# Patient Record
Sex: Female | Born: 1935
Health system: Southern US, Community
[De-identification: ages and names within clinical notes are randomized; demographics above are authoritative.]

## PROBLEM LIST (undated history)

## (undated) DIAGNOSIS — E876 Hypokalemia: Secondary | ICD-10-CM

## (undated) DIAGNOSIS — J310 Chronic rhinitis: Secondary | ICD-10-CM

## (undated) DIAGNOSIS — F329 Major depressive disorder, single episode, unspecified: Secondary | ICD-10-CM

## (undated) DIAGNOSIS — C801 Malignant (primary) neoplasm, unspecified: Secondary | ICD-10-CM

## (undated) DIAGNOSIS — I739 Peripheral vascular disease, unspecified: Secondary | ICD-10-CM

## (undated) DIAGNOSIS — E785 Hyperlipidemia, unspecified: Secondary | ICD-10-CM

## (undated) DIAGNOSIS — G4700 Insomnia, unspecified: Secondary | ICD-10-CM

## (undated) DIAGNOSIS — F419 Anxiety disorder, unspecified: Secondary | ICD-10-CM

## (undated) DIAGNOSIS — F0390 Unspecified dementia without behavioral disturbance: Secondary | ICD-10-CM

## (undated) DIAGNOSIS — K219 Gastro-esophageal reflux disease without esophagitis: Secondary | ICD-10-CM

## (undated) DIAGNOSIS — R609 Edema, unspecified: Secondary | ICD-10-CM

## (undated) DIAGNOSIS — D649 Anemia, unspecified: Secondary | ICD-10-CM

## (undated) DIAGNOSIS — I10 Essential (primary) hypertension: Secondary | ICD-10-CM

## (undated) DIAGNOSIS — G629 Polyneuropathy, unspecified: Secondary | ICD-10-CM

## (undated) DIAGNOSIS — I679 Cerebrovascular disease, unspecified: Secondary | ICD-10-CM

## (undated) DIAGNOSIS — J449 Chronic obstructive pulmonary disease, unspecified: Secondary | ICD-10-CM

## (undated) DIAGNOSIS — M545 Low back pain: Secondary | ICD-10-CM

## (undated) DIAGNOSIS — L03119 Cellulitis of unspecified part of limb: Secondary | ICD-10-CM

## (undated) DIAGNOSIS — L02419 Cutaneous abscess of limb, unspecified: Secondary | ICD-10-CM

## (undated) DIAGNOSIS — M1990 Unspecified osteoarthritis, unspecified site: Secondary | ICD-10-CM

## (undated) DIAGNOSIS — I639 Cerebral infarction, unspecified: Secondary | ICD-10-CM

## (undated) HISTORY — PX: JOINT REPLACEMENT: SHX530

## (undated) HISTORY — DX: Cutaneous abscess of limb, unspecified: L02.419

## (undated) HISTORY — DX: Major depressive disorder, single episode, unspecified: F32.9

## (undated) HISTORY — DX: Polyneuropathy, unspecified: G62.9

## (undated) HISTORY — DX: Hyperlipidemia, unspecified: E78.5

## (undated) HISTORY — DX: Chronic rhinitis: J31.0

## (undated) HISTORY — DX: Anemia, unspecified: D64.9

## (undated) HISTORY — DX: Cerebrovascular disease, unspecified: I67.9

## (undated) HISTORY — DX: Edema, unspecified: R60.9

## (undated) HISTORY — DX: Essential (primary) hypertension: I10

## (undated) HISTORY — DX: Cellulitis of unspecified part of limb: L03.119

## (undated) HISTORY — PX: BREAST SURGERY: SHX581

## (undated) HISTORY — DX: Unspecified osteoarthritis, unspecified site: M19.90

## (undated) HISTORY — DX: Peripheral vascular disease, unspecified: I73.9

## (undated) HISTORY — DX: Gastro-esophageal reflux disease without esophagitis: K21.9

## (undated) HISTORY — DX: Anxiety disorder, unspecified: F41.9

## (undated) HISTORY — DX: Hypokalemia: E87.6

## (undated) HISTORY — DX: Low back pain: M54.5

---

## 1999-12-14 DIAGNOSIS — J310 Chronic rhinitis: Secondary | ICD-10-CM

## 1999-12-14 HISTORY — DX: Chronic rhinitis: J31.0

## 2003-08-05 DIAGNOSIS — I739 Peripheral vascular disease, unspecified: Secondary | ICD-10-CM

## 2003-08-05 DIAGNOSIS — L02419 Cutaneous abscess of limb, unspecified: Secondary | ICD-10-CM

## 2003-08-05 DIAGNOSIS — L03119 Cellulitis of unspecified part of limb: Secondary | ICD-10-CM

## 2003-08-05 DIAGNOSIS — R609 Edema, unspecified: Secondary | ICD-10-CM

## 2003-08-05 DIAGNOSIS — E876 Hypokalemia: Secondary | ICD-10-CM

## 2003-08-05 DIAGNOSIS — M545 Low back pain, unspecified: Secondary | ICD-10-CM

## 2003-08-05 DIAGNOSIS — F419 Anxiety disorder, unspecified: Secondary | ICD-10-CM

## 2003-08-05 DIAGNOSIS — I679 Cerebrovascular disease, unspecified: Secondary | ICD-10-CM

## 2003-08-05 HISTORY — DX: Low back pain, unspecified: M54.50

## 2003-08-05 HISTORY — DX: Cellulitis of unspecified part of limb: L03.119

## 2003-08-05 HISTORY — DX: Anxiety disorder, unspecified: F41.9

## 2003-08-05 HISTORY — DX: Hypokalemia: E87.6

## 2003-08-05 HISTORY — DX: Peripheral vascular disease, unspecified: I73.9

## 2003-08-05 HISTORY — DX: Edema, unspecified: R60.9

## 2003-08-05 HISTORY — DX: Cutaneous abscess of limb, unspecified: L02.419

## 2003-08-05 HISTORY — DX: Cerebrovascular disease, unspecified: I67.9

## 2003-11-18 ENCOUNTER — Emergency Department (HOSPITAL_COMMUNITY): Admission: EM | Admit: 2003-11-18 | Discharge: 2003-11-18 | Payer: Self-pay | Admitting: Emergency Medicine

## 2004-01-06 ENCOUNTER — Ambulatory Visit (HOSPITAL_COMMUNITY): Admission: RE | Admit: 2004-01-06 | Discharge: 2004-01-06 | Payer: Self-pay | Admitting: Unknown Physician Specialty

## 2004-01-28 ENCOUNTER — Encounter (INDEPENDENT_AMBULATORY_CARE_PROVIDER_SITE_OTHER): Payer: Self-pay | Admitting: *Deleted

## 2004-01-28 ENCOUNTER — Ambulatory Visit (HOSPITAL_COMMUNITY): Admission: RE | Admit: 2004-01-28 | Discharge: 2004-01-28 | Payer: Self-pay | Admitting: Internal Medicine

## 2004-02-21 ENCOUNTER — Ambulatory Visit (HOSPITAL_COMMUNITY): Admission: RE | Admit: 2004-02-21 | Discharge: 2004-02-21 | Payer: Self-pay | Admitting: Gastroenterology

## 2004-03-11 ENCOUNTER — Emergency Department (HOSPITAL_COMMUNITY): Admission: EM | Admit: 2004-03-11 | Discharge: 2004-03-11 | Payer: Self-pay

## 2004-04-06 ENCOUNTER — Emergency Department (HOSPITAL_COMMUNITY): Admission: EM | Admit: 2004-04-06 | Discharge: 2004-04-06 | Payer: Self-pay | Admitting: Emergency Medicine

## 2004-04-15 ENCOUNTER — Ambulatory Visit (HOSPITAL_COMMUNITY): Admission: RE | Admit: 2004-04-15 | Discharge: 2004-04-15 | Payer: Self-pay | Admitting: Unknown Physician Specialty

## 2004-06-19 ENCOUNTER — Ambulatory Visit: Payer: Self-pay | Admitting: Internal Medicine

## 2004-08-23 ENCOUNTER — Emergency Department (HOSPITAL_COMMUNITY): Admission: EM | Admit: 2004-08-23 | Discharge: 2004-08-23 | Payer: Self-pay | Admitting: Emergency Medicine

## 2005-06-05 ENCOUNTER — Inpatient Hospital Stay (HOSPITAL_COMMUNITY): Admission: EM | Admit: 2005-06-05 | Discharge: 2005-06-10 | Payer: Self-pay | Admitting: Emergency Medicine

## 2005-12-01 ENCOUNTER — Ambulatory Visit (HOSPITAL_COMMUNITY): Admission: RE | Admit: 2005-12-01 | Discharge: 2005-12-01 | Payer: Self-pay | Admitting: *Deleted

## 2006-06-13 ENCOUNTER — Ambulatory Visit (HOSPITAL_COMMUNITY): Admission: RE | Admit: 2006-06-13 | Discharge: 2006-06-13 | Payer: Self-pay | Admitting: *Deleted

## 2006-07-20 ENCOUNTER — Ambulatory Visit (HOSPITAL_COMMUNITY): Admission: RE | Admit: 2006-07-20 | Discharge: 2006-07-20 | Payer: Self-pay | Admitting: Gastroenterology

## 2007-02-20 ENCOUNTER — Inpatient Hospital Stay (HOSPITAL_COMMUNITY): Admission: EM | Admit: 2007-02-20 | Discharge: 2007-02-24 | Payer: Self-pay | Admitting: Emergency Medicine

## 2007-07-21 ENCOUNTER — Ambulatory Visit (HOSPITAL_COMMUNITY): Admission: RE | Admit: 2007-07-21 | Discharge: 2007-07-21 | Payer: Self-pay | Admitting: *Deleted

## 2008-04-09 ENCOUNTER — Encounter: Admission: RE | Admit: 2008-04-09 | Discharge: 2008-04-09 | Payer: Self-pay | Admitting: Internal Medicine

## 2008-07-09 ENCOUNTER — Ambulatory Visit (HOSPITAL_COMMUNITY): Admission: RE | Admit: 2008-07-09 | Discharge: 2008-07-09 | Payer: Self-pay | Admitting: Orthopedic Surgery

## 2008-07-15 ENCOUNTER — Inpatient Hospital Stay (HOSPITAL_COMMUNITY): Admission: RE | Admit: 2008-07-15 | Discharge: 2008-07-18 | Payer: Self-pay | Admitting: Orthopedic Surgery

## 2008-07-18 DIAGNOSIS — G629 Polyneuropathy, unspecified: Secondary | ICD-10-CM

## 2008-07-18 DIAGNOSIS — C801 Malignant (primary) neoplasm, unspecified: Secondary | ICD-10-CM

## 2008-07-18 DIAGNOSIS — M1990 Unspecified osteoarthritis, unspecified site: Secondary | ICD-10-CM

## 2008-07-18 HISTORY — PX: EYE SURGERY: SHX253

## 2008-07-18 HISTORY — DX: Malignant (primary) neoplasm, unspecified: C80.1

## 2008-07-18 HISTORY — DX: Unspecified osteoarthritis, unspecified site: M19.90

## 2008-07-18 HISTORY — PX: FRACTURE SURGERY: SHX138

## 2008-07-18 HISTORY — DX: Polyneuropathy, unspecified: G62.9

## 2008-10-30 ENCOUNTER — Ambulatory Visit (HOSPITAL_COMMUNITY): Admission: RE | Admit: 2008-10-30 | Discharge: 2008-10-30 | Payer: Self-pay | Admitting: Internal Medicine

## 2009-03-15 HISTORY — PX: CAROTID ENDARTERECTOMY: SUR193

## 2009-04-30 ENCOUNTER — Encounter (INDEPENDENT_AMBULATORY_CARE_PROVIDER_SITE_OTHER): Payer: Self-pay | Admitting: *Deleted

## 2009-05-07 ENCOUNTER — Encounter: Admission: RE | Admit: 2009-05-07 | Discharge: 2009-05-07 | Payer: Self-pay | Admitting: Internal Medicine

## 2009-06-18 ENCOUNTER — Encounter: Admission: RE | Admit: 2009-06-18 | Discharge: 2009-06-18 | Payer: Self-pay | Admitting: Internal Medicine

## 2009-10-10 ENCOUNTER — Inpatient Hospital Stay (HOSPITAL_COMMUNITY): Admission: EM | Admit: 2009-10-10 | Discharge: 2009-10-12 | Payer: Self-pay | Admitting: Emergency Medicine

## 2009-10-11 ENCOUNTER — Encounter (INDEPENDENT_AMBULATORY_CARE_PROVIDER_SITE_OTHER): Payer: Self-pay | Admitting: Internal Medicine

## 2009-10-12 DIAGNOSIS — F32A Depression, unspecified: Secondary | ICD-10-CM

## 2009-10-12 HISTORY — DX: Depression, unspecified: F32.A

## 2009-12-02 ENCOUNTER — Inpatient Hospital Stay (HOSPITAL_COMMUNITY): Admission: EM | Admit: 2009-12-02 | Discharge: 2009-12-06 | Payer: Self-pay | Admitting: Emergency Medicine

## 2009-12-03 ENCOUNTER — Encounter (INDEPENDENT_AMBULATORY_CARE_PROVIDER_SITE_OTHER): Payer: Self-pay | Admitting: Internal Medicine

## 2009-12-04 ENCOUNTER — Ambulatory Visit: Payer: Self-pay | Admitting: Vascular Surgery

## 2009-12-04 ENCOUNTER — Encounter (INDEPENDENT_AMBULATORY_CARE_PROVIDER_SITE_OTHER): Payer: Self-pay | Admitting: Internal Medicine

## 2009-12-05 ENCOUNTER — Ambulatory Visit: Payer: Self-pay | Admitting: Internal Medicine

## 2009-12-05 ENCOUNTER — Encounter (INDEPENDENT_AMBULATORY_CARE_PROVIDER_SITE_OTHER): Payer: Self-pay | Admitting: Internal Medicine

## 2009-12-16 ENCOUNTER — Emergency Department (HOSPITAL_COMMUNITY): Admission: EM | Admit: 2009-12-16 | Discharge: 2009-12-16 | Payer: Self-pay | Admitting: Emergency Medicine

## 2009-12-24 ENCOUNTER — Ambulatory Visit: Payer: Self-pay | Admitting: Vascular Surgery

## 2010-04-05 ENCOUNTER — Encounter: Payer: Self-pay | Admitting: Internal Medicine

## 2010-04-05 ENCOUNTER — Encounter (HOSPITAL_BASED_OUTPATIENT_CLINIC_OR_DEPARTMENT_OTHER): Payer: Self-pay | Admitting: Internal Medicine

## 2010-04-14 NOTE — Letter (Signed)
Summary: New Patient letter  [facility]  [address] [place], [place] [address]   Phone: [phone]  Fax: [phone]       04/30/2009 MRN: [id number]  Dorothy Collins [address] [place], [place]  [address]  Dear Dorothy Collins,  Welcome to the Gastroenterology Division at [facility].    You are scheduled to see Dr.  Melvia Heaps on June 02, 2009 at 9:00am on the 3rd floor at [facility], [address].  We ask that you try to arrive at our office 15 minutes prior to your appointment time to allow for check-in.  We would like you to complete the enclosed self-administered evaluation form prior to your visit and bring it with you on the day of your appointment.  We will review it with you.  Also, please bring a complete list of all your medications or, if you prefer, bring the medication bottles and we will list them.  Please bring your insurance card so that we may make a copy of it.  If your insurance requires a referral to see a specialist, please bring your referral form from your primary care physician.  Co-payments are due at the time of your visit and may be paid by cash, check or credit card.     Your office visit will consist of a consult with your physician (includes a physical exam), any laboratory testing he/she may order, scheduling of any necessary diagnostic testing (e.g. x-ray, ultrasound, CT-scan), and scheduling of a procedure (e.g. Endoscopy, Colonoscopy) if required.  Please allow enough time on your schedule to allow for any/all of these possibilities.    If you cannot keep your appointment, please call [phone] to cancel or reschedule prior to your appointment date.  This allows [place] the opportunity to schedule an appointment for another patient in need of care.  If you do not cancel or reschedule by 5 p.m. the business day prior to your appointment date, you will be charged a $50.00 late cancellation/no-show fee.    Thank you for  choosing [facility] for your medical needs.  We appreciate the opportunity to care for you.  Please visit [place] at our website  to learn more about our practice.                     Sincerely,                                                             The Gastroenterology Division

## 2010-04-17 ENCOUNTER — Other Ambulatory Visit (HOSPITAL_BASED_OUTPATIENT_CLINIC_OR_DEPARTMENT_OTHER): Payer: Self-pay | Admitting: Internal Medicine

## 2010-04-17 DIAGNOSIS — Z1231 Encounter for screening mammogram for malignant neoplasm of breast: Secondary | ICD-10-CM

## 2010-05-04 ENCOUNTER — Encounter (INDEPENDENT_AMBULATORY_CARE_PROVIDER_SITE_OTHER): Payer: Medicaid Other

## 2010-05-04 ENCOUNTER — Encounter: Payer: Self-pay | Admitting: Internal Medicine

## 2010-05-04 DIAGNOSIS — R0609 Other forms of dyspnea: Secondary | ICD-10-CM

## 2010-05-04 DIAGNOSIS — R0989 Other specified symptoms and signs involving the circulatory and respiratory systems: Secondary | ICD-10-CM

## 2010-05-06 ENCOUNTER — Encounter: Payer: Self-pay | Admitting: Internal Medicine

## 2010-05-11 ENCOUNTER — Ambulatory Visit: Payer: Self-pay

## 2010-05-13 ENCOUNTER — Ambulatory Visit: Payer: Self-pay

## 2010-05-21 NOTE — Assessment & Plan Note (Signed)
Summary: rule out asthma-pft only- refer. dr Marijo Conception per traci at [facility]...    Other Orders: Carbon Monoxide diffusing w/capacity (98119) Lung Volumes/Gas dilution or washout (14782) Spirometry (Pre & Post) [phone])

## 2010-05-27 LAB — URINALYSIS, ROUTINE W REFLEX MICROSCOPIC
Bilirubin Urine: NEGATIVE
Ketones, ur: NEGATIVE mg/dL
Nitrite: POSITIVE — AB
Protein, ur: NEGATIVE mg/dL
Urobilinogen, UA: 0.2 mg/dL (ref 0.0–1.0)

## 2010-05-27 LAB — URINE CULTURE
Colony Count: >=100000
Culture  Setup Time: 201110050116

## 2010-05-27 LAB — DIFFERENTIAL
Basophils Absolute: 0 10*3/uL (ref 0.0–0.1)
Eosinophils Absolute: 0.2 10*3/uL (ref 0.0–0.7)
Eosinophils Relative: 2 % (ref 0–5)
Lymphocytes Relative: 14 % (ref 12–46)
Neutrophils Relative %: 82 % — ABNORMAL HIGH (ref 43–77)

## 2010-05-27 LAB — BASIC METABOLIC PANEL
BUN: 7 mg/dL (ref 6–23)
Calcium: 9.4 mg/dL (ref 8.4–10.5)
Chloride: 98 mEq/L (ref 96–112)
Creatinine, Ser: 0.52 mg/dL (ref 0.4–1.2)

## 2010-05-27 LAB — CBC
MCH: 30.8 pg (ref 26.0–34.0)
MCV: 89.6 fL (ref 78.0–100.0)
Platelets: 409 10*3/uL — ABNORMAL HIGH (ref 150–400)
RDW: 12.6 % (ref 11.5–15.5)
WBC: 9.9 10*3/uL (ref 4.0–10.5)

## 2010-05-27 LAB — SEDIMENTATION RATE: Sed Rate: 28 mm/hr — ABNORMAL HIGH (ref 0–22)

## 2010-05-28 LAB — URINALYSIS, ROUTINE W REFLEX MICROSCOPIC
Glucose, UA: NEGATIVE mg/dL
Hgb urine dipstick: NEGATIVE
Hgb urine dipstick: NEGATIVE
Ketones, ur: NEGATIVE mg/dL
Nitrite: NEGATIVE
Protein, ur: NEGATIVE mg/dL
Protein, ur: NEGATIVE mg/dL
Specific Gravity, Urine: 1.007 (ref 1.005–1.030)
Urobilinogen, UA: 0.2 mg/dL (ref 0.0–1.0)

## 2010-05-28 LAB — COMPREHENSIVE METABOLIC PANEL
AST: 26 U/L (ref 0–37)
Albumin: 3.6 g/dL (ref 3.5–5.2)
BUN: 10 mg/dL (ref 6–23)
CO2: 29 mEq/L (ref 19–32)
Calcium: 8.8 mg/dL (ref 8.4–10.5)
Creatinine, Ser: 0.63 mg/dL (ref 0.4–1.2)
Creatinine, Ser: 0.75 mg/dL (ref 0.4–1.2)
GFR calc Af Amer: >60 mL/min (ref >60)
GFR calc non Af Amer: >60 mL/min (ref >60)
Glucose, Bld: 90 mg/dL (ref 70–99)
Total Bilirubin: 0.2 mg/dL — ABNORMAL LOW (ref 0.3–1.2)
Total Protein: 6.2 g/dL (ref 6.0–8.3)
Total Protein: 6.5 g/dL (ref 6.0–8.3)

## 2010-05-28 LAB — CK TOTAL AND CKMB (NOT AT ARMC)
CK, MB: 1.5 ng/mL (ref 0.3–4.0)
Relative Index: INVALID (ref 0.0–2.5)
Relative Index: INVALID (ref 0.0–2.5)
Total CK: 38 U/L (ref 7–177)
Total CK: 43 U/L (ref 7–177)

## 2010-05-28 LAB — MRSA PCR SCREENING: MRSA by PCR: NEGATIVE

## 2010-05-28 LAB — BASIC METABOLIC PANEL
BUN: 3 mg/dL — ABNORMAL LOW (ref 6–23)
CO2: 30 mEq/L (ref 19–32)
CO2: 31 mEq/L (ref 19–32)
Calcium: 8.5 mg/dL (ref 8.4–10.5)
Calcium: 8.8 mg/dL (ref 8.4–10.5)
Creatinine, Ser: 0.49 mg/dL (ref 0.4–1.2)
GFR calc Af Amer: >60 mL/min (ref >60)
GFR calc Af Amer: >60 mL/min (ref >60)
GFR calc non Af Amer: >60 mL/min (ref >60)
GFR calc non Af Amer: >60 mL/min (ref >60)
GFR calc non Af Amer: >60 mL/min (ref >60)
Glucose, Bld: 106 mg/dL — ABNORMAL HIGH (ref 70–99)
Glucose, Bld: 145 mg/dL — ABNORMAL HIGH (ref 70–99)
Potassium: 4.1 mEq/L (ref 3.5–5.1)
Sodium: 136 mEq/L (ref 135–145)
Sodium: 137 mEq/L (ref 135–145)

## 2010-05-28 LAB — CBC
HCT: 37.5 % (ref 36.0–46.0)
HCT: 38.6 % (ref 36.0–46.0)
Hemoglobin: 12.3 g/dL (ref 12.0–15.0)
MCH: 30.3 pg (ref 26.0–34.0)
MCHC: 33.3 g/dL (ref 30.0–36.0)
MCHC: 33.9 g/dL (ref 30.0–36.0)
MCV: 87.9 fL (ref 78.0–100.0)
Platelets: 224 10*3/uL (ref 150–400)
Platelets: 240 10*3/uL (ref 150–400)
RBC: 4.39 MIL/uL (ref 3.87–5.11)
RDW: 12.6 % (ref 11.5–15.5)
RDW: 12.6 % (ref 11.5–15.5)
RDW: 12.6 % (ref 11.5–15.5)
WBC: 7.2 10*3/uL (ref 4.0–10.5)
WBC: 7.5 10*3/uL (ref 4.0–10.5)

## 2010-05-28 LAB — URINE CULTURE
Colony Count: NO GROWTH
Culture: NO GROWTH

## 2010-05-28 LAB — DIFFERENTIAL
Basophils Absolute: 0.1 10*3/uL (ref 0.0–0.1)
Lymphocytes Relative: 29 % (ref 12–46)
Lymphocytes Relative: 30 % (ref 12–46)
Lymphs Abs: 2.6 10*3/uL (ref 0.7–4.0)
Monocytes Absolute: 0.5 10*3/uL (ref 0.1–1.0)
Monocytes Relative: 7 % (ref 3–12)
Neutro Abs: 4.1 10*3/uL (ref 1.7–7.7)
Neutrophils Relative %: 58 % (ref 43–77)

## 2010-05-28 LAB — TROPONIN I: Troponin I: 0.02 ng/mL (ref 0.00–0.06)

## 2010-05-28 LAB — PROTIME-INR
INR: 0.92 (ref 0.00–1.49)
INR: 0.93 (ref 0.00–1.49)
Prothrombin Time: 12.6 seconds (ref 11.6–15.2)
Prothrombin Time: 12.7 seconds (ref 11.6–15.2)

## 2010-05-28 LAB — GLUCOSE, CAPILLARY
Glucose-Capillary: 105 mg/dL — ABNORMAL HIGH (ref 70–99)
Glucose-Capillary: 150 mg/dL — ABNORMAL HIGH (ref 70–99)
Glucose-Capillary: 155 mg/dL — ABNORMAL HIGH (ref 70–99)
Glucose-Capillary: 74 mg/dL (ref 70–99)
Glucose-Capillary: 97 mg/dL (ref 70–99)

## 2010-05-28 LAB — LIPID PANEL
Cholesterol: 231 mg/dL — ABNORMAL HIGH (ref 0–200)
LDL Cholesterol: 114 mg/dL — ABNORMAL HIGH (ref 0–99)
Triglycerides: 332 mg/dL — ABNORMAL HIGH (ref <150)

## 2010-05-28 LAB — TYPE AND SCREEN

## 2010-05-28 LAB — URINE MICROSCOPIC-ADD ON

## 2010-05-30 LAB — CARDIAC PANEL(CRET KIN+CKTOT+MB+TROPI)
CK, MB: 3.5 ng/mL (ref 0.3–4.0)
CK, MB: 4.4 ng/mL — ABNORMAL HIGH (ref 0.3–4.0)
Relative Index: 3.3 — ABNORMAL HIGH (ref 0.0–2.5)
Total CK: 134 U/L (ref 7–177)
Total CK: 91 U/L (ref 7–177)
Troponin I: 0.02 ng/mL (ref 0.00–0.06)

## 2010-05-30 LAB — TSH: TSH: 4.003 u[IU]/mL (ref 0.350–4.500)

## 2010-05-30 LAB — POCT CARDIAC MARKERS
CKMB, poc: <1 ng/mL — ABNORMAL LOW (ref 1.0–8.0)
Myoglobin, poc: 30.4 ng/mL (ref 12–200)
Troponin i, poc: <0.05 ng/mL (ref 0.00–0.09)

## 2010-05-30 LAB — CBC
HCT: 40.2 % (ref 36.0–46.0)
Hemoglobin: 12.7 g/dL (ref 12.0–15.0)
Hemoglobin: 13.6 g/dL (ref 12.0–15.0)
MCH: 31.9 pg (ref 26.0–34.0)
MCH: 32.1 pg (ref 26.0–34.0)
MCHC: 33.9 g/dL (ref 30.0–36.0)
MCHC: 34 g/dL (ref 30.0–36.0)
MCV: 94.3 fL (ref 78.0–100.0)
Platelets: 219 10*3/uL (ref 150–400)
Platelets: 227 10*3/uL (ref 150–400)
RBC: 3.97 MIL/uL (ref 3.87–5.11)
RBC: 4.26 MIL/uL (ref 3.87–5.11)
RDW: 13 % (ref 11.5–15.5)
WBC: 13.2 10*3/uL — ABNORMAL HIGH (ref 4.0–10.5)

## 2010-05-30 LAB — DIFFERENTIAL
Basophils Absolute: 0 10*3/uL (ref 0.0–0.1)
Basophils Relative: 0 % (ref 0–1)
Eosinophils Absolute: 0.2 10*3/uL (ref 0.0–0.7)
Eosinophils Relative: 2 % (ref 0–5)
Lymphocytes Relative: 11 % — ABNORMAL LOW (ref 12–46)
Lymphs Abs: 1.4 10*3/uL (ref 0.7–4.0)
Monocytes Absolute: 0.6 10*3/uL (ref 0.1–1.0)
Monocytes Relative: 5 % (ref 3–12)
Neutro Abs: 10.9 10*3/uL — ABNORMAL HIGH (ref 1.7–7.7)
Neutrophils Relative %: 83 % — ABNORMAL HIGH (ref 43–77)

## 2010-05-30 LAB — BASIC METABOLIC PANEL
BUN: 9 mg/dL (ref 6–23)
CO2: 30 mEq/L (ref 19–32)
CO2: 31 mEq/L (ref 19–32)
Calcium: 8.5 mg/dL (ref 8.4–10.5)
Calcium: 8.8 mg/dL (ref 8.4–10.5)
Creatinine, Ser: 0.62 mg/dL (ref 0.4–1.2)
Creatinine, Ser: 0.66 mg/dL (ref 0.4–1.2)
GFR calc Af Amer: >60 mL/min (ref >60)
GFR calc non Af Amer: >60 mL/min (ref >60)
GFR calc non Af Amer: >60 mL/min (ref >60)
Glucose, Bld: 94 mg/dL (ref 70–99)
Sodium: 136 mEq/L (ref 135–145)

## 2010-05-30 LAB — TROPONIN I: Troponin I: 0.02 ng/mL (ref 0.00–0.06)

## 2010-05-30 LAB — LIPID PANEL
LDL Cholesterol: 58 mg/dL (ref 0–99)
VLDL: 27 mg/dL (ref 0–40)

## 2010-05-30 LAB — HEMOGLOBIN A1C
Hgb A1c MFr Bld: 5.9 % — ABNORMAL HIGH (ref <5.7)
Mean Plasma Glucose: 123 mg/dL — ABNORMAL HIGH (ref <117)

## 2010-05-30 LAB — D-DIMER, QUANTITATIVE: D-Dimer, Quant: 0.27 ug/mL-FEU (ref 0.00–0.48)

## 2010-05-30 LAB — CK TOTAL AND CKMB (NOT AT ARMC)
CK, MB: 1.3 ng/mL (ref 0.3–4.0)
Relative Index: INVALID (ref 0.0–2.5)
Total CK: 37 U/L (ref 7–177)

## 2010-06-23 LAB — BASIC METABOLIC PANEL
CO2: 27 mEq/L (ref 19–32)
Calcium: 7.4 mg/dL — ABNORMAL LOW (ref 8.4–10.5)
Calcium: 7.7 mg/dL — ABNORMAL LOW (ref 8.4–10.5)
Chloride: 103 mEq/L (ref 96–112)
Creatinine, Ser: 0.59 mg/dL (ref 0.4–1.2)
GFR calc Af Amer: >60 mL/min (ref >60)
GFR calc Af Amer: >60 mL/min (ref >60)
Potassium: 3.6 mEq/L (ref 3.5–5.1)
Sodium: 136 mEq/L (ref 135–145)
Sodium: 136 mEq/L (ref 135–145)

## 2010-06-23 LAB — CBC
HCT: 30.5 % — ABNORMAL LOW (ref 36.0–46.0)
Hemoglobin: 10.3 g/dL — ABNORMAL LOW (ref 12.0–15.0)
Hemoglobin: 9.8 g/dL — ABNORMAL LOW (ref 12.0–15.0)
MCHC: 33.6 g/dL (ref 30.0–36.0)
MCV: 88.3 fL (ref 78.0–100.0)
RBC: 3.31 MIL/uL — ABNORMAL LOW (ref 3.87–5.11)
RBC: 3.46 MIL/uL — ABNORMAL LOW (ref 3.87–5.11)
WBC: 7.5 10*3/uL (ref 4.0–10.5)

## 2010-06-23 LAB — TYPE AND SCREEN: ABO/RH(D): O NEG

## 2010-06-24 LAB — URINALYSIS, ROUTINE W REFLEX MICROSCOPIC
Bilirubin Urine: NEGATIVE
Glucose, UA: NEGATIVE mg/dL
Ketones, ur: NEGATIVE mg/dL
pH: 5.5 (ref 5.0–8.0)

## 2010-06-24 LAB — BASIC METABOLIC PANEL
BUN: 7 mg/dL (ref 6–23)
CO2: 27 mEq/L (ref 19–32)
Calcium: 9.4 mg/dL (ref 8.4–10.5)
Glucose, Bld: 99 mg/dL (ref 70–99)
Sodium: 135 mEq/L (ref 135–145)

## 2010-06-24 LAB — DIFFERENTIAL
Basophils Absolute: 0 10*3/uL (ref 0.0–0.1)
Basophils Relative: 1 % (ref 0–1)
Eosinophils Relative: 2 % (ref 0–5)
Monocytes Absolute: 0.6 10*3/uL (ref 0.1–1.0)
Neutro Abs: 5 10*3/uL (ref 1.7–7.7)

## 2010-06-24 LAB — TYPE AND SCREEN
ABO/RH(D): O NEG
Antibody Screen: NEGATIVE

## 2010-06-24 LAB — APTT: aPTT: 30 seconds (ref 24–37)

## 2010-06-24 LAB — URINE MICROSCOPIC-ADD ON

## 2010-06-24 LAB — CBC
Hemoglobin: 12.8 g/dL (ref 12.0–15.0)
MCHC: 33.3 g/dL (ref 30.0–36.0)
RDW: 16.1 % — ABNORMAL HIGH (ref 11.5–15.5)

## 2010-07-01 ENCOUNTER — Other Ambulatory Visit: Payer: Self-pay

## 2010-07-01 ENCOUNTER — Ambulatory Visit: Payer: Self-pay | Admitting: Vascular Surgery

## 2010-07-28 NOTE — Op Note (Signed)
NAMEMarland Kitchen  JUDIT, AWAD NO.:  [id number]   MEDICAL RECORD NO.:  [id number]          PATIENT TYPE:  INP   LOCATION:  1616                         FACILITY:  [facility]   PHYSICIAN:  Madlyn Frankel. Charlann Boxer, M.D.  DATE OF BIRTH:  05-06-1930   DATE OF PROCEDURE:  07/15/2008  DATE OF DISCHARGE:                               OPERATIVE REPORT   PREOPERATIVE DIAGNOSIS:  Right knee osteoarthritis with a fixed flexion  valgus deformity less than 20 degrees combined deformity.   POSTOPERATIVE DIAGNOSIS:  Right knee osteoarthritis with a fixed flexion  valgus deformity less than 20 degrees combined deformity.   PROCEDURE:  Right total knee replacement.   COMPONENTS USED:  DePuy rotating platform posterior stabilized knee  system size 3 femur, 3 tibia, 10 mm and posterior stabilized insert and  35 patellar button.   SURGEON:  Dr. Charlann Boxer.   ASSISTANT:  Dwyane Luo.   ANESTHESIA:  Spinal.   TOURNIQUET TIME:  35 minutes at 250 mmHg.   COMPLICATIONS:  None.   DRAINS:  One Hemovac.   SPECIMENS:  None.   INDICATIONS OF THE PROCEDURE:  Ms. Bernardi is a 77 year old female known  to me for management of a hip tissue in the past.  She had been followed  for chronic bilateral knee arthritis and pain.  She had somewhat had  gotten deconditioned due to her knees.  After her successful recovery  from her hip surgery she wishes to proceed with knee replacement surgery  to improve her overall quality of life. She was set up with some  preoperative physical therapy.  She managed to improved some of her  strength and mobility limited only by her pain.  Risks of infection,  DVT, component failure, need for revision surgery including manipulation  were all discussed and reviewed.  Her biggest issue at this point I  think is her decondition state and the amount of recovery it is going to  take.   Following discussion of the risks and benefits consent was obtained for  benefit of pain relief.   PROCEDURE IN DETAIL:  The patient was brought to operative theater.  Once adequate anesthesia, preoperative antibiotics, Ancef administered,  the patient was positioned supine on the operative table. Thigh  tourniquet was placed.   The right lower extremity was then pre-scrubbed, prepped and draped in a  sterile fashion.  Time-out was performed identifying the patient,  planned procedure and the extremity.  The [facility] leg holder was  utilized.   The right lower extremity was then exsanguinated, tourniquet elevated to  250 mmHg.   The knee was brought to flexion and a midline incision was made followed  by dissection of the extensor mechanism.  A median arthrotomy was then  made following initial debridement. Attention was directed to the  patella. Precut measurement was 23 mm.  I resected down to 14 mm and  used a 35 patellar button which fit the cut surface best.   Retractors were now placed for the femoral preparation.  Femoral canal  was opened. The drill irrigated to prevent fat emboli.  Based on the  flexion contracture of approximately 10 degrees I resected 12 mm of bone  off the distal femur at 3 degrees of valgus due her small stature.   Following this cut I subluxated the tibia anteriorly.  Radiographically  she did not have significantly abnormal lateral proximal tibia and I  resected 10 mm of bone off the proximal lateral tibia.  Following this  resection, we found that she still a bit tight in extension but flexion  gap appeared to be balanced.   At this point I went ahead and returned to the femur.  The cut surface  of the tibia was noted be perpendicular in both planes, particularly the  mediolateral.  I sized the femur to be a size 3.  The size 3 rotation  block was then pinned into position using the C clamp based off the  proximal cut of the tibia.  I confirmed the height was appropriate with  no notching.  Anterior and posterior chamfer cuts were all then made   without notching or complication.  Final box cut was made on the lateral  aspect of the distal femur.   Attention was redirected to the tibia and tibia subluxated anteriorly.  The best fit on the cut surface was a size 3.  The tray was pinned  through the medial third of tubercle, drilled and keel punched.  Trial  reduction was now carried out with a 3 femur, 3 tibia, and 10 mm insert.  These were inserted into place. The flexion again felt good in  extension, however she lacked a few degrees due to fixed flexion valgus  deformity and the fact that I had already removed 12 mm of bone off the  distal femur I was accepting of this.   At this point the trial components removed. The knee was injected in the  synovial capsule layer with 60 mL of 0.25% Marcaine with epinephrine and  1 mL of Toradol.  The knee was irrigated with normal saline pulse  lavage.  Final components were opened and cement mixed.  Final  components were cemented into position and the knee was brought to  extension with a 10 mm trial insert. Excessive cement was removed.  Once  cement had cured excessive cement was removed throughout the knee. Once  I was satisfied I was unable to visualize any remaining cement. The  final 10-mm insert matched and the 3 femur was inserted. The knee was re-  irrigated and a medium Hemovac drain was placed deep.  The tourniquet  had been let down at 35 minutes of 250 mmHg. Extensor mechanism was then  reapproximated in flexion using #1 Vicryl.  The remaining wound was  closed in layers with 2-0 Vicryl and running 4-0 Monocryl.  The knee was  then cleaned, dried and dressed sterilely with Steri-Strips and a bulky  sterile wrap.  She was brought to recovery room in stable condition  tolerating the procedure well.      Madlyn Frankel Charlann Boxer, M.D.  Electronically Signed     MDO/MEDQ  D:  07/15/2008  T:  07/15/2008  Job:  [id number]

## 2010-07-28 NOTE — Discharge Summary (Signed)
NAME:  Dorothy Collins, Dorothy Collins NO.:  [id number]   MEDICAL RECORD NO.:  [id number]          PATIENT TYPE:  INP   LOCATION:  1619                         FACILITY:  [facility]   PHYSICIAN:  Hillery Aldo, M.D.   DATE OF BIRTH:  Sep 21, 1930   DATE OF ADMISSION:  02/20/2007  DATE OF DISCHARGE:  02/24/2007                               DISCHARGE SUMMARY   PRIMARY CARE PHYSICIAN:  Encarnacion Slates. Hyacinth Meeker, MD with [facility].   ORTHOPEDIC SURGEON:  Madlyn Frankel. Charlann Boxer, M.D.   DISCHARGE DIAGNOSES:  1. Right femoral neck fracture status post hemiarthroplasty.  2. Peripheral vascular disease.  3. Chronic obstructive pulmonary disease.  4. Cerebrovascular disease.  5. Dementia.  6. A 9 mm right upper lobe lung nodule, outpatient CT recommended for      further evaluation.  7. Hypokalemia.  8. Acute blood loss anemia.  9. History of depression.  10.History of gastroesophageal reflux disease   DISCHARGE MEDICATIONS:  1. Pletal 50 mg b.i.d.  2. Advair 250/50 1 inhalation b.i.d.  3. Os-Cal 500 mg b.i.d.  4. Librium 20 mg q.a.m. and bedtime.  5. Librium 10 mg at 2:00 p.m.  6. Namenda 10 mg b.i.d.  7. Albuterol 2.5 mg by handheld nebulizer q.i.d.  8. Zyprexa 7.5 mg nightly.  9. Patanol eye drops 1 drop in each eye b.i.d. p.r.n.  10.Lidoderm patches to the back at 8 p.m.  11.Ultram 50 mg p.o. q.i.d. p.r.n.  12.Celebrex 50 mg p.o. b.i.d.  13.Fioricet 325 mg p.o. 8 a.m., 2 p.m., and 6 p.m.  14.Nexium 40 mg daily.  15.Spiriva 1 inhalation daily.  16.Miacalcin nasal 1 spray in alternating nostrils daily.  17.Lasix 80 mg daily.  18.Potassium chloride 40 mEq daily.  19.Cymbalta 90 mg daily.  20.Lovenox 30 mg subcutaneously daily x11 more days; then enteric-      coated aspirin 325 mg daily, thereafter x4 weeks.  21.Robaxin 500 mg q.6 h. p.r.n. muscle spasm.  22.Colace 100 mg p.o. b.i.d. p.r.n. constipation.  23.MiraLax 17 grams p.o. daily p.r.n. constipation.  24.Vicodin 5/325  1-2 tablets q.4 h. p.r.n. pain.  25.Iron 325 mg t.i.d. x2 weeks.   BRIEF ADMISSION HPI:  The patient is a 77 year old female residing at  [facility] who experienced a fall approximately 1 week ago.  She  was in a considerable amount of pain after the fall, and subsequently  came to the emergency department for evaluation of persistent pain on  February 20, 2007.  Upon evaluation, she was found to have a right  femoral neck fracture.  She was, therefore, admitted for surgical  consultation and repair of her hip fracture.  For the full details,  please see the dictated report done by Dr. Angelena Sole.   CONSULTATIONS:  Dr. Charlann Boxer of orthopedic surgery.   PROCEDURES AND DIAGNOSTIC STUDIES:  1. Chest x-ray on February 20, 2007 showed a possible 9 mm right upper      lobe nodule.  CT scan is recommended for further evaluation.  This      should be done as an outpatient.  2. Right  hip films on February 20, 2007 showed a right femoral neck      fracture.  3. Pelvic films on February 21, 2007 status post hip hemiarthroplasty      showed the right hip hemiarthroplasty without complicating      features.   DISCHARGE LABORATORY VALUES:  Sodium was 133, potassium 3.6, chloride  103, bicarb 26, BUN 5, creatinine 0.50, glucose 126.  White blood cell  count was 6.0, hemoglobin 9.1, hematocrit 26.7, platelets 246.   HOSPITAL COURSE BY PROBLEM:  Problem #1:  RIGHT FEMORAL NECK FRACTURE.  The patient was admitted and put on pain control therapies.  She was  seen in consultation with the orthopedic surgeons, specifically Dr.  Rennis Chris and Dr. Charlann Boxer.  She was cleared medically for surgical  intervention, and underwent a right hip hemiarthroplasty on February 21, 2007 by Dr. Cornelius Moras.  Her postoperative course has been uncomplicated  medically although she is having a significant amount of pain and muscle  spasm in the groin area.  She will need ongoing physical therapy in the  outpatient environment to restore  her mobility.   Problem #2:  HYPOKALEMIA.  The patient's potassium was appropriately  repleted prior to discharge.   Problem #3:  ACUTE BLOOD LOSS ANEMIA.  The patient did have some  postoperative anemia.  Her hemoglobin has remained stable,. and she has  been put on iron supplement therapy for 2 weeks' time.   Problem #4:  Right Upper Lobe Lung Nodule.  Incidentally found 9 mm lung  nodule noted on chest radiography.  A CT scan is recommended for further  evaluation of this nodule to be done as an outpatient.   Problem #5:  DEMENTIA.  The patient has remained stable.   Problem #6:  CEREBROVASCULAR DISEASE.  The patient has remained stable.   Problem #7:  CHRONIC OBSTRUCTIVE PULMONARY DISEASE.  The patient's  respiratory status has remained stable.  She continues on low-flow  oxygen, nebulized bronchodilator therapy, and incentive spirometry every  hour while awake.   DISPOSITION:  The patient is medically stable for discharge back to  [facility].  She should continue to receive physical therapy in  her facility.  She should have an outpatient CT scan of her lungs to  followup the lung nodule.  She should follow up with Dr. Charlann Boxer in 2 weeks  for a wound check.  An appointment can be scheduled by calling [phone].  She should follow up with her primary care physician early next week.      Hillery Aldo, M.D.  Electronically Signed     CR/MEDQ  D:  02/24/2007  T:  02/24/2007  Job:  [id number]   cc:   Frederica Kuster, MD  Fax: [phone]   Madlyn Frankel. Charlann Boxer, M.D.  Fax: [phone]

## 2010-07-28 NOTE — Op Note (Signed)
NAMEMarland Kitchen  NAIMAH, YINGST NO.:  [id number]   MEDICAL RECORD NO.:  [id number]          PATIENT TYPE:  INP   LOCATION:  1619                         FACILITY:  [facility]   PHYSICIAN:  Madlyn Frankel. Charlann Boxer, M.D.  DATE OF BIRTH:  April 13, 1930   DATE OF PROCEDURE:  02/21/2007  DATE OF DISCHARGE:                               OPERATIVE REPORT   PREOPERATIVE DIAGNOSIS:  Displaced right femoral neck fracture.   POSTOPERATIVE DIAGNOSIS:  Displaced right femoral neck fracture.   PROCEDURE:  Right hip hemiarthroplasty utilizing a TriLock size 4  standard stem with a 46 ball minus 3 adapter   SURGEON:  Madlyn Frankel. Charlann Boxer, M.D.   ASSISTANT:  Yetta Glassman. Mann, P.A.-C.   ANESTHESIA:  General.   BLOOD LOSS:  200 mL.   DRAINS:  None.   COMPLICATIONS:  None.   INDICATIONS FOR THE PROCEDURE:  This is an elderly 77 year old 2-liter  oxygen-dependent female who fell at a nursing home.  She has fairly bad  knees per her report limiting her functional mobility.  She lost her  balance and fell.  This occurred on Saturday.  It was not until Monday  when she was brought to emergency room and evaluated and noted to have a  displaced femoral neck fracture.  Apparently, she had had some  radiographs that indicated no fracture, and it may very well have  displaced her completed.  Nonetheless, she was seen and admitted to the  medical service due to medical conditions.  Orthopedics was consulted.  She was initially seen and evaluated by Dr. Francena Hanly who asked for  my assistance for definitive management.  I reviewed the patient's chart  and discussed with the patient and her daughter her fracture pattern and  the planned procedure.  Plan was for hemiarthroplasty due to her limited  mobility as opposed to performing a total hip replacement.  Consent was  obtained.   PROCEDURE IN DETAIL:  The patient was brought to the operative theater.  Once adequate anesthesia and perioperative antibiotics  were administered  including Rocephin, the patient was positioned in the left lateral  decubitus position with the right side up.  Right lower extremity was  then prepped and draped in a sterile fashion.  Lateral-based incision  was made for posterior approach to the hip.  The iliotibial band and  gluteus fascia were incised posteriorly.  Short external rotators were  taken down separate from the posterior capsule.  An L capsulotomy was  made preserving the posterior capsule for protection against the sciatic  nerve but also for later repair at the end of the case.  Hip fracture  was identified.  Femur was rotated around to allow for removal of the  femoral head using a corkscrew.  This was a subcapital fracture.  Retractor was placed and a neck osteotomy was made into the trochanteric  fossa.  At this point, we began with femoral preparation with a starting  drill, hand reaming and broaching.  I broached up to a size 4 broach  with an excellent purchase.  A trial reduction  was carried using the 46-  mm head as this was the size of the head that was removed.  I used a  minus 3 adaptor.  The hip length appeared to be stable compared to her  down leg.  Hip range of motion was excellent without evidence of  impingement.  At this point, the hip was dislocated.  Trial components  were removed.  The final size 4 TriLock stem was then impacted.  The  standard stem was impacted at the level of the neck cut.  The minus 3  was fit into the 46 ball, and it was impacted onto a dried trunnion.  Hip was irrigated throughout the case and again at this point.  I  reapproximated the posterior capsule with superior leaflet using #1  Ethibond.  The iliotibial band and gluteus fascia was closed using #1  Vicryl.  The remaining limb was closed in layers with 2-0 Vicryl and  running 4-0 Monocryl.  The patient's hip was cleaned, dried and dressed  sterilely with Steri-Strips and a Mepilex dressing.  She was  brought to  the recovery room, extubated, and tolerated the procedure without  complication.      Madlyn Frankel Charlann Boxer, M.D.  Electronically Signed     MDO/MEDQ  D:  02/21/2007  T:  02/22/2007  Job:  [id number]

## 2010-07-28 NOTE — Consult Note (Signed)
NAME:  Dorothy Collins, Dorothy Collins NO.:  [id number]   MEDICAL RECORD NO.:  [id number]          PATIENT TYPE:  EMS   LOCATION:  ED                           FACILITY:  [facility]   PHYSICIAN:  Hettie Holstein, D.O.    DATE OF BIRTH:  04-15-1930   DATE OF CONSULTATION:  02/20/2007  DATE OF DISCHARGE:                                 CONSULTATION   PRIMARY CARE PHYSICIAN:  Jacalyn Lefevre, M.D., [facility].   SKILLED NURSING FACILITY:  [facility].   REQUESTING PHYSICIAN:  Vania Rea. Supple, M.D. of orthopedic surgery.   CHIEF COMPLAINT:  Hip pain.   REASON FOR CONSULTATION:  Medical evaluation for management and fever.   HISTORY OF PRESENT ILLNESS:  Dorothy Collins is a 77 year old resident of  [facility] who is typically wheelchair ambulatory.  She had been  able to ambulate by holding onto the back of a wheelchair up until about  3 weeks ago.  She states that Saturday she attempted to make it to the  bathroom and simply fell on her butt.  She had a considerable amount of  pain.  She was not clear whether or not she had sustained a fracture.  In any event, she presented today from [facility] and was  discovered radiographically to have a right femoral neck fracture.  Dr.  Rennis Chris and Dr. Rubin Payor had conversed and requested our assistance in  medical clearance and evaluation.   In the emergency department, she is comfortable when remaining still;  however, with any movement, she has considerable amounts of pain in her  right hip and groin.  Even with moving her left lower extremity, she  definitely has some reflexive pain in her right hip with this subtle  movement.  We are waiting on Dr. Dub Mikes evaluation as to the extent of  injury and timing of surgery.   PAST MEDICAL HISTORY:  1. Pneumonia and in March of 2007.  2. Dementia.  3. Anemia of chronic disease.  4. COPD/O2 dependent.  5. Gastroesophageal reflux disease.  6. History of CVA.  7. Peripheral  vascular disease.  8. Depression.   She does carry and maintaining a no code blue status.   MEDICATIONS:  As provided by a list sent from [facility], she  takes:  1. Pletal 50 mg p.o. b.i.d.  2. Advair 250/50 b.i.d.  3. Os-Cal 500 b.i.d.  4. Librium 20 mg q.a.m. and q.h.s.  5. Librium 10 mg at 2:00 p.m.  6. Namenda 10 mg b.i.d.  7. Albuterol nebulized q.i.d.  8. Zyprexa 7.5 mg q.h.s.  9. Patanol eye drops twice a day as needed.  10.Lidoderm patches applied to the back q. 8 p.m.  11.Ultram 50 mg p.o. q.i.d. p.r.n.  12.Celebrex 50 mg p.o. b.i.d.  13.Fioricet 325 p.o. q 8, 2 and 6.  14.Nexium 40 mg p.o. daily.  15.Spiriva inhaled daily.  16.Miacalcin NAS daily.  17.Lasix 80 mg q.a.m.  18.Potassium 40 mEq daily.  19.Cymbalta 90 mg daily.   ALLERGIES:  1. PENICILLIN.  2. SULFA.   SOCIAL HISTORY:  She  is a former smoker who quit several years ago.  She  denies alcohol.  She is a current resident of [facility].  She  comes with an out-of-facility no code blue signed.  Primary contact is  Dorothy Collins, [phone].   REVIEW OF SYSTEMS:  Dorothy Collins reports that she has had some pain in her  heels, though this is most recently due to supine positioning and  restricted movement related to her recently fractured hip.  She denies  any diarrhea, chest pain or shortness of breath.  No nausea, vomiting or  diarrhea.  No cough.  She has had a recent urinary infection with a  resistant strain of E-coli.  I could not see where this was treated.  I  do see the culture results.  She does have progressive weakness as  mentioned above in the HPI.  She has had declining functional status  with mobility, decreasing recently.   PHYSICAL EXAMINATION:  VITAL SIGNS:  Her T-max was 100.3, blood pressure  was 114/68, heart rate 110, O2 saturation 91%.  HEENT:  Head is normocephalic, atraumatic.  Extraocular muscle intact.  NECK:  Supple, nontender.  No palpable thyromegaly or mass.   CARDIOVASCULAR:  Tachycardia, normal S1-S2.  LUNGS:  Clear bilaterally.  ABDOMEN:  Soft, nontender, no rebound or guarding.  LOWER EXTREMITIES:  She does have a right externally rotated lower  extremity and shortened in comparison to the left.  There is mild edema  bilaterally and some heel tenderness due to pressure.  She has weakly  palpable peripheral pulses.  She has exquisite pain with passive  movement of both lower extremities.   LABORATORY DATA:  Sodium 134, potassium 3.6, BUN 5, creatinine 0.45,  glucose 117.  WBC of 8.6, hemoglobin 11.1, platelet count 204.  Chest x-  ray revealed a right upper lobe 9.1 mm questionable nodule with  recommendations for CT per Dr. Bonnielee Haff, and x-ray revealed right femoral  neck fracture.  EKG revealed sinus tachycardia and low voltage QRS.   ASSESSMENT:  1. Right femoral neck fracture.  Generally wheelchair bound, but had      recently been able to ambulate.  2. Peripheral vascular disease.  3. Fever and recent resistant Escherichia coli urinary tract      infection.  4. O2 dependent chronic obstructive pulmonary disease.  5. History of cerebrovascular accident in the past.  6. Gastroesophageal reflux disease.  7. Dementia.  8. Subcentimeter lung finding.  Will need CT follow-up.  9. Do not resuscitate.   PLAN AT THIS TIME:  Would await timing and extent of surgical procedure  to be determined by Dr. Rennis Chris.  At this juncture, we do not anticipate  any further risk stratifying studies with reference to cardiovascular.  She does not have significant EKG findings with regard to ischemia.  We  will implement a very low dose beta-blocker if she tolerates and  recommend DVT prophylactic measures.  At this time, she is currently on  Pletal for peripheral vascular disease, which I would recommend holding  at this time until a surgical plan and time can be determined.  We will  continue her home medications and pain control and order physical   therapy and occupational therapy at an appropriate time.      Hettie Holstein, D.O.  Electronically Signed     ESS/MEDQ  D:  02/20/2007  T:  02/21/2007  Job:  [id number]   cc:   Maxwell Caul, M.D.

## 2010-07-28 NOTE — Discharge Summary (Signed)
NAMEMarland Kitchen  Dorothy Collins, Dorothy Collins NO.:  [id number]   MEDICAL RECORD NO.:  [id number]          PATIENT TYPE:  INP   LOCATION:  1616                         [facility]ACILITY:  [facility]   PHYSICIAN:  Madlyn Frankel. Charlann Boxer, M.D.  DATE OF BIRTH:  23-Mar-1930   DATE OF ADMISSION:  07/15/2008  DATE OF DISCHARGE:  07/18/2008                               DISCHARGE SUMMARY   ADMITTING DIAGNOSES:  1. Osteoarthritis.  2. Chronic obstructive pulmonary disease.  3. Emphysema.  4. Gastroesophageal reflux disease.  5. Cerebrovascular accident.  6. Depression.  7. Dementia.  8. History of breast cancer.  9. Chronic lower back pain.  10.Peripheral vascular disease.  11.Anxiety.  12.Hyperlipidemia.   DISCHARGE DIAGNOSES:  1. Osteoarthritis.  2. Chronic obstructive pulmonary disease.  3. Emphysema.  4. Gastroesophageal reflux disease.  5. Cerebrovascular accident.  6. Depression.  7. Dementia.  8. History of breast cancer.  9. Chronic lower back pain.  10.Peripheral vascular disease.  11.Anxiety.  12.Hyperlipidemia.   HISTORY OF PRESENT ILLNESS:  A 77 year old female with history of right  knee pain secondary to osteoarthritis refractory to all conservative  treatment.   CONSULTATIONS:  None.   LABORATORY DATA:  CBC final reading - white blood cells 7.9, hemoglobin  10.3, hematocrit 30.5, platelets 170.  Metabolic panel - sodium 136,  potassium 3.6, BUN 4, creatinine 0.5, glucose 118.  Her UA showed many  bacteria prior to surgery and was treated preoperatively.   REASON FOR ADMISSION:  No chest x-ray found in chart.   CARDIOLOGY:  EKG showed a normal sinus rhythm.   HOSPITAL COURSE:  The patient was admitted to the hospital and underwent  a right total knee replacement.  Admitted to the orthopedic floor.  Her  stay was unremarkable.  She remained hemodynamically orthopedically  stable.  She made minimum progress with physical therapy.  Dressing was  changed.  No significant drainage  from the wound.  She remained  neurovascularly in the right lower extremity throughout her course of  stay.  She had improving quad function with positive straight leg raise.  She was weightbearing as tolerated.  When seen on day, she was stable  and ready for discharge.   DISCHARGE DISPOSITION:  Discharge to skilled nursing facility rehab in  stable and improved condition.   DISCHARGE DIET:  Heart-healthy.   DISCHARGE WOUND CARE:  Keep dry.   DISCHARGE MEDICATIONS:  1. Lovenox 40 mg subcutaneous q.24h. x10 days, then restart her home      medication.  2. Pletal 50 mg one p.o. b.i.d. after Lovenox is complete.  3. Robaxin 500 mg one p.o. q.6h. x2 weeks and then p.r.n.  4. Iron 325 mg one p.o. t.i.d. x1 week.  5. Keflex 500 mg one p.o. four times daily x7 days.  6. Norco 7.5/325 one to two p.o. q.4-6h. p.r.n. pain.  7. Spiriva 18 mcg inhaler one q.a.m.  8. Nexium 40 mg one q.a.m.  9. Lidoderm 5% patch, on 12/off 12 hours lower back.  10.Cymbalta 90 mg p.o. daily.  11.Vitamin B6 at 200 mg daily.  12.Flonase nasal  spray two sprays in nose daily.  13.Lasix 20 mg one p.o. q.a.m.  14.K-Dur 20 mEq p.o. q.a.m.  15.Fosamax, hold.  16.Altace 5 mg p.o. q.a.m.  17.Omeprazole 20 mg p.o. q.a.m.  18.Calcium 500 mg p.o. q.a.m., q.p.m.  19.Namenda 10 mg p.o. b.i.d.  20.Restasis 0.5% ophthalmic drops each eye q.a.m. and q.p.m.  21.Patanol ophthalmic 0.1% one drop each eye q.a.m. and q.p.m.  22.Ocuflox 0.3% one drop left eye every morning and evening.  23.Magic mouthwash b.i.d. p.r.n.  24.Chlordiazepoxide 10 mg 2 capsules three times daily.  25.Zyprexa 7.5 mg p.o. daily.  26.Travatan 0.004% eye drop each eye q.p.m.  27.Colace 100 mg p.o. b.i.d.  28.MiraLax 17 grams p.o. daily.  29.Benadryl 25 mg one p.o. q.6h. p.r.n.  30.Tylenol 325 mg two p.o. p.r.n.  31.Claritin 10 mg p.o. p.r.n.  32.Nystatin p.r.n.  33.Ambien 10 mg p.o. q.h.s. p.r.n.  34.Combivent inhaler p.r.n.  35.Albuterol  nebulizers p.r.n.   DISCHARGE FOLLOWUP:  Follow up with Dr. Charlann Boxer at phone number [phone] in  2 weeks for a wound check.   The primary care physician is Dr. Marijo Conception.  Direct questions to Dr.  Marijo Conception.     ______________________________  Dorothy Collins Dorothy Collins, [place]      Madlyn Frankel. Charlann Boxer, M.D.  Electronically Signed    BLM/MEDQ  D:  07/18/2008  T:  07/18/2008  Job:  [id number]   cc:   Bennett Scrape, MD  Fax: [phone]

## 2010-07-28 NOTE — Assessment & Plan Note (Signed)
OFFICE VISIT   SANTASIA, REW  DOB:  21-Sep-1930                                       12/24/2009  ZOXWR#:[id number]   I saw patient in the office today for follow-up after her recent left  carotid endarterectomy.  This is s 79-year woman who had been admitted  after an episode of expressive aphasia and right-sided weakness.  She  was found to have a left middle cerebral artery infarct.  She had a very  critical stenosis with minimal flow beyond the stenosis in her left  carotid artery.  CT angiogram confirmed patency of the artery above the  plaque.  She had repeated a episode of expressive aphasia with right-  sided weakness prior to her surgery and was taken to the operating room  on 12/05/2009 for left carotid endarterectomy.  She underwent an  uneventful left carotid endarterectomy with Dacron patch angioplasties  and did well postoperatively.  She was discharged on 12/06/2009.   Since she was discharged, she has been doing well and has no specific  complaints.  She has had no focal weakness or paresthesias.   On examination, this is a pleasant 77 year old woman who appears her  stated age.  Blood pressure is 88/59 in the right arm.  She declined  pressure on the left arm because of her mastectomy.  Heart rate is 85.  Respiratory rate is 20.  Lungs are clear bilaterally to auscultation.  Cardiac:  She has a regular rate and rhythm.  Neurologic:  She has good  strength in her upper extremities and lower extremities bilaterally.   She has known 60% to 79% right carotid stenosis, and I have recommend a  follow-up duplex scan in 6 months.  I will see her back at that time.  She knows to call sooner if she has problems.  She remains on Aggrenox.     Di Kindle. Edilia Bo, M.D.  Electronically Signed   CSD/MEDQ  D:  12/24/2009  T:  12/25/2009  Job:  [id number]

## 2010-07-31 NOTE — Op Note (Signed)
NAME:  Dorothy Collins, Dorothy Collins NO.:  [id number]   MEDICAL RECORD NO.:  [id number]          PATIENT TYPE:  AMB   LOCATION:  ENDO                         FACILITY:  [facility]   PHYSICIAN:  Petra Kuba, M.D.    DATE OF BIRTH:  April 27, 1930   DATE OF PROCEDURE:  DATE OF DISCHARGE:                                 OPERATIVE REPORT   PROCEDURE:  Colonoscopy.   ENDOSCOPIST:  Petra Kuba, M.D.   INDICATIONS FOR PROCEDURE:  Diarrhea, incontinence, history of polyps,  family history of colon cancer.   CONSENT:  Consent was signed after risks, benefits, methods, and options  were thoroughly discussed with both Ms. Taliercio and the daughter in the  office.   MEDICATIONS USED:  Demerol 100 mg, Versed 10 mg, Phenergan 12.5 mg.   DESCRIPTION OF PROCEDURE:  Rectal inspection was pertinent for external  hemorrhoids.  Digital exam was negative.  The pediatric video adjustable  colonoscope was inserted with moderate difficulty due to a long, looping,  tortuous colon requiring rolling her on her back and on her right side, and  then back to her left side.  With various abdominal pressures, we were able  to advance to the approximate level of the hepatic flexure.  Unfortunately,  the air and water became clogged and we were nonfunctional at that juncture.  The prep was poor.  A lot of washing and suction was done to get to this  junction and we needed to move her in some of these positions just to try to  open up the lumens which were compressed but on advancement, no  abnormalities were seen.  Withdrawal was difficult since we had no air to  inflate the colon.  We did try inflating it with the syringe.  We elected to  stop the procedure since there was too much stool to wash and suction at  this area.  Plus with her on her back, the entire scope was in her rectum,  although we were able to withdrawal some of the loop with rolling her on her  left side.  We were not able to clear enough stool  to be able to advance, so  we elected to withdrawal.  On withdrawal, visualization was poor due to the  lack of air but no other abnormalities were seen.  Also due to the poor  prep, lesions could have been missed but no significant ones were seen.  We  elected not to retroflex in the rectum since we could not inflate it.  The  scope was removed and the patient tolerated the procedure adequately.  There  was no obvious immediate complication.   ENDOSCOPIC DIAGNOSES:  1.  Internal and external hemorrhoids.  2.  No abnormality on insertion or on quick withdrawal due to inability to      insufflate.  3.  Probable exam to the hepatic flexure stopped secondary to the scope      being clogged, formed stool not allowing visualization.   PLAN:  Consider retrying and reprepping with no signs of bleeding.  Okay to  hold off for now.  I would be happy to see back if symptoms continue.  Possibly her diarrhea was just overflow incontinence and possibly things  will work better with the semi-adequate prep and we will go ahead and  recheck labs today since she seems to be pale.  Possibly she will need a  transfusion and further workup and plans for that.       MEM/MEDQ  D:  02/21/2004  T:  02/22/2004  Job:  [id number]   cc:   Maxwell Caul, M.D.  [address]  [place]  [place] [address]  Fax: [phone]

## 2010-07-31 NOTE — H&P (Signed)
NAME:  ODETH, BRY NO.:  [id number]   MEDICAL RECORD NO.:  [id number]          PATIENT TYPE:  INP   LOCATION:  NA                           FACILITY:  [facility]   PHYSICIAN:  Madlyn Frankel. Charlann Boxer, M.D.  DATE OF BIRTH:  Juliannah 12, 1932   DATE OF ADMISSION:  07/09/2008  DATE OF DISCHARGE:                              HISTORY & PHYSICAL   PROCEDURE:  Right total knee replacement.   CHIEF COMPLAINTS:  Right knee pain.   HISTORY OF PRESENT ILLNESS:  A 77 year old female with a history of  right knee pain secondary to osteoarthritis.  It has been refractory all  conservative treatment.   PRIMARY CARE PHYSICIAN:  1. Marijo Conception, M.D.   OTHER PHYSICIANS:  1. Dr. Lucretia Roers, optometrist.  2. Jetty Duhamel, M.D., pulmonologist.  3. Nanetta Batty, M.D., cardiovascular.   PAST MEDICAL HISTORY:  1. Osteoarthritis.  2. COPD.  3. Emphysema.  4. GERD.  5. CVA.  6. Depression.  7. Dementia.  8. History of breast cancer.  9. Chronic lower back pain.  10.Peripheral vascular disease.  11.Anxiety.  12.Hyperlipidemia.   PAST SURGICAL HISTORY:  1. Right hemiarthroplasty in 2008.  2. Tonsillectomy.  3. Adenoidectomy.  4. Bladder tack.  5. Tubal ligation.  6. Cholecystectomy.  7. Appendectomy.  8. Mastectomy, left side.   FAMILY HISTORY:  1. Coronary artery disease.  2. Colon cancer next.   SOCIAL HISTORY:  Widowed.  Resides in a nursing facility.  Her daughter,  Larita Fife, is her healthcare power of attorney.   DRUG ALLERGIES:  1. PENICILLIN.  2. SULFA DRUGS.   MEDICATIONS:  1. Spiriva inhaler one capsule daily.  2. Nexium 40 mg one p.o. daily.  3. Lidoderm 5% patches to the back; on 12 hours, off 12 hours.  4. Cymbalta 30 mg total, of 90 mg per day.  5. Vitamin B6 two tablets daily.  6. Fluticasone nasal spray two sprays each nostril daily.  7. Lasix 20 mg one p.o. q.a.m.  8. K-Dur 20 mEq p.o. daily.  9. Baby aspirin 81 mg daily.  10.Fosamax q. weekly.  11.Ramipril 5 mg  p.o. daily.  12.Prilosec p.r.n.  13.Cilostazol 50 mg one p.o. b.i.d.  14.Os-Cal 500 p.o. b.i.d.  15.Namenda 10 mg one p.o. b.i.d.  16.Robaxin 500 mg one p.o. q.a.m. and q.p.m.  17.Restasis eye drops, 0.05% solution, one drop in each eye twice      daily.  18.Patanol solution one drop each eye twice daily, wait 3-5 minutes      between two eye medications.  19.Ofloxacin insert one drop in left eye twice daily; wait 3-5 minutes      between two eye medications.  20.Darvocet-N 100 one p.o. p.r.n.  21.__________diazepam 10 mg two capsules three times a day.  22.Ultracet p.r.n.  23.Zyprexa 7.5 mg p.o. q.h.s.  24.Travatan Z 0.004% solution administered one drop in each eye every      night at bedtime.  25.Colace 100 mg p.o. b.i.d.  26.MiraLax 17 grams p.o. daily  27.Biofreeze p.r.n.  28.Benadryl p.r.n.  29.Tylenol p.r.n.  30.Claritin one p.o. daily.  31.Mycostatin  swish and swallow suspension as needed in mouth for      irritation.  32.Ambien 10 mg one p.o. q.h.s.  33.Mobic 7.5 mg p.o. daily.  34.Lotrisone applied at groin area b.i.d.  35.Combivent two puffs daily p.r.n. shortness of breath, wheezing.  36.Albuterol p.r.n.Marland Kitchen   REVIEW OF SYSTEMS:  GENERAL:  Loss of memory.  HEENT/NEURO:  Headaches,  insomnia, balance problems, dentures, hearing loss.  RESPIRATORY:  Shortness of breath on exertion, cough.   ALLERGIES:  COPD, chronic changes.  Last chest x-ray was May 2009.  CARDIOVASCULAR:  She has swelling.  Last EKG was in November of 2009.  GI:  She has IBS with diarrhea, constipation and heartburn.  GENITOURINARY:  She has incontinence.  MUSCULOSKELETAL:  Joint pain,  muscle pain, back pain, morning stiffness, muscular weakness.  Otherwise  see HPI.   PHYSICAL EXAMINATION:  VITAL SIGNS:  Pulse 84, respirations 16, blood  pressure 120/78.  GENERAL:  Awake, alert and oriented, well-developed, well-nourished.  HEENT:  Normocephalic.  NECK:  Supple.  No carotid bruits.  CHEST:   Lung sounds diminished but clear to auscultation bilaterally.  BREASTS:  Deferred.  HEART:  S1-S2 distinct.  ABDOMEN:  Soft, bowel sounds present.  PELVIS:  Stable.  GENITOURINARY:  Deferred.  EXTREMITIES:  Right knee with diffuse global tenderness.  A little bit  of flexion contracture.  SKIN:  No cellulitis.  NEUROLOGIC:  Intact distal sensibilities.   LABORATORY DATA:  Labs, EKG, chest x-ray all pending presurgical  testing.   IMPRESSION:  Right knee osteoarthritis.   PLAN OF ACTION:  Right total knee replacement by Dr. Charlann Boxer at [facility]  on July 09, 2008.  Risks and complications were discussed with the  patient and daughter, Larita Fife.   The patient is planning to return to skilled nursing facility rehab  postoperatively.     ______________________________  Yetta Glassman [address], [place]      Madlyn Frankel. Charlann Boxer, M.D.  Electronically Signed    BLM/MEDQ  D:  06/24/2008  T:  06/24/2008  Job:  [id number]

## 2010-07-31 NOTE — Op Note (Signed)
NAME:  Dorothy Collins, Dorothy Collins                  ACCOUNT NO.:  [id number]   MEDICAL RECORD NO.:  [id number]          PATIENT TYPE:  AMB   LOCATION:  ENDO                         FACILITY:  [facility]   PHYSICIAN:  Clinton D. Maple Hudson, M.D. DATE OF BIRTH:  Jan 24, 1931   DATE OF PROCEDURE:  01/28/2004  DATE OF DISCHARGE:                                 OPERATIVE REPORT   PROCEDURE PERFORMED:  Bronchoscopy.   INDICATIONS FOR PROCEDURE:  The patient is a 77 year old woman brought for  bronchoscopy today because she was reporting intermittent red hemoptysis  over the past week.  Chest CT done October 24 was negative.  She had fallen  recently and bruised her face.   PAST MEDICAL HISTORY:  Significant for chronic obstructive pulmonary  disease.  Left radical mastectomy without radiation or chemotherapy.  Gastroesophageal reflux disease.  Cerebrovascular accident with mild  dementia.  Peripheral vascular disease.  There has been no TB exposure.   REVIEW OF SYSTEMS:  Negative for chest pain, fever or other bleeding but it  wasn't clear from her history whether blood was coming from nasopharynx or  the chest.   PREOPERATIVE EVALUATION:  Blood pressure 113/72, regular pulse at 70, clear  chest, normal heart sounds and streak of red blood in her posterior pharynx.   ALLERGIES:  No medication allergy.   MEDICATIONS:  Continuous oxygen at 2 L.  Aspirin 81 mg.  Nexium, MiraLax,  Nasonex nasal spray, Advir 250/50 1 puff twice daily, Pletal and Plavix  which has been held the last three or four days, Librium taken three times  daily and a Combivent inhaler used p.r.n.   DESCRIPTION OF PROCEDURE:  After fully informed consent, bronchoscopy was  performed on an outpatient basis in the endoscopy suite.  Premedication was  not given. The upper airway was anesthetized topically with 1% Xylocaine and  Cetacaine spray.  A cumulative dose of 6 mg of intravenous Versed was  required for very mild sedation and she was  immediately conversant on ending  the procedure.  Oxygen was given at 8 L per minute holding saturations over  90%.  Cardiac monitor showed sinus rhythm.  A video endoscope was introduced  via the right nostril and advanced to the level of the vocal cords without  difficulty.  The cords moved normally.  No bleeding was seen either on  introduction of the bronchoscope or withdrawal, the level of the  nasopharynx.  The trachea and main carina were normal.  The bronchial tree  was evaluated bilaterally to at least the fourth division level  with no  evidence of bleeding seen at all.  The anatomy was normal, mucosa normal.  There were copious clear mucoid secretions.  The inferior lingula and  superior segment of the right lower lobe were proximally brushed for  cytology with minor superficial mucosal friability and trace blood  introduced at that point but no free bleeding.  Secretions were submitted  for cytology evaluation and routine cultures.  The patient tolerated the  procedure well.  A post bronchoscopy sputum is being collected for  additional cytology  and screening.   FINAL IMPRESSION:  Hemorrhagic bronchitis.  Her recent anticoagulation  therapy likely contributed.  Her secretions were so copious as to raise  possibility of mild increased pulmonary vascular pressures, possibly  subclinical heart failure which was otherwise not evident.  She will be held  until stable and then released back to her nursing home with family.      Clin   CDY/MEDQ  D:  01/28/2004  T:  01/28/2004  Job:  [id number]   cc:   Maxwell Caul, M.D.  [address]  [place]  [place] [address]  Fax: [phone]

## 2010-07-31 NOTE — H&P (Signed)
NAME:  Dorothy Collins NO.:  [id number]   MEDICAL RECORD NO.:  [id number]          PATIENT TYPE:  EMS   LOCATION:  MAJO                         FACILITY:  [facility]   PHYSICIAN:  Lonia Blood, M.D.DATE OF BIRTH:  [birth year]   DATE OF ADMISSION:  06/04/2005  DATE OF DISCHARGE:                                HISTORY & PHYSICAL   PRIMARY CARE PHYSICIAN:  [facility], Maxwell Caul, M.D.   CHIEF COMPLAINT:  Shortness of breath.   HISTORY OF PRESENT ILLNESS:  Ms. Dorothy Collins is a 77 year old resident at  [facility].  She was sent to the emergency room tonight, apparently  because of shortness of breath.  The patient herself is lethargic and  altered in her mental status; therefore, unable to provide any history.  A  gentleman who identifies himself as her brother-in-law is here to visit her  but does not know anything of the events surrounding her transfer to the  emergency room.  The emergency room staff reports that the patient was  transferred because of shortness of breath and fever.  In the emergency  room, a temperature of 104.1 was registered at the time of her presentation.  The patient was in significant respiratory distress upon her arrival, and O2  sats were in the high 80s and low 90s, even with 2 liters per minute nasal  cannula.  The patient was placed on a nonrebreather face mask and has  received antibiotics and nebulizer treatments.   REVIEW OF SYSTEMS:  Comprehensive review of systems is not able to be  accomplished, as the patient is altered in her mental status.   PAST MEDICAL HISTORY:  Per old records (No records available from nursing  home).  1.  COPD.  2.  Status post left radical mastectomy without XRT or chemotherapy.  3.  Gastroesophageal reflux disease.  4.  History of CVA.  5.  Dementia.  6.  Peripheral vascular disease.  7.  Internal and external hemorrhoids via colonoscopy in December, 2005.  8.  No code  blue/DNR.  9.  Depression.   OUTPATIENT MEDICATIONS:  1.  Zyrtec 10 mg p.o. q.h.s.  2.  Mucinex 600 mg daily.  3.  Cymbalta 30 mg q.h.s.  4.  Pletal 50 mg b.i.d.  5.  Zyprexa 7.5 mg q.h.s.  6.  Tylenol Arthritis 2 tablets b.i.d.  7.  Oxygen at 2 liters per minute nasal cannula.  8.  Advair 250/50 b.i.d.  9.  Albuterol neb q.i.d.  10. MS Contin 15 mg b.i.d.  11. Rocephin 1 gm IM daily, started June 04, 2005 in the nursing home.  12. Trazodone 50 mg q.h.s.  13. Nexium 40 mg daily.  14. Spiriva 18 mcg daily.  15. K-Dur 20 mg daily.  16. Lipitor 10 mg daily.  17. Miacalcin 1 spray alternating nostrils daily.  18. Maxzide 37.5/25 q.o.d.  19. Namenda 10 mg b.i.d.  20. Librium 20 mg b.i.d. and 10 mg at 2 p.m.   ALLERGIES:  Old records indicate allergies to PENICILLIN and SULFA.   FAMILY  HISTORY:  Noncontributory secondary to age.   DIET:  Listed as regular with ground meats, per nursing home records.   SOCIAL HISTORY:  The patient lives at [facility] but other social  history is not available on her at present.   DATA REVIEW:  White count is normal at 7.7.  Platelets are normal at 254.  Hemoglobin is low at 10.9 with an MCV of 87.  Sodium is low at 131.  Potassium is 3.7.  Chloride and bicarb are normal.  BUN is 12.  Creatinine  is 0.8.  Serum glucose is elevated at 128.  The pH is 7.53, pCO2 27, pO2 is  unavailable on a venous gas.   Chest x-ray reveals patchy bibasilar infiltrates but chest x-ray on June 01, 2005 at the nursing home revealed a clear right lower lobe infiltrate.   Urinalysis reveals 15 ketones, 100 protein, trace leukocyte esterase, and 3-  6 white blood cells.   PHYSICAL EXAMINATION:  VITAL SIGNS:  Temperature 104.1, blood pressure  147/59, heart rate 129, respiratory rate 20, O2 sat is 91% on 4 liters per  minute nasal cannula.  GENERAL:  Frail-appearing, lethargic female using accessory muscles and  tachypneic.  HEENT:  Pupils are equal and  round, although minimally reactive to light and  accommodation.  Extraocular muscles are intact.  Oral cavity is significant  for dry mucous membranes.  NECK:  No JVD.  No lymphadenopathy.  LUNGS:  Very poor air movement throughout all fields.  No appreciable  wheeze.  Bibasilar crackles are appreciable.  CARDIOVASCULAR:  Distant.  Tachycardic at approximately 105 beats per minute  without gallop or rub.  ABDOMEN:  Mildly protuberant.  Soft.  Bowel sounds are present and are high-  pitched.  There is no rebound.  There is no ascites.  There is no  appreciable mass.  EXTREMITIES:  Trace bilateral lower extremity edema to the knees without  clubbing or cyanosis.  NEUROLOGIC:  The patient will respond to some simple commands.  At times  during my interview, she seems to ignore my questions, and at other times,  she answers them appropriately.  She will follow some simple commands, but  her mental status is certainly fluctuating.  She does not move her lower  extremities on command.  A family member reports that she does have use of  her legs, but it is very minimal due to peripheral vascular disease and  severe osteoarthritis of the knees.  It is reported that the patient does  not ambulate.  She will squeeze my fingers with both of her hands, and the  family reports that she normally has retained strength in her arms and  hands.  The family reports that at her baseline, she is able to carry a full  conversation and in fact, independently places phone calls on a regular  basis to family members for simple conversations.   IMPRESSION/PLAN:  1.  Bibasilar pneumonia:  The patient appears to have failed therapy in the      nursing home setting for pneumonia.  At the initial portion of my exam,      she was in significant respiratory distress.  By the end of my exam, she     seems to have calmed down.  She does have with her a no code blue/do not      resuscitate form.  We will honor this wish  of hers and not proceed with      intubation or  mechanical ventilation, should she decay further.  We will      dose the patient with empiric antibiotics using Rocephin and      azithromycin to cover atypicals.  Will withhold steroids at the present      time, as the patient does not appear to be wheezing significantly.  It      is quite possible, however, that a bronchospastic component will      develop, and we will need to watch this closely.  If this does occur, we      will add steroids to her regimen.  We will continue albuterol and      Atrovent on a frequent scheduling routine.  2.  Pyelonephritis:  Patient's altered mental status is likely a combination      of baseline dementia plus acute illness. to include bilateral pneumonia      as well as a pyelonephritis.  Rocephin should provide adequate empiric      coverage for the usual pathogens with pyelonephritis.  Urine will be      sent for culture to assure that we are treating her appropriately and      that she has not developed a multidrug-resistant infection.  3.  Altered mental status:  As discussed above, I feel that the patient's      altered mental status is likely multifactorial.  With the baseline      dementia, it would be easy to experience acute decline due to      pyelonephritis as well as a pneumonia.  We will continue the patient's      Namenda, and we will treat each individual infection and follow the      patient closely.  4.  Normocytic anemia:  I will not pursue aggressive workup at this time.      We will simply follow the patient's hemoglobin trend.  If she drops      below 10, we will consider further evaluation.  5.  Hyponatremia:  Patient is exhibiting a mild hyponatremia.  She is on      Maxzide in the outpatient setting, which is likely contributing to this      in the face of a mild dehydration due to her acute illness.  She will be      rehydrated carefully with D5 normal saline at 100 cc/hr.  We will  hold      her diuretic, and we will follow up a BMET in the morning.  6.  Dementia:  Per the patient's family, the patient is highly interactive      at her baseline.  She clearly is altered at the present time.  As      discussed above, we will continue her Namenda and treat her acute      illnesses in hopes that she will return to her baseline during this      hospital stay.  7.  No code blue/do not resuscitate:  The patient presents to the emergency      room with a no code blue form from the nursing home setting.  We will      honor these wishes during her hospital stay and observe no code blue      status.      Lonia Blood, M.D.  Electronically Signed     JTM/MEDQ  D:  06/04/2005  T:  06/04/2005  Job:  [id number]   cc:   Maxwell Caul, M.D.  Fax: [phone]

## 2010-07-31 NOTE — Discharge Summary (Signed)
NAME:  Dorothy Collins, Dorothy Collins NO.:  [id number]   MEDICAL RECORD NO.:  [id number]          PATIENT TYPE:  INP   LOCATION:  5736                         FACILITY:  [facility]   PHYSICIAN:  Hillery Aldo, M.D.   DATE OF BIRTH:  06/17/1930   DATE OF ADMISSION:  06/04/2005  DATE OF DISCHARGE:  06/09/2005                                 DISCHARGE SUMMARY   PRIMARY CARE PHYSICIAN:  Dr. Baltazar Najjar, [facility]   DISCHARGE DIAGNOSES:  1.  Bilateral pneumonia.  2.  Pyuria with bacteruria.  Cultures revealed polymicrobial growth.  3.  Hyponatremia.  4.  Hypokalemia.  5.  Dementia with mental status changes.  6.  Anemia of chronic disease.  7.  Thrombocytosis.  8.  Chronic obstructive pulmonary disease.  9.  Gastroesophageal reflux disease.  10. History of cerebrovascular accident.  11. Peripheral vascular disease.  12. Depression.   DISCHARGE MEDICATIONS:  1.  Zyrtec 10 mg p.o. q.h.s.  2.  Mucinex 600 mg daily.  3.  Cymbalta 30 mg q.h.s.  4.  Pletal 50 mg b.i.d.  5.  Zyprexa 7.5 mg q.h.s.  6.  Tylenol Arthritis two tablets b.i.d.  7.  Oxygen at 2 L/minute nasal cannula.  8.  Advair 250/50 one inhalation b.i.d.  9.  Albuterol nebulizer treatments q.i.d.  10. MS Contin 50 mg b.i.d.  11. Trazodone 50 mg q.h.s.  12. Nexium 40 mg daily.  13. Spiriva 18 mcg one inhalation daily.  14. Potassium chloride 20 mEq daily.  15. Lipitor 10 mg daily.  16. Miacalcin one spray alternating nostrils daily.  17. Maxzide 37.5/25 every other day.  18. Namenda 10 mg b.i.d.  19. Avelox 400 mg through June 15, 2005.   CONSULTS:  None.   PROCEDURES AND DIAGNOSTIC STUDIES:  1.  Chest x-ray on June 04, 2005 showed patchy bilateral infiltrates.  2.  Chest x-ray on June 06, 2005 showed some radiographic improvement.   DISCHARGE LABORATORIES:  C. difficile toxin studies were negative.  Fecal  occult blood testing was negative.  Ferritin was 199.  CBC showed a white  blood cell  count of 7.8, hemoglobin 9.2, hematocrit 27.4, and platelets of  415.  Sodium was 139, potassium 4, chloride 108, bicarbonate 24, BUN 3,  creatinine 0.5, glucose 136.  Vitamin B12 was 443.  Folate was 10.6.  Iron  was 25, total iron binding capacity 273, __________% saturation.  Urine  cultures revealed 50,000 colonies of multiple bacteria morphea-type with  none predominant.   BRIEF ADMISSION HISTORY OF PRESENT ILLNESS:  Patient is a 77 year old female  who was sent to the emergency department from her skilled nursing facility  on the date of admission secondary to dyspnea.  She was also lethargic and  had altered mental status.  A work-up in the emergency department did show  bilateral infiltrates consistent with pneumonia and the patient did have a  temperature of 104.1 so she was admitted for treatment of her pneumonia.   HOSPITAL COURSE:  #1 - BILATERAL PNEUMONIA:  Patient was admitted and  empirically put on antibiotic  therapy which included Rocephin and  azithromycin.  She defervesced on this regimen and her mental status began  to improve.  She was also continued on supplemental oxygen and nebulizer  therapy as needed.  No further fevers were apparent.  Her antibiotics were  changed to p.o. Avelox prior to discharge which she tolerated well.  She  should complete a full 10-day course of therapy.   #2 - PYURIA/BACTERURIA:  The patient's cultures did not reveal any prominent  bacteria morphea-type.  Nonetheless, the antibiotics that were given for her  pneumonia should have covered her if she did have a urinary tract infection.   #3 - HYPONATREMIA:  Patient's hyponatremia resolved with fluid rehydration.   #4 - HYPOKALEMIA:  The patient's hypokalemia was repleted and stable prior  to discharge.   #5 - DEMENTIA WITH MENTAL STATUS CHANGES:  The patient's mental status  changes were thought to be secondary to underlying infection in the setting  of chronic dementia.  Her mental  status has returned to baseline and she is  stable for discharge back to her skilled nursing facility.   #6 - ANEMIA:  Patient had extensive iron studies done.  She was not found to  be deficient in iron, folate, or B12.  Her fecal occult blood testing was  negative.  Her anemia was thought to be secondary to chronic disease.   #7 - THROMBOCYTOSIS:  Patient did have a reactive thrombocytosis.  No  intervention was needed and this will likely resolve as her pneumonia  clears.   DISPOSITION:  The patient will be discharged back to her skilled nursing  facility.  She should follow up with her primary care physician late this  week or early next week.   CONDITION ON DISCHARGE:  Improved.           ______________________________  Hillery Aldo, M.D.     CR/MEDQ  D:  06/09/2005  T:  06/09/2005  Job:  [id number]   cc:   Maxwell Caul, M.D.  Fax: [phone]

## 2010-08-05 ENCOUNTER — Emergency Department (HOSPITAL_COMMUNITY)
Admission: EM | Admit: 2010-08-05 | Discharge: 2010-08-05 | Disposition: A | Discharge status: Home / Self Care | Payer: Medicare Other | Attending: Emergency Medicine | Admitting: Emergency Medicine

## 2010-08-05 DIAGNOSIS — G8929 Other chronic pain: Secondary | ICD-10-CM | POA: Insufficient documentation

## 2010-08-05 DIAGNOSIS — J438 Other emphysema: Secondary | ICD-10-CM | POA: Insufficient documentation

## 2010-08-05 DIAGNOSIS — I739 Peripheral vascular disease, unspecified: Secondary | ICD-10-CM | POA: Insufficient documentation

## 2010-08-05 DIAGNOSIS — E785 Hyperlipidemia, unspecified: Secondary | ICD-10-CM | POA: Insufficient documentation

## 2010-08-05 DIAGNOSIS — R209 Unspecified disturbances of skin sensation: Secondary | ICD-10-CM | POA: Insufficient documentation

## 2010-08-05 DIAGNOSIS — M1990 Unspecified osteoarthritis, unspecified site: Secondary | ICD-10-CM | POA: Insufficient documentation

## 2010-08-05 DIAGNOSIS — Z8673 Personal history of transient ischemic attack (TIA), and cerebral infarction without residual deficits: Secondary | ICD-10-CM | POA: Insufficient documentation

## 2010-08-05 DIAGNOSIS — F068 Other specified mental disorders due to known physiological condition: Secondary | ICD-10-CM | POA: Insufficient documentation

## 2010-08-05 DIAGNOSIS — F341 Dysthymic disorder: Secondary | ICD-10-CM | POA: Insufficient documentation

## 2010-08-05 DIAGNOSIS — M549 Dorsalgia, unspecified: Secondary | ICD-10-CM | POA: Insufficient documentation

## 2010-08-05 DIAGNOSIS — Z79899 Other long term (current) drug therapy: Secondary | ICD-10-CM | POA: Insufficient documentation

## 2010-08-05 DIAGNOSIS — E119 Type 2 diabetes mellitus without complications: Secondary | ICD-10-CM | POA: Insufficient documentation

## 2010-08-05 DIAGNOSIS — Z794 Long term (current) use of insulin: Secondary | ICD-10-CM | POA: Insufficient documentation

## 2010-08-05 DIAGNOSIS — K219 Gastro-esophageal reflux disease without esophagitis: Secondary | ICD-10-CM | POA: Insufficient documentation

## 2010-08-05 DIAGNOSIS — R51 Headache: Secondary | ICD-10-CM | POA: Insufficient documentation

## 2010-08-05 DIAGNOSIS — Z853 Personal history of malignant neoplasm of breast: Secondary | ICD-10-CM | POA: Insufficient documentation

## 2010-08-06 ENCOUNTER — Other Ambulatory Visit: Payer: Self-pay | Admitting: Internal Medicine

## 2010-08-06 DIAGNOSIS — I639 Cerebral infarction, unspecified: Secondary | ICD-10-CM

## 2010-08-07 ENCOUNTER — Encounter (HOSPITAL_COMMUNITY): Payer: Self-pay

## 2010-08-07 ENCOUNTER — Ambulatory Visit (HOSPITAL_COMMUNITY)
Admission: RE | Admit: 2010-08-07 | Discharge: 2010-08-07 | Disposition: A | Discharge status: Home / Self Care | Payer: Medicare Other | Source: Ambulatory Visit | Attending: Internal Medicine | Admitting: Internal Medicine

## 2010-08-07 ENCOUNTER — Other Ambulatory Visit: Payer: Self-pay | Admitting: Internal Medicine

## 2010-08-07 DIAGNOSIS — I10 Essential (primary) hypertension: Secondary | ICD-10-CM | POA: Insufficient documentation

## 2010-08-07 DIAGNOSIS — R42 Dizziness and giddiness: Secondary | ICD-10-CM | POA: Insufficient documentation

## 2010-08-07 DIAGNOSIS — I639 Cerebral infarction, unspecified: Secondary | ICD-10-CM

## 2010-08-07 DIAGNOSIS — G319 Degenerative disease of nervous system, unspecified: Secondary | ICD-10-CM | POA: Insufficient documentation

## 2010-08-07 DIAGNOSIS — I679 Cerebrovascular disease, unspecified: Secondary | ICD-10-CM | POA: Insufficient documentation

## 2010-08-07 HISTORY — DX: Unspecified dementia, unspecified severity, without behavioral disturbance, psychotic disturbance, mood disturbance, and anxiety: F03.90

## 2010-08-07 HISTORY — DX: Cerebral infarction, unspecified: I63.9

## 2010-08-07 HISTORY — DX: Malignant (primary) neoplasm, unspecified: C80.1

## 2010-08-07 HISTORY — DX: Chronic obstructive pulmonary disease, unspecified: J44.9

## 2010-09-02 ENCOUNTER — Ambulatory Visit (INDEPENDENT_AMBULATORY_CARE_PROVIDER_SITE_OTHER): Payer: Medicare Other | Admitting: Vascular Surgery

## 2010-09-02 ENCOUNTER — Other Ambulatory Visit (INDEPENDENT_AMBULATORY_CARE_PROVIDER_SITE_OTHER): Payer: Medicare Other

## 2010-09-02 DIAGNOSIS — I6529 Occlusion and stenosis of unspecified carotid artery: Secondary | ICD-10-CM

## 2010-09-02 NOTE — Assessment & Plan Note (Signed)
OFFICE VISIT  Dorothy Collins, Dorothy Collins DOB:  31-Jan-1931                                       09/02/2010 [id number]#:[id number]  I saw patient in the office today for follow-up of her carotid disease. This is a pleasant 77 year old woman who has had previous left carotid endarterectomy in September 2011.  We have been following her with mild right carotid disease.  Since I saw her last in October 2011, she has had no history of stroke, TIAs, expressive or receptive aphasia, or amaurosis fugax.  SOCIAL HISTORY:  She is not a smoker.  REVIEW OF SYSTEMS:  CARDIOVASCULAR:  She had no chest pain, chest pressure, palpitations or arrhythmias. PULMONARY:  She has had no productive cough, bronchitis, asthma, or wheezing.  PHYSICAL EXAMINATION:  This is a pleasant 77 year old woman who appears her stated age.  Blood pressure is 79/44, heart rate is 89, respiratory rate is 12.  Lungs are clear bilaterally to auscultation without rales, rhonchi, or wheezing.  Cardiovascular:  I do not detect any carotid bruits.  She has a regular rate and rhythm.  She has palpable femoral pulses and warm, well-perfused feet.  Abdomen is soft and nontender with normal-pitched bowel sounds.  Neurologic:  She has no focal weakness or paresthesias.  I have independently interpret her carotid duplex scan, which shows a 20% to 39% right carotid stenosis with no evidence of recurrent stenosis on the left.  I have recommended a follow-up carotid duplex scan in 6 months, and I will see her back at that time.  She knows to call sooner if she has problems.  In meantime, she knows to continue taking her aspirin.    Di Kindle. Edilia Bo, M.D. Electronically Signed  CSD/MEDQ  D:  09/02/2010  T:  09/02/2010  Job:  [id number]

## 2010-09-17 NOTE — Procedures (Unsigned)
CAROTID DUPLEX EXAM  INDICATION:  Left carotid endarterectomy.  HISTORY: Diabetes:  No. Cardiac:  No. Hypertension:  No. Smoking:  Previous. Previous Surgery:  Left carotid endarterectomy on 12/05/2009. CV History:  Complaint of dizziness after getting off the exam table. Amaurosis Fugax No, Paresthesias No, Hemiparesis No.                                      RIGHT             LEFT Brachial systolic pressure:         68                Mastectomy Brachial Doppler waveforms:         Normal Vertebral direction of flow:        Antegrade         Antegrade DUPLEX VELOCITIES (cm/sec) CCA peak systolic                   M = 60/D = 170    109 ECA peak systolic                   267               158 ICA peak systolic                   149               52 ICA end diastolic                   24                11 PLAQUE MORPHOLOGY:                  Mixed             Heterogenous PLAQUE AMOUNT:                      Moderate          Mild PLAQUE LOCATION:                    ECA/ICA/CCA       CCA  IMPRESSION: 1. Doppler velocities suggest a high-end 20% to 39% stenosis of the     right proximal internal carotid artery. 2. Patent left carotid endarterectomy site with no left internal     carotid artery stenosis. 3. Right external carotid artery and distal common carotid artery     stenoses noted.  ___________________________________________ Di Kindle. Edilia Bo, M.D.  CH/MEDQ  D:  09/03/2010  T:  09/03/2010  Job:  [id number]

## 2010-12-21 LAB — CBC
HCT: 26.7 — ABNORMAL LOW
HCT: 29.8 — ABNORMAL LOW
Hemoglobin: 10.4 — ABNORMAL LOW
Hemoglobin: 8.7 — ABNORMAL LOW
MCHC: 34.1
MCHC: 34.3
MCHC: 34.8
MCV: 84.5
MCV: 84.6
MCV: 85.6
Platelets: 218
Platelets: 234
Platelets: 277
RBC: 2.96 — ABNORMAL LOW
RBC: 3.16 — ABNORMAL LOW
RBC: 3.32 — ABNORMAL LOW
RBC: 3.52 — ABNORMAL LOW
RDW: 15.6 — ABNORMAL HIGH
WBC: 5.7
WBC: 6
WBC: 6.9
WBC: 8.6

## 2010-12-21 LAB — DIFFERENTIAL
Basophils Absolute: 0
Basophils Relative: 0
Lymphocytes Relative: 15
Monocytes Absolute: 0.4
Neutro Abs: 6.6
Neutrophils Relative %: 77

## 2010-12-21 LAB — BASIC METABOLIC PANEL
BUN: 5 — ABNORMAL LOW
BUN: 5 — ABNORMAL LOW
CO2: 26
Calcium: 7.8 — ABNORMAL LOW
Calcium: 8.2 — ABNORMAL LOW
Calcium: 8.4
Chloride: 103
Creatinine, Ser: 0.45
Creatinine, Ser: 0.6
GFR calc Af Amer: >60
GFR calc Af Amer: >60
GFR calc Af Amer: >60
GFR calc non Af Amer: >60
GFR calc non Af Amer: >60
Potassium: 3.4 — ABNORMAL LOW
Sodium: 133 — ABNORMAL LOW
Sodium: 135

## 2010-12-21 LAB — CROSSMATCH
ABO/RH(D): O NEG
Antibody Screen: NEGATIVE

## 2010-12-21 LAB — URINALYSIS, ROUTINE W REFLEX MICROSCOPIC
Bilirubin Urine: NEGATIVE
Nitrite: NEGATIVE
Specific Gravity, Urine: 1.015
Urobilinogen, UA: 0.2
pH: 7

## 2010-12-21 LAB — URINE CULTURE: Colony Count: NO GROWTH

## 2011-08-19 ENCOUNTER — Encounter: Payer: Self-pay | Admitting: Vascular Surgery

## 2011-08-30 ENCOUNTER — Emergency Department (HOSPITAL_COMMUNITY): Payer: Medicare Other

## 2011-08-30 ENCOUNTER — Emergency Department (HOSPITAL_COMMUNITY)
Admission: EM | Admit: 2011-08-30 | Discharge: 2011-08-31 | Disposition: A | Discharge status: Home / Self Care | Payer: Medicare Other | Attending: Emergency Medicine | Admitting: Emergency Medicine

## 2011-08-30 ENCOUNTER — Encounter (HOSPITAL_COMMUNITY): Payer: Self-pay

## 2011-08-30 DIAGNOSIS — E871 Hypo-osmolality and hyponatremia: Secondary | ICD-10-CM | POA: Insufficient documentation

## 2011-08-30 DIAGNOSIS — R51 Headache: Secondary | ICD-10-CM | POA: Insufficient documentation

## 2011-08-30 DIAGNOSIS — F0390 Unspecified dementia without behavioral disturbance: Secondary | ICD-10-CM | POA: Insufficient documentation

## 2011-08-30 DIAGNOSIS — Z8673 Personal history of transient ischemic attack (TIA), and cerebral infarction without residual deficits: Secondary | ICD-10-CM | POA: Insufficient documentation

## 2011-08-30 DIAGNOSIS — I6789 Other cerebrovascular disease: Secondary | ICD-10-CM | POA: Insufficient documentation

## 2011-08-30 HISTORY — DX: Insomnia, unspecified: G47.00

## 2011-08-30 LAB — URINALYSIS, ROUTINE W REFLEX MICROSCOPIC
Bilirubin Urine: NEGATIVE
Glucose, UA: NEGATIVE mg/dL
Hgb urine dipstick: NEGATIVE
Ketones, ur: NEGATIVE mg/dL
Leukocytes, UA: NEGATIVE
Nitrite: NEGATIVE
Protein, ur: NEGATIVE mg/dL
Specific Gravity, Urine: 1.008 (ref 1.005–1.030)
Urobilinogen, UA: 0.2 mg/dL (ref 0.0–1.0)
pH: 6 (ref 5.0–8.0)

## 2011-08-30 LAB — BASIC METABOLIC PANEL WITH GFR
BUN: 12 mg/dL (ref 6–23)
CO2: 27 meq/L (ref 19–32)
Calcium: 8.6 mg/dL (ref 8.4–10.5)
Chloride: 92 meq/L — ABNORMAL LOW (ref 96–112)
Creatinine, Ser: 0.92 mg/dL (ref 0.50–1.10)
GFR calc Af Amer: 67 mL/min — ABNORMAL LOW
GFR calc non Af Amer: 58 mL/min — ABNORMAL LOW
Glucose, Bld: 145 mg/dL — ABNORMAL HIGH (ref 70–99)
Potassium: 3.9 meq/L (ref 3.5–5.1)
Sodium: 130 meq/L — ABNORMAL LOW (ref 135–145)

## 2011-08-30 LAB — POCT I-STAT TROPONIN I: Troponin i, poc: 0 ng/mL (ref 0.00–0.08)

## 2011-08-30 LAB — DIFFERENTIAL
Basophils Absolute: 0 10*3/uL (ref 0.0–0.1)
Basophils Relative: 1 % (ref 0–1)
Lymphocytes Relative: 34 % (ref 12–46)
Neutro Abs: 3 10*3/uL (ref 1.7–7.7)
Neutrophils Relative %: 53 % (ref 43–77)

## 2011-08-30 LAB — CBC
HCT: 31.9 % — ABNORMAL LOW (ref 36.0–46.0)
Hemoglobin: 11 g/dL — ABNORMAL LOW (ref 12.0–15.0)
MCH: 31.1 pg (ref 26.0–34.0)
MCHC: 34.5 g/dL (ref 30.0–36.0)
MCV: 90.1 fL (ref 78.0–100.0)
Platelets: 190 K/uL (ref 150–400)
RBC: 3.54 MIL/uL — ABNORMAL LOW (ref 3.87–5.11)
RDW: 12.7 % (ref 11.5–15.5)
WBC: 5.7 K/uL (ref 4.0–10.5)

## 2011-08-30 MED ORDER — SODIUM CHLORIDE 0.9 % IV BOLUS (SEPSIS)
500.0000 mL | Freq: Once | INTRAVENOUS | Status: AC
  Administered 2011-08-30: 500 mL via INTRAVENOUS

## 2011-08-30 NOTE — ED Notes (Signed)
Assisted pt onto bedpan and helped pt. dress.

## 2011-08-30 NOTE — ED Provider Notes (Signed)
I saw and evaluated the patient, reviewed the resident's note and I agree with the findings and plan.  EKG reviewed and agree  Pt had headache earlier today at [facility]. Given percocet, symptom free now.   Keylani Perlstein B. Bernette Mayers, MD 08/30/11 [id number]

## 2011-08-30 NOTE — ED Notes (Signed)
Report given to [facility], Cassandra RN in charge

## 2011-08-30 NOTE — ED Provider Notes (Signed)
History     CSN: [id number]  Arrival date & time 08/30/11  1958   First MD Initiated Contact with Patient 08/30/11 2011      Chief Complaint  Patient presents with  . Headache     Patient is a 78 y.o. female presenting with headaches. The history is provided by the patient.  Headache  This is a new problem. The current episode started 12 to 24 hours ago. The problem occurs constantly. The problem has been resolved. The headache is associated with bright light. The pain is located in the frontal region. The quality of the pain is described as dull. The pain is mild. The pain does not radiate. Pertinent negatives include no fever, no palpitations, no shortness of breath and no vomiting. She has tried oral narcotic analgesics for the symptoms. The treatment provided significant relief.    Past Medical History  Diagnosis Date  . Dementia   . CVA (cerebral vascular accident)   . Arthritis   . COPD (chronic obstructive pulmonary disease)     Emphysema  . GERD (gastroesophageal reflux disease)   . Cancer     Breast cancer  . Peripheral vascular disease   . Glaucoma   . Hyperlipidemia   . Insomnia     Past Surgical History  Procedure Date  . Carotid endarterectomy 2011    Left CEA  . Breast surgery     No family history on file.  History  Substance Use Topics  . Smoking status: Not on file  . Smokeless tobacco: Not on file  . Alcohol Use:     OB History    Grav Para Term Preterm Abortions TAB SAB Ect Mult Living                  Review of Systems  Constitutional: Negative for fever, chills, activity change and appetite change.  HENT: Negative for neck pain and neck stiffness.   Respiratory: Negative for cough, chest tightness, shortness of breath and wheezing.   Cardiovascular: Negative for chest pain and palpitations.  Gastrointestinal: Negative for vomiting, abdominal pain, diarrhea and constipation.  Genitourinary: Negative for dysuria, decreased urine volume  and difficulty urinating.  Skin: Negative for rash and wound.  Neurological: Positive for headaches. Negative for dizziness, tremors, seizures, syncope, facial asymmetry, speech difficulty, weakness, light-headedness and numbness.  Psychiatric/Behavioral: Negative for confusion and agitation.  All other systems reviewed and are negative.    Allergies  Lyrica; Morphine and related; Penicillins; and Sulfa antibiotics  Home Medications   Current Outpatient Rx  Name Route Sig Dispense Refill  . AZELASTINE HCL 137 MCG/SPRAY NA SOLN Nasal Place 1 spray into the nose 2 (two) times daily. Use in each nostril as directed    . BRIMONIDINE TARTRATE 0.1 % OP SOLN  1 drop.    Marland Kitchen CALCIUM CARBONATE-VITAMIN D 500-200 MG-UNIT PO TABS Oral Take 1 tablet by mouth daily.    . ASPIRIN-DIPYRIDAMOLE ER 25-200 MG PO CP12 Oral Take 1 capsule by mouth 2 (two) times daily.    . DULOXETINE HCL 60 MG PO CPEP Oral Take 60 mg by mouth daily.    Marland Kitchen LOSARTAN POTASSIUM 100 MG PO TABS Oral Take 100 mg by mouth daily.    Marland Kitchen MEMANTINE HCL 10 MG PO TABS Oral Take 10 mg by mouth 2 (two) times daily.    Marland Kitchen METOCLOPRAMIDE HCL 5 MG PO TABS Oral Take 5 mg by mouth 4 (four) times daily.    . OLOPATADINE HCL 0.1 %  OP SOLN  1 drop 2 (two) times daily.    . OMEGA-3-ACID ETHYL ESTERS 1 G PO CAPS Oral Take 2 g by mouth 2 (two) times daily.    . QUETIAPINE FUMARATE 50 MG PO TABS Oral Take 50 mg by mouth at bedtime.    . SENNA-DOCUSATE SODIUM 8.6-50 MG PO TABS Oral Take 1 tablet by mouth daily.    Marland Kitchen SIMVASTATIN 20 MG PO TABS Oral Take 20 mg by mouth every evening.    . TRAVOPROST (BAK FREE) 0.004 % OP SOLN  1 drop at bedtime.      BP 125/64  Pulse 71  Temp 97.8 F (36.6 C) (Oral)  Resp 16  SpO2 94%  Physical Exam  Nursing note and vitals reviewed. Constitutional: She appears well-developed and well-nourished.  HENT:  Head: Normocephalic and atraumatic.  Right Ear: External ear normal.  Left Ear: External ear normal.  Nose:  Nose normal.  Mouth/Throat: Oropharynx is clear and moist. No oropharyngeal exudate.  Eyes: Conjunctivae are normal. Pupils are equal, round, and reactive to light.  Neck: Normal range of motion. Neck supple.  Cardiovascular: Normal rate, regular rhythm, normal heart sounds and intact distal pulses.  Exam reveals no gallop and no friction rub.   No murmur heard. Pulmonary/Chest: Effort normal and breath sounds normal. No respiratory distress. She has no wheezes. She has no rales. She exhibits no tenderness.  Abdominal: Soft. Bowel sounds are normal. She exhibits no distension and no mass. There is no tenderness. There is no rebound and no guarding.  Musculoskeletal: Normal range of motion. She exhibits no edema and no tenderness.  Neurological: She is alert.  Skin: Skin is warm and dry.  Psychiatric: She has a normal mood and affect. Her behavior is normal. Judgment and thought content normal.    ED Course  Procedures (including critical care time)  Labs Reviewed  CBC - Abnormal; Notable for the following:    RBC 3.54 (*)     Hemoglobin 11.0 (*)     HCT 31.9 (*)     All other components within normal limits  DIFFERENTIAL - Abnormal; Notable for the following:    Eosinophils Relative 6 (*)     All other components within normal limits  BASIC METABOLIC PANEL - Abnormal; Notable for the following:    Sodium 130 (*)     Chloride 92 (*)     Glucose, Bld 145 (*)     GFR calc non Af Amer 58 (*)     GFR calc Af Amer 67 (*)     All other components within normal limits  URINALYSIS, ROUTINE W REFLEX MICROSCOPIC  POCT I-STAT TROPONIN I   Ct Head Wo Contrast  08/30/2011  *RADIOLOGY REPORT*  Clinical Data: Headache.  CT HEAD WITHOUT CONTRAST  Technique:  Contiguous axial images were obtained from the base of the skull through the vertex without contrast.  Comparison: 08/07/2010  Findings: There is atrophy and chronic small vessel disease changes. No acute intracranial abnormality.   Specifically, no hemorrhage, hydrocephalus, mass lesion, acute infarction, or significant intracranial injury.  No acute calvarial abnormality. Visualized paranasal sinuses and mastoids clear.  Orbital soft tissues unremarkable.  IMPRESSION: No acute intracranial abnormality.  Atrophy, chronic microvascular disease.  Original Report Authenticated By: Cyndie Chime, M.D.   Dg Chest Portable 1 View  08/30/2011  *RADIOLOGY REPORT*  Clinical Data: Headache, shortness of breath, chest pain.  PORTABLE CHEST - 1 VIEW  Comparison: 12/05/2009  Findings: Heart and mediastinal  contours are within normal limits. No focal opacities or effusions.  No acute bony abnormality.  IMPRESSION: No active cardiopulmonary disease.  Original Report Authenticated By: Cyndie Chime, M.D.     1. Headache   2. Hyponatremia      Date: 08/30/2011  Rate: 66 bpm  Rhythm: normal sinus rhythm  QRS Axis: normal  Intervals: Prolonged PR interval  ST/T Wave abnormalities: nonspecific ST changes  Conduction Disutrbances: normal  Narrative Interpretation: No evidence of acute ischemia  Old EKG Reviewed: unchanged (12/05/09)    MDM  78 yo F presents for headache after recorded BP 78/58 at nursing home. Per nursing records, pt's one recorded hypotensive BP was within 2 hrs of pt receiving two tablets of Percocet for her headache. Pt's mental status at baseline. Clinical picture not concerning for infection. Head CT not concerning for intracranial bleed. Labs negative except for hyponatremia; normal saline bolused. Headache resolved prior to arrival here. Patient never hypotensive and asymptomatic during her 3 hr ED stay. Patient given return precautions, including worsening of signs or symptoms. Patient instructed to follow-up with primary care physician.          Clemetine Marker, MD 08/31/11 [phone]

## 2011-08-30 NOTE — ED Notes (Signed)
Pt from [facility] for headache, blurred vision and dizziness x 3 days. Pt has nausea, denies any vomiting, diarrhea, weakness or neuro deficits noted. Pt MAE x 4, uses walker for ambulating. Pt given 2 Percocet at 1730 by facility.

## 2011-08-30 NOTE — ED Notes (Signed)
Patient transported to CT 

## 2011-08-30 NOTE — Discharge Instructions (Signed)
Headaches, Frequently Asked Questions MIGRAINE HEADACHES Q: What is migraine? What causes it? How can I treat it? A: Generally, migraine headaches begin as a dull ache. Then they develop into a constant, throbbing, and pulsating pain. You may experience pain at the temples. You may experience pain at the front or back of one or both sides of the head. The pain is usually accompanied by a combination of:  Nausea.   Vomiting.   Sensitivity to light and noise.  Some people (about 15%) experience an aura (see below) before an attack. The cause of migraine is believed to be chemical reactions in the brain. Treatment for migraine may include over-the-counter or prescription medications. It may also include self-help techniques. These include relaxation training and biofeedback.  Q: What is an aura? A: About 15% of people with migraine get an "aura". This is a sign of neurological symptoms that occur before a migraine headache. You may see wavy or jagged lines, dots, or flashing lights. You might experience tunnel vision or blind spots in one or both eyes. The aura can include visual or auditory hallucinations (something imagined). It may include disruptions in smell (such as strange odors), taste or touch. Other symptoms include:  Numbness.   A "pins and needles" sensation.   Difficulty in recalling or speaking the correct word.  These neurological events may last as long as 60 minutes. These symptoms will fade as the headache begins. Q: What is a trigger? A: Certain physical or environmental factors can lead to or "trigger" a migraine. These include:  Foods.   Hormonal changes.   Weather.   Stress.  It is important to remember that triggers are different for everyone. To help prevent migraine attacks, you need to figure out which triggers affect you. Keep a headache diary. This is a good way to track triggers. The diary will help you talk to your healthcare professional about your  condition. Q: Does weather affect migraines? A: Bright sunshine, hot, humid conditions, and drastic changes in barometric pressure may lead to, or "trigger," a migraine attack in some people. But studies have shown that weather does not act as a trigger for everyone with migraines. Q: What is the link between migraine and hormones? A: Hormones start and regulate many of your body's functions. Hormones keep your body in balance within a constantly changing environment. The levels of hormones in your body are unbalanced at times. Examples are during menstruation, pregnancy, or menopause. That can lead to a migraine attack. In fact, about three quarters of all women with migraine report that their attacks are related to the menstrual cycle.  Q: Is there an increased risk of stroke for migraine sufferers? A: The likelihood of a migraine attack causing a stroke is very remote. That is not to say that migraine sufferers cannot have a stroke associated with their migraines. In persons under age 40, the most common associated factor for stroke is migraine headache. But over the course of a person's normal life span, the occurrence of migraine headache may actually be associated with a reduced risk of dying from cerebrovascular disease due to stroke.  Q: What are acute medications for migraine? A: Acute medications are used to treat the pain of the headache after it has started. Examples over-the-counter medications, NSAIDs, ergots, and triptans.  Q: What are the triptans? A: Triptans are the newest class of abortive medications. They are specifically targeted to treat migraine. Triptans are vasoconstrictors. They moderate some chemical reactions in the brain.   The triptans work on receptors in your brain. Triptans help to restore the balance of a neurotransmitter called serotonin. Fluctuations in levels of serotonin are thought to be a main cause of migraine.  Q: Are over-the-counter medications for migraine  effective? A: Over-the-counter, or "OTC," medications may be effective in relieving mild to moderate pain and associated symptoms of migraine. But you should see your caregiver before beginning any treatment regimen for migraine.  Q: What are preventive medications for migraine? A: Preventive medications for migraine are sometimes referred to as "prophylactic" treatments. They are used to reduce the frequency, severity, and length of migraine attacks. Examples of preventive medications include antiepileptic medications, antidepressants, beta-blockers, calcium channel blockers, and NSAIDs (nonsteroidal anti-inflammatory drugs). Q: Why are anticonvulsants used to treat migraine? A: During the past few years, there has been an increased interest in antiepileptic drugs for the prevention of migraine. They are sometimes referred to as "anticonvulsants". Both epilepsy and migraine may be caused by similar reactions in the brain.  Q: Why are antidepressants used to treat migraine? A: Antidepressants are typically used to treat people with depression. They may reduce migraine frequency by regulating chemical levels, such as serotonin, in the brain.  Q: What alternative therapies are used to treat migraine? A: The term "alternative therapies" is often used to describe treatments considered outside the scope of conventional Western medicine. Examples of alternative therapy include acupuncture, acupressure, and yoga. Another common alternative treatment is herbal therapy. Some herbs are believed to relieve headache pain. Always discuss alternative therapies with your caregiver before proceeding. Some herbal products contain arsenic and other toxins. TENSION HEADACHES Q: What is a tension-type headache? What causes it? How can I treat it? A: Tension-type headaches occur randomly. They are often the result of temporary stress, anxiety, fatigue, or anger. Symptoms include soreness in your temples, a tightening  band-like sensation around your head (a "vice-like" ache). Symptoms can also include a pulling feeling, pressure sensations, and contracting head and neck muscles. The headache begins in your forehead, temples, or the back of your head and neck. Treatment for tension-type headache may include over-the-counter or prescription medications. Treatment may also include self-help techniques such as relaxation training and biofeedback. CLUSTER HEADACHES Q: What is a cluster headache? What causes it? How can I treat it? A: Cluster headache gets its name because the attacks come in groups. The pain arrives with little, if any, warning. It is usually on one side of the head. A tearing or bloodshot eye and a runny nose on the same side of the headache may also accompany the pain. Cluster headaches are believed to be caused by chemical reactions in the brain. They have been described as the most severe and intense of any headache type. Treatment for cluster headache includes prescription medication and oxygen. SINUS HEADACHES Q: What is a sinus headache? What causes it? How can I treat it? A: When a cavity in the bones of the face and skull (a sinus) becomes inflamed, the inflammation will cause localized pain. This condition is usually the result of an allergic reaction, a tumor, or an infection. If your headache is caused by a sinus blockage, such as an infection, you will probably have a fever. An x-ray will confirm a sinus blockage. Your caregiver's treatment might include antibiotics for the infection, as well as antihistamines or decongestants.  REBOUND HEADACHES Q: What is a rebound headache? What causes it? How can I treat it? A: A pattern of taking acute headache medications too   often can lead to a condition known as "rebound headache." A pattern of taking too much headache medication includes taking it more than 2 days per week or in excessive amounts. That means more than the label or a caregiver advises.  With rebound headaches, your medications not only stop relieving pain, they actually begin to cause headaches. Doctors treat rebound headache by tapering the medication that is being overused. Sometimes your caregiver will gradually substitute a different type of treatment or medication. Stopping may be a challenge. Regularly overusing a medication increases the potential for serious side effects. Consult a caregiver if you regularly use headache medications more than 2 days per week or more than the label advises. ADDITIONAL QUESTIONS AND ANSWERS Q: What is biofeedback? A: Biofeedback is a self-help treatment. Biofeedback uses special equipment to monitor your body's involuntary physical responses. Biofeedback monitors:  Breathing.   Pulse.   Heart rate.   Temperature.   Muscle tension.   Brain activity.  Biofeedback helps you refine and perfect your relaxation exercises. You learn to control the physical responses that are related to stress. Once the technique has been mastered, you do not need the equipment any more. Q: Are headaches hereditary? A: Four out of five (80%) of people that suffer report a family history of migraine. Scientists are not sure if this is genetic or a family predisposition. Despite the uncertainty, a child has a 50% chance of having migraine if one parent suffers. The child has a 75% chance if both parents suffer.  Q: Can children get headaches? A: By the time they reach high school, most young people have experienced some type of headache. Many safe and effective approaches or medications can prevent a headache from occurring or stop it after it has begun.  Q: What type of doctor should I see to diagnose and treat my headache? A: Start with your primary caregiver. Discuss his or her experience and approach to headaches. Discuss methods of classification, diagnosis, and treatment. Your caregiver may decide to recommend you to a headache specialist, depending upon  your symptoms or other physical conditions. Having diabetes, allergies, etc., may require a more comprehensive and inclusive approach to your headache. The National Head[employer] Foundation will provide, upon request, a list of [facility] physician members in your state. Document Released: 05/22/2003 Document Revised: 02/18/2011 Document Reviewed: 10/30/2007 [facility] Patient Information 2012 [place], [place].Hyponatremia  Hyponatremia is when the amount of salt (sodium) in your blood is too low. When sodium levels are low, your cells will absorb extra water and swell. The swelling happens throughout the body, but it mostly affects the brain. Severe brain swelling (cerebral edema), seizures, or coma can happen.  CAUSES   Heart, kidney, or liver problems.   Thyroid problems.   Adrenal gland problems.   Severe vomiting and diarrhea.   Certain medicines or illegal drugs.   Dehydration.   Drinking too much water.   Low-sodium diet.  SYMPTOMS   Nausea and vomiting.   Confusion.   Lethargy.   Agitation.   Headache.   Twitching or shaking (seizures).   Unconsciousness.   Appetite loss.   Muscle weakness and cramping.  DIAGNOSIS  Hyponatremia is identified by a simple blood test. Your caregiver will perform a history and physical exam to try to find the cause and type of hyponatremia. Other tests may be needed to measure the amount of sodium in your blood and urine. TREATMENT  Treatment will depend on the cause.   Fluids may be given  through the vein (IV).   Medicines may be used to correct the sodium imbalance. If medicines are causing the problem, they will need to be adjusted.   Water or fluid intake may be restricted to restore proper balance.  The speed of correcting the sodium problem is very important. If the problem is corrected too fast, nerve damage (sometimes unchangeable) can happen. HOME CARE INSTRUCTIONS   Only take medicines as directed by your caregiver. Many medicines  can make hyponatremia worse. Discuss all your medicines with your caregiver.   Carefully follow any recommended diet, including any fluid restrictions.   You may be asked to repeat lab tests. Follow these directions.   Avoid alcohol and recreational drugs.  SEEK MEDICAL CARE IF:   You develop worsening nausea, fatigue, headache, confusion, or weakness.   Your original hyponatremia symptoms return.   You have problems following the recommended diet.  SEEK IMMEDIATE MEDICAL CARE IF:   You have a seizure.   You faint.   You have ongoing diarrhea or vomiting.  MAKE SURE YOU:   Understand these instructions.   Will watch your condition.   Will get help right away if you are not doing well or get worse.  Document Released: 02/19/2002 Document Revised: 02/18/2011 Document Reviewed: 08/16/2010 [facility] Patient Information 2012 [place], [place].

## 2011-08-31 ENCOUNTER — Encounter: Payer: Self-pay | Admitting: Neurosurgery

## 2011-09-01 ENCOUNTER — Ambulatory Visit (INDEPENDENT_AMBULATORY_CARE_PROVIDER_SITE_OTHER): Payer: Medicare Other | Admitting: Neurosurgery

## 2011-09-01 ENCOUNTER — Encounter: Payer: Self-pay | Admitting: Neurosurgery

## 2011-09-01 ENCOUNTER — Ambulatory Visit (INDEPENDENT_AMBULATORY_CARE_PROVIDER_SITE_OTHER): Payer: Medicare Other | Admitting: *Deleted

## 2011-09-01 VITALS — BP 108/50 | HR 56 | Resp 16 | Ht 62.0 in | Wt 155.0 lb

## 2011-09-01 DIAGNOSIS — Z48812 Encounter for surgical aftercare following surgery on the circulatory system: Secondary | ICD-10-CM

## 2011-09-01 DIAGNOSIS — I6529 Occlusion and stenosis of unspecified carotid artery: Secondary | ICD-10-CM

## 2011-09-01 NOTE — Progress Notes (Signed)
[facility] Carotid Office Note  CC: Six-month carotid Doppler for surveillance of known stenosis Referring Physician: Edilia Bo  History of Present Illness: 78 year old female patient of Dr. Adele Dan with a history of a left CEA in September 2011. The patient denies any signs or symptoms of CVA, TIA, amaurosis fugax or any neural deficit. The patient denies any new medical diagnoses or recent surgeries.  Past Medical History  Diagnosis Date  . Dementia   . CVA (cerebral vascular accident)   . COPD (chronic obstructive pulmonary disease)     Emphysema  . GERD (gastroesophageal reflux disease)   . Glaucoma   . Hyperlipidemia   . Insomnia   . Rhinitis 12/14/1999  . Depression 10/12/2009  . Cancer 07/18/2008    Breast cancer-Left  . Hypertension   . Arthritis 07/18/2008    osteoporosis  . Peripheral neuropathy 07/18/2008  . Cerebrovascular disease 08/05/2003    acute  . Lumbago 08/05/2003  . Edema 08/05/2003  . Cellulitis and abscess of leg 08/05/2003  . Peripheral vascular disease 08/05/2003  . Anxiety 08/05/2003  . Hypopotassemia 08/05/2003  . Anemia     ROS: [x]  Positive   [ ]  Denies    General: [ ]  Weight loss, [ ]  Fever, [ ]  chills Neurologic: [ ]  Dizziness, [ ]  Blackouts, [ ]  Seizure [ ]  Stroke, [ ]  "Mini stroke", [ ]  Slurred speech, [ ]  Temporary blindness; [ ]  weakness in arms or legs, [ ]  Hoarseness Cardiac: [ ]  Chest pain/pressure, [ ]  Shortness of breath at rest [ ]  Shortness of breath with exertion, [ ]  Atrial fibrillation or irregular heartbeat Vascular: [ ]  Pain in legs with walking, [ ]  Pain in legs at rest, [ ]  Pain in legs at night,  [ ]  Non-healing ulcer, [ ]  Blood clot in vein/DVT,   Pulmonary: [ ]  Home oxygen, [ ]  Productive cough, [ ]  Coughing up blood, [ ]  Asthma,  [ ]  Wheezing Musculoskeletal:  [ ]  Arthritis, [ ]  Low back pain, [ ]  Joint pain Hematologic: [ ]  Easy Bruising, [ ]  Anemia; [ ]  Hepatitis Gastrointestinal: [ ]  Blood in  stool, [ ]  Gastroesophageal Reflux/heartburn, [ ]  Trouble swallowing Urinary: [ ]  chronic Kidney disease, [ ]  on HD - [ ]  MWF or [ ]  TTHS, [ ]  Burning with urination, [ ]  Difficulty urinating Skin: [ ]  Rashes, [ ]  Wounds Psychological: [ ]  Anxiety, [ ]  Depression   Social History History  Substance Use Topics  . Smoking status: Former Smoker    Types: Cigarettes    Quit date: 03/16/1999  . Smokeless tobacco: Not on file  . Alcohol Use: No    Family History No family history on file.  Allergies  Allergen Reactions  . Lyrica (Pregabalin)   . Morphine And Related   . Penicillins   . Sulfa Antibiotics     Current Outpatient Prescriptions  Medication Sig Dispense Refill  . amLODipine (NORVASC) 10 MG tablet Take 10 mg by mouth daily.      Marland Kitchen azelastine (ASTELIN) 137 MCG/SPRAY nasal spray Place 1 spray into both nostrils 2 (two) times daily. Use in each nostril as directed      . brimonidine (ALPHAGAN P) 0.1 % SOLN Place 1 drop into both eyes 2 (two) times daily.      . calcium-vitamin D (OSCAL WITH D) 500-200 MG-UNIT per tablet Take 1 tablet by mouth 2 (two) times daily.       . chlordiazePOXIDE (LIBRIUM) 5 MG  capsule Take 5 mg by mouth 3 (three) times daily as needed. Dc when current supply exhusted      . cholecalciferol (VITAMIN D) 400 UNITS TABS Take 400 Units by mouth 2 (two) times daily.      . clonazePAM (KLONOPIN) 0.5 MG tablet Take 0.5 mg by mouth 3 (three) times daily as needed. For anxiety      . clonazePAM (KLONOPIN) 1 MG tablet Take 1 mg by mouth 3 (three) times daily as needed. For anxiety      . cloNIDine (CATAPRES) 0.1 MG tablet Take 0.1 mg by mouth 2 (two) times daily as needed. For SBP> 180 & DBP > 95      . cycloSPORINE (RESTASIS) 0.05 % ophthalmic emulsion Place 1 drop into both eyes 2 (two) times daily.      Marland Kitchen dipyridamole-aspirin (AGGRENOX) 25-200 MG per 12 hr capsule Take 1 capsule by mouth 2 (two) times daily.      . DULoxetine (CYMBALTA) 30 MG capsule Take 30  mg by mouth daily. Takes with 60 mg for total of 90mg       . DULoxetine (CYMBALTA) 60 MG capsule Take 60 mg by mouth daily. Takes with 30mg  for total of 90mg       . fexofenadine (ALLEGRA) 180 MG tablet Take 180 mg by mouth daily.      Marland Kitchen gabapentin (NEURONTIN) 400 MG capsule Take 400 mg by mouth 3 (three) times daily.      Marland Kitchen lidocaine (LIDODERM) 5 % Place 1 patch onto the skin daily. Apply to back  -  Remove & Discard patch within 12 hours or as directed by MD      . losartan (COZAAR) 100 MG tablet Take 100 mg by mouth daily.      . meclizine (ANTIVERT) 25 MG tablet Take 25 mg by mouth every 6 (six) hours as needed.      . memantine (NAMENDA) 10 MG tablet Take 10 mg by mouth 2 (two) times daily.      . metaxalone (SKELAXIN) 800 MG tablet Take 400 mg by mouth every 6 (six) hours as needed. For muscle spasms      . Multiple Vitamin (MULTIVITAMIN WITH MINERALS) TABS Take 1 tablet by mouth daily.      Marland Kitchen olopatadine (PATANOL) 0.1 % ophthalmic solution Place 1 drop into both eyes 2 (two) times daily.       Marland Kitchen omega-3 acid ethyl esters (LOVAZA) 1 G capsule Take 2 g by mouth 2 (two) times daily.      Marland Kitchen omeprazole (PRILOSEC) 20 MG capsule Take 20 mg by mouth daily.      Marland Kitchen oxyCODONE (OXYCONTIN) 10 MG 12 hr tablet Take 10 mg by mouth 3 (three) times daily.      Marland Kitchen oxyCODONE-acetaminophen (PERCOCET) 5-325 MG per tablet Take 2 tablets by mouth every 6 (six) hours as needed. For pain      . promethazine (PHENERGAN) 25 MG tablet Take 25 mg by mouth every 6 (six) hours as needed. For nausea      . QUEtiapine (SEROQUEL) 50 MG tablet Take 50 mg by mouth 2 (two) times daily.       . QUEtiapine (SEROQUEL) 50 MG tablet Take 50 mg by mouth daily as needed. For delusions/ agitation      . Tafluprost (ZIOPTAN) 0.0015 % SOLN Place 1 drop into both eyes at bedtime.      Marland Kitchen ZOLMitriptan (ZOMIG) 2.5 MG tablet Take 2.5 mg by mouth as needed. For migraine      .  zolpidem (AMBIEN) 5 MG tablet Take 5 mg by mouth at bedtime as  needed. For sleep        Physical Examination  Filed Vitals:   09/01/11 1155  BP: 108/50  Pulse: 56  Resp: 16    Body mass index is 28.35 kg/(m^2).  General:  WDWN in NAD Gait: Normal HEENT: WNL Eyes: Pupils equal Pulmonary: normal non-labored breathing , without Rales, rhonchi,  wheezing Cardiac: RRR, without  Murmurs, rubs or gallops; Abdomen: soft, NT, no masses Skin: no rashes, ulcers noted  Vascular Exam Pulses: 2+ radial pulses bilaterally Carotid bruits: Carotid pulse to auscultation on the left, right carotid bruit is heard Extremities without ischemic changes, no Gangrene , no cellulitis; no open wounds;  Musculoskeletal: no muscle wasting or atrophy   Neurologic: A&O X 3; Appropriate Affect ; SENSATION: normal; MOTOR FUNCTION:  moving all extremities equally. Speech is fluent/normal  Non-Invasive Vascular Imaging CAROTID D60 - 79 %asm carotid art exam %:30973} stenosis Left ICA 0 - 19% stenosis   ASSESSMENT/PLAN: This patient has increased plaque percentage on the right at 60-79%, therefore we will follow her with surveillance every 6 months. Her questions were encouraged and answered she is in agreement with this plan.  Lauree Chandler ANP   Clinic MD: Edilia Bo

## 2011-09-08 NOTE — Procedures (Unsigned)
CAROTID DUPLEX EXAM  INDICATION:  Carotid endarterectomy.  HISTORY: Diabetes:  No Cardiac:  No Hypertension:  No Smoking:  Previous Previous Surgery:  Left carotid endarterectomy on 12/05/2009, left mastectomy. CV History:  Currently asymptomatic. Amaurosis Fugax No, Paresthesias No, Hemiparesis No                                      RIGHT             LEFT Brachial systolic pressure: Brachial Doppler waveforms: Vertebral direction of flow:        Antegrade         Antegrade DUPLEX VELOCITIES (cm/sec) CCA peak systolic                   52                113 ECA peak systolic                   291               137 ICA peak systolic                   337               43 ICA end diastolic                   86                9 PLAQUE MORPHOLOGY:                  Calcific          Heterogeneous PLAQUE AMOUNT:                      Moderate/severe.  Mild. PLAQUE LOCATION:                    ICA/ECA/CCA       CCA  IMPRESSION:  Doppler velocities suggest a 60% to 79% stenosis of the right proximal internal carotid artery, patent left carotid endarterectomy site with no left ICA stenosis. Right external carotid artery stenosis noted. Velocities of the right internal carotid artery appear increased when compared to the previous exam on 09/03/2010, with no significant change in the left internal carotid artery.  ___________________________________________ Di Kindle. Edilia Bo, M.D.  CH/MEDQ  D:  09/03/2011  T:  09/03/2011  Job:  [id number]

## 2011-09-23 IMAGING — CT CT HEAD W/O CM
1 series · 16 of 30 positions shown, 20 images · non-contrast
Comparison: 12/04/2009

CLINICAL DATA: Headache, bilateral upper extremity numbness,
weakness and blurred vision.

CT HEAD WITHOUT CONTRAST
TECHNIQUE: Contiguous axial images were obtained from the base of
the skull through the vertex without contrast.

[Series 2: head_seq 4.5 h37s st · axial · 0.43mm/px · z∈[-162,-36]mm · 16 of 32 slices shown, 20 images]
[im 2/32  brain]
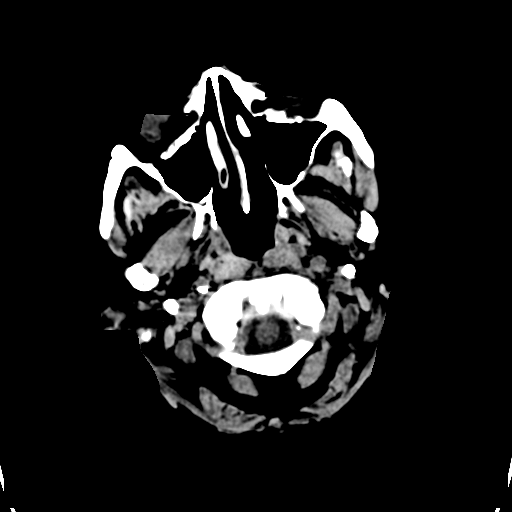
[im 2/32  bone]
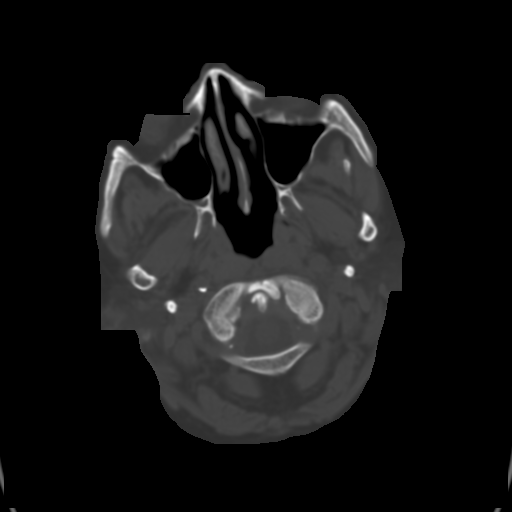
[im 4/32  brain]
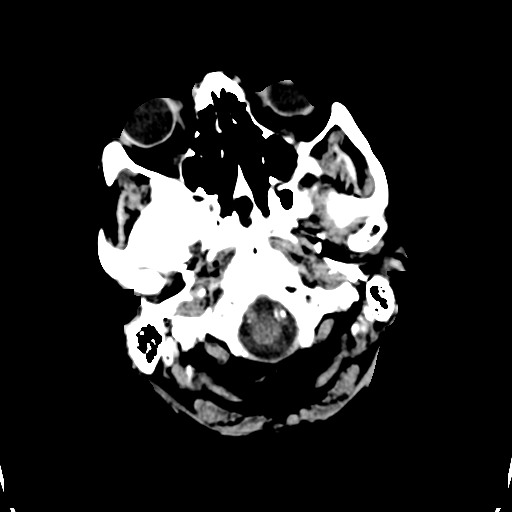
[im 6/32  brain]
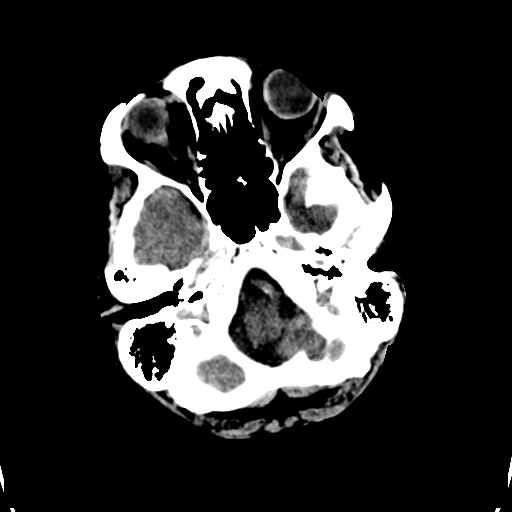
[im 8/32  brain]
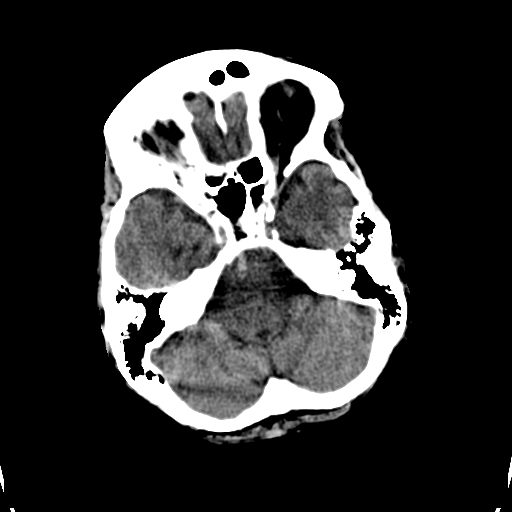
[im 9/32  brain]
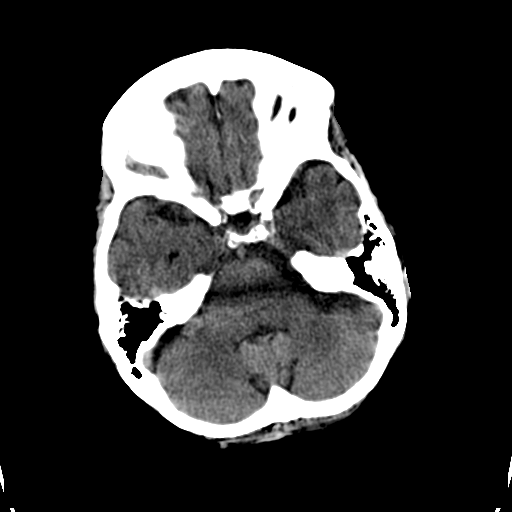
[im 9/32  bone]
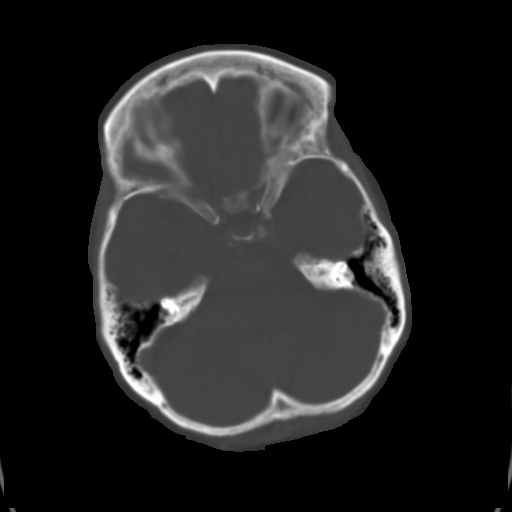
[im 11/32  brain]
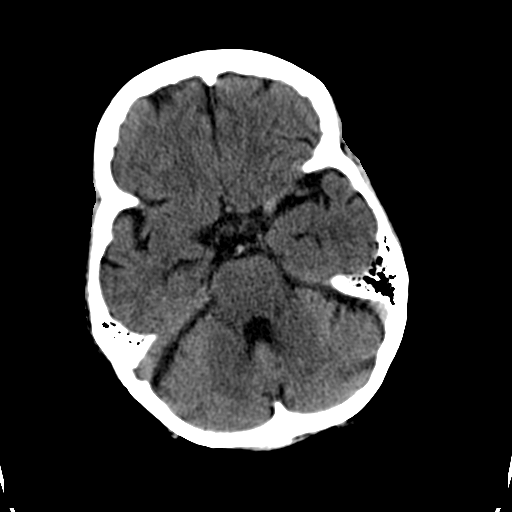
[im 13/32  brain]
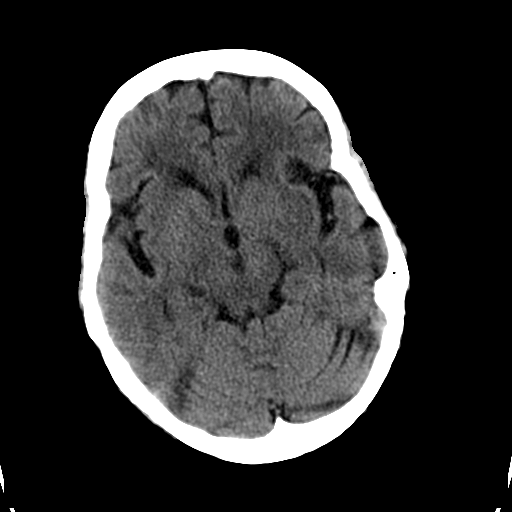
[im 15/32  brain]
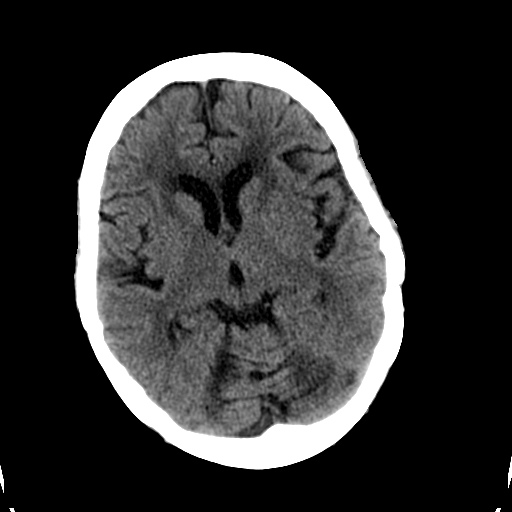
[im 17/32  brain]
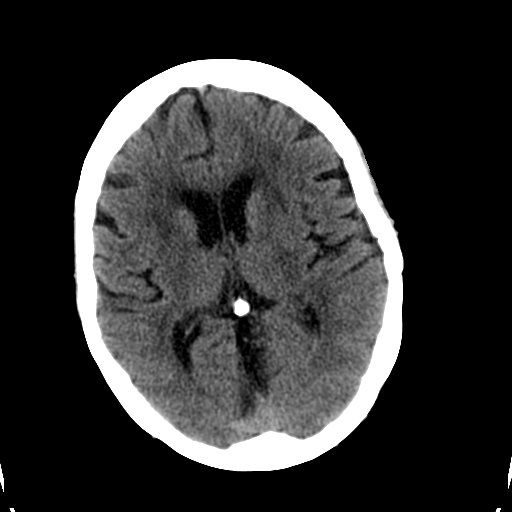
[im 17/32  bone]
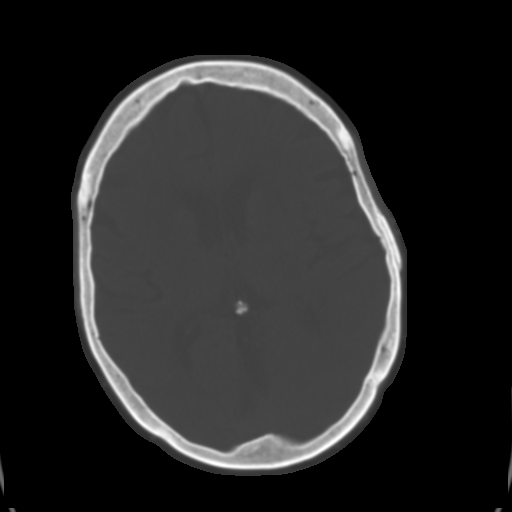
[im 19/32  brain]
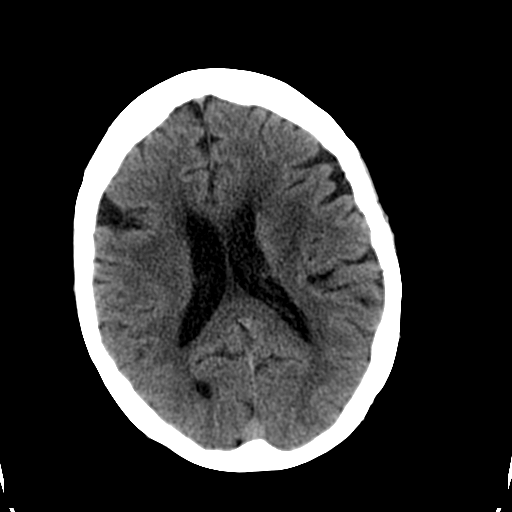
[im 21/32  brain]
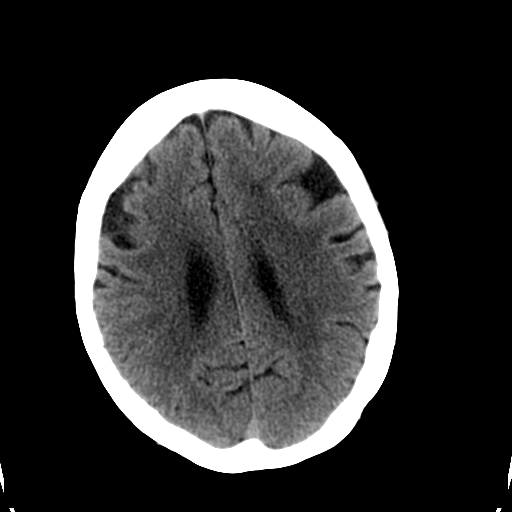
[im 23/32  brain]
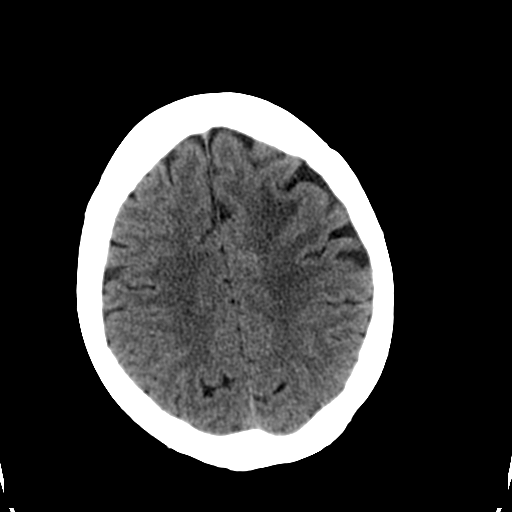
[im 24/32  brain]
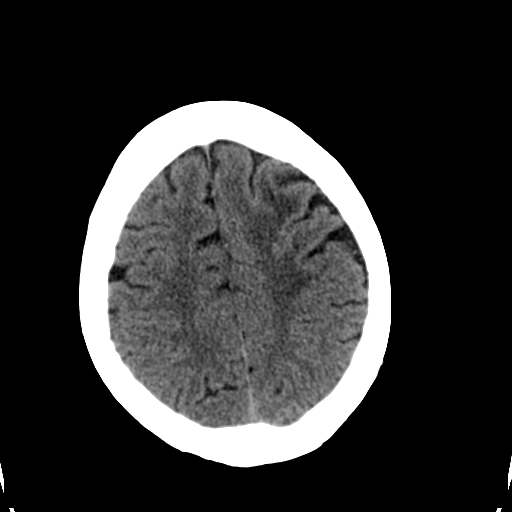
[im 24/32  bone]
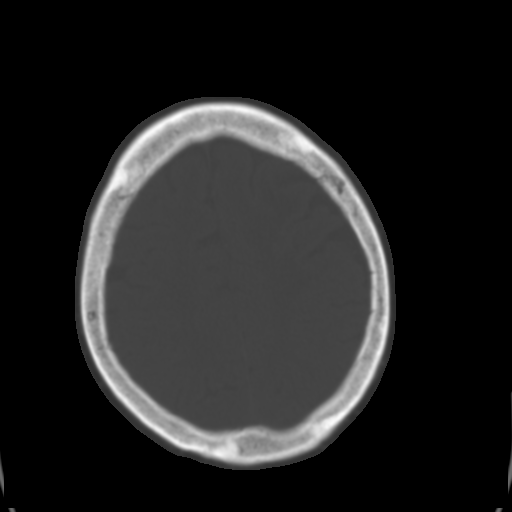
[im 26/32  brain]
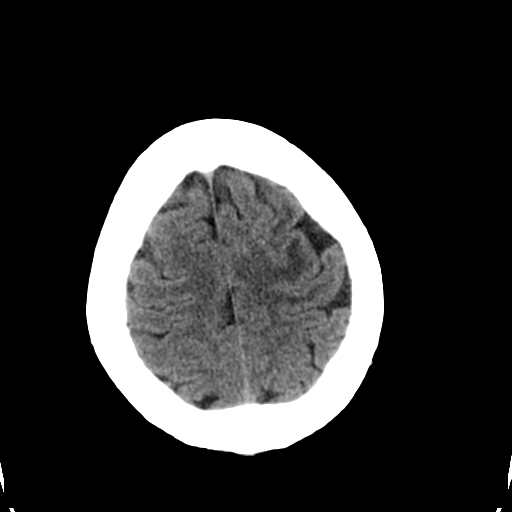
[im 28/32  brain]
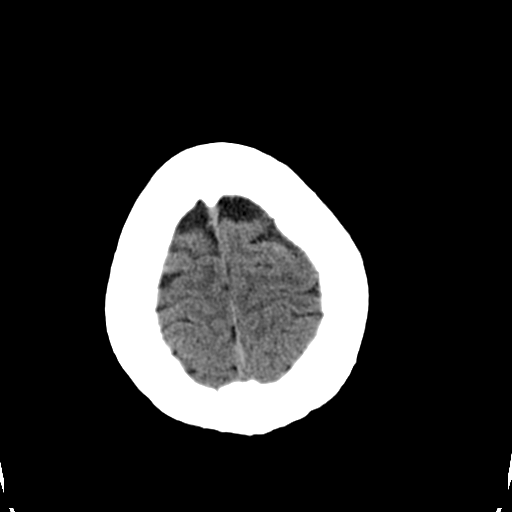
[im 30/32  brain]
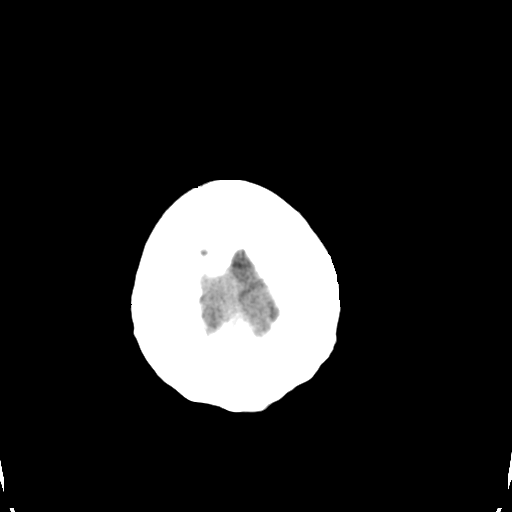

[16 of 30 positions shown; findings below may reference images not displayed]

FINDINGS: Stable advanced small vessel ischemic changes in the
periventricular white matter bilaterally.  No visible acute or
subacute infarction.  No hemorrhage, mass effect, abnormal edema,
hydrocephalus or extra-axial fluid collections.  No evidence of
mass lesion.  The skull is unremarkable.
IMPRESSION: Stable small vessel ischemic disease.  No acute infarct identified.

## 2011-10-24 ENCOUNTER — Encounter (HOSPITAL_COMMUNITY): Payer: Self-pay | Admitting: Emergency Medicine

## 2011-10-24 ENCOUNTER — Emergency Department (HOSPITAL_COMMUNITY)
Admission: EM | Admit: 2011-10-24 | Discharge: 2011-10-25 | Disposition: A | Discharge status: Home / Self Care | Payer: Medicare Other | Attending: Emergency Medicine | Admitting: Emergency Medicine

## 2011-10-24 ENCOUNTER — Emergency Department (HOSPITAL_COMMUNITY): Payer: Medicare Other

## 2011-10-24 DIAGNOSIS — Z7982 Long term (current) use of aspirin: Secondary | ICD-10-CM | POA: Insufficient documentation

## 2011-10-24 DIAGNOSIS — J449 Chronic obstructive pulmonary disease, unspecified: Secondary | ICD-10-CM | POA: Insufficient documentation

## 2011-10-24 DIAGNOSIS — F3289 Other specified depressive episodes: Secondary | ICD-10-CM | POA: Insufficient documentation

## 2011-10-24 DIAGNOSIS — Z79899 Other long term (current) drug therapy: Secondary | ICD-10-CM | POA: Insufficient documentation

## 2011-10-24 DIAGNOSIS — K219 Gastro-esophageal reflux disease without esophagitis: Secondary | ICD-10-CM | POA: Insufficient documentation

## 2011-10-24 DIAGNOSIS — I10 Essential (primary) hypertension: Secondary | ICD-10-CM | POA: Insufficient documentation

## 2011-10-24 DIAGNOSIS — J4489 Other specified chronic obstructive pulmonary disease: Secondary | ICD-10-CM | POA: Insufficient documentation

## 2011-10-24 DIAGNOSIS — Z853 Personal history of malignant neoplasm of breast: Secondary | ICD-10-CM | POA: Insufficient documentation

## 2011-10-24 DIAGNOSIS — R42 Dizziness and giddiness: Secondary | ICD-10-CM | POA: Insufficient documentation

## 2011-10-24 DIAGNOSIS — F329 Major depressive disorder, single episode, unspecified: Secondary | ICD-10-CM | POA: Insufficient documentation

## 2011-10-24 DIAGNOSIS — E785 Hyperlipidemia, unspecified: Secondary | ICD-10-CM | POA: Insufficient documentation

## 2011-10-24 DIAGNOSIS — Z8673 Personal history of transient ischemic attack (TIA), and cerebral infarction without residual deficits: Secondary | ICD-10-CM | POA: Insufficient documentation

## 2011-10-24 DIAGNOSIS — E871 Hypo-osmolality and hyponatremia: Secondary | ICD-10-CM | POA: Insufficient documentation

## 2011-10-24 DIAGNOSIS — F0390 Unspecified dementia without behavioral disturbance: Secondary | ICD-10-CM | POA: Insufficient documentation

## 2011-10-24 LAB — CBC WITH DIFFERENTIAL/PLATELET
Basophils Absolute: 0 10*3/uL (ref 0.0–0.1)
Basophils Relative: 0 % (ref 0–1)
Eosinophils Absolute: 0.2 10*3/uL (ref 0.0–0.7)
Eosinophils Relative: 2 % (ref 0–5)
MCH: 31.3 pg (ref 26.0–34.0)
MCV: 91 fL (ref 78.0–100.0)
Platelets: 282 10*3/uL (ref 150–400)
RDW: 13 % (ref 11.5–15.5)

## 2011-10-24 LAB — URINALYSIS, ROUTINE W REFLEX MICROSCOPIC
Bilirubin Urine: NEGATIVE
Hgb urine dipstick: NEGATIVE
Protein, ur: NEGATIVE mg/dL
Urobilinogen, UA: 0.2 mg/dL (ref 0.0–1.0)

## 2011-10-24 LAB — COMPREHENSIVE METABOLIC PANEL
ALT: 11 U/L (ref 0–35)
AST: 20 U/L (ref 0–37)
Calcium: 9.4 mg/dL (ref 8.4–10.5)
GFR calc Af Amer: 65 mL/min — ABNORMAL LOW (ref >90)
Sodium: 131 mEq/L — ABNORMAL LOW (ref 135–145)
Total Protein: 7.1 g/dL (ref 6.0–8.3)

## 2011-10-24 LAB — URINE MICROSCOPIC-ADD ON

## 2011-10-24 MED ORDER — MECLIZINE HCL 25 MG PO TABS
25.0000 mg | ORAL_TABLET | Freq: Once | ORAL | Status: AC
  Administered 2011-10-24: 25 mg via ORAL
  Filled 2011-10-24: qty 1

## 2011-10-24 NOTE — ED Notes (Signed)
Bed:WHALB<BR> Expected date:<BR> Expected time:<BR> Means of arrival:<BR> Comments:<BR> CLOSED

## 2011-10-24 NOTE — ED Provider Notes (Signed)
History     CSN: [id number]  Arrival date & time 10/24/11  2040   First MD Initiated Contact with Patient 10/24/11 2118      Chief Complaint  Patient presents with  . Dizziness   Level V caveat for dementia  (Consider location/radiation/quality/duration/timing/severity/associated sxs/prior treatment) HPI Patient presents via the emergency department by EMS. She relates this afternoon or evening she had acute onset of diffuse sweating. She states she's unaware right now if she had pain or not. She does state she has some pain in her left posterior chest. She states she feels dizzy and has some spinning and she has some mild frontal headache. She is otherwise and able to give any details.  PCP Dr Marijo Conception  Past Medical History  Diagnosis Date  . Dementia   . CVA (cerebral vascular accident)   . COPD (chronic obstructive pulmonary disease)     Emphysema  . GERD (gastroesophageal reflux disease)   . Glaucoma   . Hyperlipidemia   . Insomnia   . Rhinitis 12/14/1999  . Depression 10/12/2009  . Cancer 07/18/2008    Breast cancer-Left  . Hypertension   . Arthritis 07/18/2008    osteoporosis  . Peripheral neuropathy 07/18/2008  . Cerebrovascular disease 08/05/2003    acute  . Lumbago 08/05/2003  . Edema 08/05/2003  . Cellulitis and abscess of leg 08/05/2003  . Peripheral vascular disease 08/05/2003  . Anxiety 08/05/2003  . Hypopotassemia 08/05/2003  . Anemia     Past Surgical History  Procedure Date  . Carotid endarterectomy 2011    Left CEA  . Breast surgery   . Eye surgery 07/18/2008    cataract  . Fracture surgery 07/18/2008    closed  part neck femur    History reviewed. No pertinent family history.  History  Substance Use Topics  . Smoking status: Former Smoker    Types: Cigarettes    Quit date: 03/16/1999  . Smokeless tobacco: Not on file  . Alcohol Use: No   lives in nursing home  OB History    Grav Para Term Preterm Abortions TAB SAB Ect Mult Living                  Review of Systems  Unable to perform ROS: Dementia    Allergies  Lyrica; Morphine and related; Penicillins; and Sulfa antibiotics  Home Medications   Current Outpatient Rx  Name Route Sig Dispense Refill  . AMLODIPINE BESYLATE 10 MG PO TABS Oral Take 10 mg by mouth daily.    . AZELASTINE HCL 137 MCG/SPRAY NA SOLN Each Nare Place 1 spray into both nostrils 2 (two) times daily. Use in each nostril as directed    . BRIMONIDINE TARTRATE 0.1 % OP SOLN Both Eyes Place 1 drop into both eyes 2 (two) times daily.    Marland Kitchen CALCIUM CARBONATE-VITAMIN D 500-200 MG-UNIT PO TABS Oral Take 1 tablet by mouth 2 (two) times daily.     . CHOLECALCIFEROL 400 UNITS PO TABS Oral Take 400 Units by mouth 2 (two) times daily.    Marland Kitchen CLONAZEPAM 0.5 MG PO TABS Oral Take 0.5 mg by mouth 3 (three) times daily as needed. For anxiety    . CLONIDINE HCL 0.1 MG PO TABS Oral Take 0.1 mg by mouth 2 (two) times daily as needed. For SBP> 180 & DBP > 95    . CYCLOSPORINE 0.05 % OP EMUL Both Eyes Place 1 drop into both eyes 2 (two) times daily.    Marland Kitchen  DIPHENHYDRAMINE HCL 25 MG PO TABS Oral Take 25 mg by mouth every 6 (six) hours as needed. allergies    . ASPIRIN-DIPYRIDAMOLE ER 25-200 MG PO CP12 Oral Take 1 capsule by mouth 2 (two) times daily.    . DULOXETINE HCL 30 MG PO CPEP Oral Take 30 mg by mouth daily. Takes with 60 mg for total of 90mg     . DULOXETINE HCL 60 MG PO CPEP Oral Take 90 mg by mouth daily. Takes with 30mg  for total of 90mg     . FEXOFENADINE HCL 180 MG PO TABS Oral Take 180 mg by mouth daily.    Marland Kitchen GABAPENTIN 300 MG PO CAPS Oral Take 600 mg by mouth 3 (three) times daily.    Marland Kitchen LIDOCAINE 5 % EX PTCH Transdermal Place 1 patch onto the skin daily. Apply to back  -  Remove & Discard patch within 12 hours or as directed by MD    . Claris Gladden POTASSIUM 100 MG PO TABS Oral Take 100 mg by mouth daily.    . LUBIPROSTONE 8 MCG PO CAPS Oral Take 8 mcg by mouth 2 (two) times daily with a meal. Fors constipation    . MECLIZINE  HCL 25 MG PO TABS Oral Take 25 mg by mouth every 6 (six) hours as needed.    Marland Kitchen MEMANTINE HCL 10 MG PO TABS Oral Take 10 mg by mouth 2 (two) times daily.    Marland Kitchen METAXALONE 800 MG PO TABS Oral Take 400 mg by mouth every 6 (six) hours as needed. For muscle spasms    . ADULT MULTIVITAMIN W/MINERALS CH Oral Take 1 tablet by mouth daily.    . OLOPATADINE HCL 0.1 % OP SOLN Both Eyes Place 1 drop into both eyes 2 (two) times daily.     . OMEGA-3-ACID ETHYL ESTERS 1 G PO CAPS Oral Take 2 g by mouth 2 (two) times daily.    Marland Kitchen OMEPRAZOLE 20 MG PO CPDR Oral Take 20 mg by mouth daily.    . OXYCODONE HCL ER 10 MG PO TB12 Oral Take 10 mg by mouth 3 (three) times daily.    . OXYCODONE-ACETAMINOPHEN 5-325 MG PO TABS Oral Take 2 tablets by mouth every 6 (six) hours as needed. For pain    . PROMETHAZINE HCL 25 MG PO TABS Oral Take 25 mg by mouth every 6 (six) hours as needed. For nausea    . QUETIAPINE FUMARATE 50 MG PO TABS Oral Take 50 mg by mouth 2 (two) times daily.     . QUETIAPINE FUMARATE 50 MG PO TABS Oral Take 50 mg by mouth daily as needed. For delusions/ agitation    . SIMVASTATIN 20 MG PO TABS Oral Take 20 mg by mouth at bedtime.    . TAFLUPROST 0.0015 % OP SOLN Both Eyes Place 1 drop into both eyes at bedtime.    Marland Kitchen ZOLMITRIPTAN 2.5 MG PO TABS Oral Take 2.5 mg by mouth as needed. For migraine    . ZOLPIDEM TARTRATE 5 MG PO TABS Oral Take 5 mg by mouth at bedtime as needed. For sleep      BP 100/52  Pulse 69  Temp 98.5 F (36.9 C) (Oral)  Resp 15  Ht 5\' 2"  (1.575 m)  Wt 144 lb (65.318 kg)  BMI 26.34 kg/m2  SpO2 95%  Vital signs normal    Physical Exam  Nursing note and vitals reviewed. Constitutional: She is oriented to person, place, and time. She appears well-developed and well-nourished.  Non-toxic  appearance. She does not appear ill. No distress.  HENT:  Head: Normocephalic and atraumatic.  Right Ear: External ear normal.  Left Ear: External ear normal.  Nose: Nose normal. No mucosal  edema or rhinorrhea.  Mouth/Throat: Oropharynx is clear and moist and mucous membranes are normal. No dental abscesses or uvula swelling.  Eyes: Conjunctivae and EOM are normal. Pupils are equal, round, and reactive to light.  Neck: Normal range of motion and full passive range of motion without pain. Neck supple.  Cardiovascular: Normal rate, regular rhythm and normal heart sounds.  Exam reveals no gallop and no friction rub.   No murmur heard. Pulmonary/Chest: Effort normal and breath sounds normal. No respiratory distress. She has no wheezes. She has no rhonchi. She has no rales. She exhibits no tenderness and no crepitus.  Abdominal: Soft. Normal appearance and bowel sounds are normal. She exhibits no distension. There is no tenderness. There is no rebound and no guarding.  Musculoskeletal: Normal range of motion. She exhibits no edema and no tenderness.       Moves all extremities well.   Neurological: She is alert and oriented to person, place, and time. She has normal strength. No cranial nerve deficit.       No focal deficit  Skin: Skin is warm, dry and intact. No rash noted. No erythema. No pallor.  Psychiatric: She has a normal mood and affect. Her speech is normal and behavior is normal. Her mood appears not anxious.    ED Course  Procedures (including critical care time)   Medications  meclizine (ANTIVERT) tablet 25 mg (25 mg Oral Given 10/24/11 2219)   Recheck at discharge states she feels fine, feels ready to go home.    Results for orders placed during the hospital encounter of 10/24/11  URINALYSIS, ROUTINE W REFLEX MICROSCOPIC      Component Value Range   Color, Urine YELLOW  YELLOW   APPearance CLOUDY (*) CLEAR   Specific Gravity, Urine 1.014  1.005 - 1.030   pH 6.5  5.0 - 8.0   Glucose, UA NEGATIVE  NEGATIVE mg/dL   Hgb urine dipstick NEGATIVE  NEGATIVE   Bilirubin Urine NEGATIVE  NEGATIVE   Ketones, ur NEGATIVE  NEGATIVE mg/dL   Protein, ur NEGATIVE  NEGATIVE  mg/dL   Urobilinogen, UA 0.2  0.0 - 1.0 mg/dL   Nitrite NEGATIVE  NEGATIVE   Leukocytes, UA SMALL (*) NEGATIVE  CBC WITH DIFFERENTIAL      Component Value Range   WBC 9.0  4.0 - 10.5 K/uL   RBC 4.12  3.87 - 5.11 MIL/uL   Hemoglobin 12.9  12.0 - 15.0 g/dL   HCT 54.0  98.1 - 19.1 %   MCV 91.0  78.0 - 100.0 fL   MCH 31.3  26.0 - 34.0 pg   MCHC 34.4  30.0 - 36.0 g/dL   RDW 47.8  29.5 - 62.1 %   Platelets 282  150 - 400 K/uL   Neutrophils Relative 73  43 - 77 %   Neutro Abs 6.6  1.7 - 7.7 K/uL   Lymphocytes Relative 18  12 - 46 %   Lymphs Abs 1.6  0.7 - 4.0 K/uL   Monocytes Relative 7  3 - 12 %   Monocytes Absolute 0.6  0.1 - 1.0 K/uL   Eosinophils Relative 2  0 - 5 %   Eosinophils Absolute 0.2  0.0 - 0.7 K/uL   Basophils Relative 0  0 - 1 %  Basophils Absolute 0.0  0.0 - 0.1 K/uL  COMPREHENSIVE METABOLIC PANEL      Component Value Range   Sodium 131 (*) 135 - 145 mEq/L   Potassium 3.6  3.5 - 5.1 mEq/L   Chloride 92 (*) 96 - 112 mEq/L   CO2 29  19 - 32 mEq/L   Glucose, Bld 109 (*) 70 - 99 mg/dL   BUN 12  6 - 23 mg/dL   Creatinine, Ser 1.61  0.50 - 1.10 mg/dL   Calcium 9.4  8.4 - 09.6 mg/dL   Total Protein 7.1  6.0 - 8.3 g/dL   Albumin 3.7  3.5 - 5.2 g/dL   AST 20  0 - 37 U/L   ALT 11  0 - 35 U/L   Alkaline Phosphatase 74  39 - 117 U/L   Total Bilirubin 0.3  0.3 - 1.2 mg/dL   GFR calc non Af Amer 56 (*) >90 mL/min   GFR calc Af Amer 65 (*) >90 mL/min  TROPONIN I      Component Value Range   Troponin I <0.30  <0.30 ng/mL  D-DIMER, QUANTITATIVE      Component Value Range   D-Dimer, Quant <0.22  0.00 - 0.48 ug/mL-FEU  URINE MICROSCOPIC-ADD ON      Component Value Range   Squamous Epithelial / LPF RARE  RARE   WBC, UA 11-20  <3 WBC/hpf   RBC / HPF 0-2  <3 RBC/hpf   Bacteria, UA MANY (*) RARE   Laboratory interpretation all normal except hyponatremia similar to [place], possible UTI   Dg Chest Portable 1 View  10/24/2011  *RADIOLOGY REPORT*  Clinical Data: Dizzy, chest  pain.  PORTABLE CHEST - 1 VIEW  Comparison: 08/30/2011  Findings: Atheromatous aorta.  Coarse bibasilar interstitial opacities as before.  No confluent airspace infiltrate or overt edema.  No effusion.  Heart size normal.  Regional bones unremarkable.  IMPRESSION:  1.  Chronic bibasilar interstitial changes.  No acute disease.  Original Report Authenticated By: Osa Craver, M.D.     Date: 10/24/2011  Rate: 66  Rhythm: normal sinus rhythm  QRS Axis: normal  Intervals: normal  ST/T Wave abnormalities: normal  Conduction Disutrbances:none  Narrative Interpretation: low voltage, Q waves anteriorly  Old EKG Reviewed: none available    1. Dizziness   2. Hyponatremia   3. Poss UTI urine culture pending  New Prescriptions   MECLIZINE (ANTIVERT) 25 MG TABLET    Take 1 tablet (25 mg total) by mouth 3 (three) times daily as needed for dizziness.     Plan discharge   Devoria Albe, MD, FACEP   MDM          Ward Givens, MD 10/25/11 [phone]

## 2011-10-24 NOTE — ED Notes (Signed)
Per EMS, pt was at [facility] when she became diaphoretic about 2h ago. Pt was laying on her bed and c/o dizziness when EMS arrived. They checked BP upon sitting with no orthostatic changes. Per EMS, staff stated that the pt takes Oxycodone q4h and is often asking for additional doses. Pt is A&Ox4, in NAD.

## 2011-10-25 MED ORDER — MECLIZINE HCL 25 MG PO TABS: 25.0000 mg | ORAL_TABLET | Freq: Three times a day (TID) | ORAL | Status: AC | PRN

## 2011-10-25 NOTE — ED Notes (Signed)
Report called Obas, RN to [facility]. [facility] called for transport.

## 2011-10-27 LAB — URINE CULTURE: Colony Count: >=100000

## 2011-10-28 NOTE — ED Notes (Signed)
+   urine  Patient treated appropriately -sensitive to same-chart appended per protocol MD.  

## 2011-10-30 NOTE — ED Notes (Signed)
Chart returned from EDP office . Rx for Cipro 250 mg.1 tab po BID #6 x 3 days written by Grant Fontana PAC.

## 2011-11-01 NOTE — ED Notes (Signed)
I have been unable to reach this patient by phone.

## 2011-11-07 NOTE — ED Notes (Signed)
Attempted to contact patient. Phone number has been disconnected. Sent letter.

## 2011-11-11 NOTE — ED Notes (Signed)
Copy of labs faxed to [facility].

## 2012-06-14 ENCOUNTER — Other Ambulatory Visit: Payer: Self-pay | Admitting: *Deleted

## 2012-06-14 MED ORDER — OXYCODONE HCL 10 MG PO TB12: ORAL_TABLET | ORAL | Status: DC

## 2012-07-17 ENCOUNTER — Other Ambulatory Visit: Payer: Self-pay | Admitting: Geriatric Medicine

## 2012-07-17 MED ORDER — OXYCODONE HCL 10 MG PO TB12: 10.0000 mg | ORAL_TABLET | Freq: Three times a day (TID) | ORAL | Status: DC

## 2012-08-13 DEATH — deceased

## 2012-08-16 ENCOUNTER — Other Ambulatory Visit: Payer: Self-pay | Admitting: *Deleted

## 2012-08-16 MED ORDER — OXYCODONE HCL 10 MG PO TB12: ORAL_TABLET | ORAL | Status: DC

## 2012-09-07 ENCOUNTER — Other Ambulatory Visit: Payer: Self-pay | Admitting: *Deleted

## 2012-09-07 MED ORDER — CLONAZEPAM 0.5 MG PO TABS: ORAL_TABLET | ORAL | Status: DC

## 2012-09-14 ENCOUNTER — Other Ambulatory Visit: Payer: Self-pay | Admitting: Geriatric Medicine

## 2012-09-14 MED ORDER — OXYCODONE HCL 10 MG PO TB12: ORAL_TABLET | ORAL | Status: DC

## 2012-10-02 ENCOUNTER — Non-Acute Institutional Stay (SKILLED_NURSING_FACILITY): Payer: Medicare Other | Admitting: Adult Health

## 2012-10-02 DIAGNOSIS — F32A Depression, unspecified: Secondary | ICD-10-CM | POA: Insufficient documentation

## 2012-10-02 DIAGNOSIS — G629 Polyneuropathy, unspecified: Secondary | ICD-10-CM | POA: Insufficient documentation

## 2012-10-02 DIAGNOSIS — K219 Gastro-esophageal reflux disease without esophagitis: Secondary | ICD-10-CM

## 2012-10-02 DIAGNOSIS — J309 Allergic rhinitis, unspecified: Secondary | ICD-10-CM

## 2012-10-02 DIAGNOSIS — E785 Hyperlipidemia, unspecified: Secondary | ICD-10-CM

## 2012-10-02 DIAGNOSIS — G8929 Other chronic pain: Secondary | ICD-10-CM

## 2012-10-02 DIAGNOSIS — G4700 Insomnia, unspecified: Secondary | ICD-10-CM

## 2012-10-02 DIAGNOSIS — K5900 Constipation, unspecified: Secondary | ICD-10-CM | POA: Insufficient documentation

## 2012-10-02 DIAGNOSIS — I150 Renovascular hypertension: Secondary | ICD-10-CM | POA: Insufficient documentation

## 2012-10-02 DIAGNOSIS — F329 Major depressive disorder, single episode, unspecified: Secondary | ICD-10-CM

## 2012-10-02 DIAGNOSIS — F0150 Vascular dementia without behavioral disturbance: Secondary | ICD-10-CM | POA: Insufficient documentation

## 2012-10-02 DIAGNOSIS — M549 Dorsalgia, unspecified: Secondary | ICD-10-CM

## 2012-10-02 DIAGNOSIS — H409 Unspecified glaucoma: Secondary | ICD-10-CM

## 2012-10-02 DIAGNOSIS — I6990 Unspecified sequelae of unspecified cerebrovascular disease: Secondary | ICD-10-CM

## 2012-10-02 DIAGNOSIS — G609 Hereditary and idiopathic neuropathy, unspecified: Secondary | ICD-10-CM

## 2012-10-03 ENCOUNTER — Other Ambulatory Visit: Payer: Self-pay | Admitting: Geriatric Medicine

## 2012-10-03 MED ORDER — OXYCODONE HCL 5 MG PO TABA: 5.0000 mg | ORAL_TABLET | ORAL | Status: DC

## 2012-10-04 ENCOUNTER — Other Ambulatory Visit: Payer: Self-pay | Admitting: Geriatric Medicine

## 2012-10-04 MED ORDER — ZOLPIDEM TARTRATE 5 MG PO TABS: 5.0000 mg | ORAL_TABLET | Freq: Every evening | ORAL | Status: DC | PRN

## 2012-10-13 ENCOUNTER — Other Ambulatory Visit: Payer: Self-pay | Admitting: Geriatric Medicine

## 2012-10-13 MED ORDER — OXYCODONE HCL 10 MG PO TB12: ORAL_TABLET | ORAL | Status: DC

## 2012-11-14 ENCOUNTER — Other Ambulatory Visit: Payer: Self-pay | Admitting: *Deleted

## 2012-11-14 MED ORDER — OXYCODONE HCL ER 10 MG PO T12A: EXTENDED_RELEASE_TABLET | ORAL | Status: DC

## 2012-12-12 ENCOUNTER — Encounter: Payer: Self-pay | Admitting: Family

## 2012-12-12 ENCOUNTER — Non-Acute Institutional Stay (SKILLED_NURSING_FACILITY): Payer: Medicare Other | Admitting: Family

## 2012-12-12 DIAGNOSIS — F329 Major depressive disorder, single episode, unspecified: Secondary | ICD-10-CM

## 2012-12-12 DIAGNOSIS — I10 Essential (primary) hypertension: Secondary | ICD-10-CM

## 2012-12-12 DIAGNOSIS — K219 Gastro-esophageal reflux disease without esophagitis: Secondary | ICD-10-CM

## 2012-12-12 DIAGNOSIS — M549 Dorsalgia, unspecified: Secondary | ICD-10-CM

## 2012-12-12 DIAGNOSIS — F411 Generalized anxiety disorder: Secondary | ICD-10-CM

## 2012-12-12 DIAGNOSIS — H409 Unspecified glaucoma: Secondary | ICD-10-CM

## 2012-12-12 NOTE — Progress Notes (Signed)
Patient ID: Dorothy Collins, female   DOB: 18-May-1930, 79 y.o.   MRN: [id number]   Date: 12/12/12  Facility: [facility]  Code Status:  DNR  Chief Complaint  Patient presents with  . Acute Visit    s/p fall     HPI: Pt is being followed for the medical management of chronic illnesses. Nursing staff alerted NP, pt had a fall two days ago and the pt is complaining of AMS.  Nursing staff supports pt has been alert and oriented to person, place, time and situation with appropriate cognition manifested by good judgement.  Pt presents with c/o back pain and denies AMS. Pt reports acute on chronic back pain that is exacerbated with movement.  Pt denies trauma to head when fall was sustained. Pt reports falling on her buttocks. Pt denies N/V/D/fever/chills/malaise/HA/confusion/blurred vision/abraision, contusions or protrusions.  Pt reports having intermittent dizziness with rapid position change. Pt reports falling infrequently and reports 1 fall in the past 3 months; pt currently refuses radiographic exam .  Pt reports lower back pain is mitigated with Lidoderm patch and prn pain medication.  Pt endorses adhering to PT/OT treatment plan occasionally.     Medication List       This list is accurate as of: 12/12/12  4:18 PM.  Always use your most recent med list.               amLODipine 10 MG tablet  Commonly known as:  NORVASC  Take 10 mg by mouth daily.     azelastine 137 MCG/SPRAY nasal spray  Commonly known as:  ASTELIN  Place 1 spray into both nostrils 2 (two) times daily. Use in each nostril as directed     brimonidine 0.1 % Soln  Commonly known as:  ALPHAGAN P  Place 1 drop into both eyes 2 (two) times daily.     calcium-vitamin D 500-200 MG-UNIT per tablet  Commonly known as:  OSCAL WITH D  Take 1 tablet by mouth 2 (two) times daily.     cholecalciferol 400 UNITS Tabs tablet  Commonly known as:  VITAMIN D  Take 400 Units by mouth 2 (two) times daily.     clonazePAM 0.5 MG  tablet  Commonly known as:  KLONOPIN  Take one tablet by mouth three times daily for anxiety     cloNIDine 0.1 MG tablet  Commonly known as:  CATAPRES  Take 0.1 mg by mouth 2 (two) times daily as needed. For SBP> 180 & DBP > 95     cycloSPORINE 0.05 % ophthalmic emulsion  Commonly known as:  RESTASIS  Place 1 drop into both eyes 2 (two) times daily.     diphenhydrAMINE 25 MG tablet  Commonly known as:  BENADRYL  Take 25 mg by mouth every 6 (six) hours as needed. allergies     dipyridamole-aspirin 200-25 MG per 12 hr capsule  Commonly known as:  AGGRENOX  Take 1 capsule by mouth 2 (two) times daily.     DULoxetine 60 MG capsule  Commonly known as:  CYMBALTA  Take 90 mg by mouth daily. Takes with 30mg  for total of 90mg      DULoxetine 30 MG capsule  Commonly known as:  CYMBALTA  Take 30 mg by mouth daily. Takes with 60 mg for total of 90mg      fexofenadine 180 MG tablet  Commonly known as:  ALLEGRA  Take 180 mg by mouth daily.     gabapentin 300 MG capsule  Commonly  known as:  NEURONTIN  Take 600 mg by mouth 3 (three) times daily.     lidocaine 5 %  Commonly known as:  LIDODERM  Place 1 patch onto the skin daily. Apply to back  -  Remove & Discard patch within 12 hours or as directed by MD     losartan 100 MG tablet  Commonly known as:  COZAAR  Take 100 mg by mouth daily.     lubiprostone 8 MCG capsule  Commonly known as:  AMITIZA  Take 8 mcg by mouth 2 (two) times daily with a meal. Fors constipation     meclizine 25 MG tablet  Commonly known as:  ANTIVERT  Take 25 mg by mouth every 6 (six) hours as needed.     memantine 10 MG tablet  Commonly known as:  NAMENDA  Take 10 mg by mouth 2 (two) times daily.     metaxalone 800 MG tablet  Commonly known as:  SKELAXIN  Take 400 mg by mouth every 6 (six) hours as needed. For muscle spasms     multivitamin with minerals Tabs tablet  Take 1 tablet by mouth daily.     olopatadine 0.1 % ophthalmic solution  Commonly  known as:  PATANOL  Place 1 drop into both eyes 2 (two) times daily.     omega-3 acid ethyl esters 1 G capsule  Commonly known as:  LOVAZA  Take 2 g by mouth 2 (two) times daily.     omeprazole 20 MG capsule  Commonly known as:  PRILOSEC  Take 20 mg by mouth daily.     OxyCODONE HCl (Abuse Deter) 5 MG Taba  Take 5 mg by mouth every 4 (four) hours.     oxyCODONE 10 MG 12 hr tablet  Commonly known as:  OXYCONTIN  Take one tablet by mouth every eight hours for pain. Do not crush.     OxyCODONE 10 mg T12a 12 hr tablet  Commonly known as:  OXYCONTIN  Take one tablet by mouth every 8 hours for pain. Do not crush     oxyCODONE-acetaminophen 5-325 MG per tablet  Commonly known as:  PERCOCET/ROXICET  Take 2 tablets by mouth every 6 (six) hours as needed. For pain     promethazine 25 MG tablet  Commonly known as:  PHENERGAN  Take 25 mg by mouth every 6 (six) hours as needed. For nausea     QUEtiapine 50 MG tablet  Commonly known as:  SEROQUEL  Take 100 mg by mouth 2 (two) times daily.     QUEtiapine 50 MG tablet  Commonly known as:  SEROQUEL  Take 50 mg by mouth daily as needed. For delusions/ agitation     simvastatin 20 MG tablet  Commonly known as:  ZOCOR  Take 20 mg by mouth at bedtime.     ZIOPTAN 0.0015 % Soln  Generic drug:  Tafluprost  Place 1 drop into both eyes at bedtime.     ZOLMitriptan 2.5 MG tablet  Commonly known as:  ZOMIG  Take 2.5 mg by mouth as needed. For migraine     zolpidem 5 MG tablet  Commonly known as:  AMBIEN  Take 1 tablet (5 mg total) by mouth at bedtime as needed. For sleep            Allergies  Allergen Reactions  . Lyrica [Pregabalin]   . Morphine And Related   . Penicillins   . Sulfa Antibiotics     DATA REVIEWED  Laboratory Studies: WBC 4.4, RBC 4.4, Hemoglobin 11.8, Hematocrit 35.2, BUN 7, Creatinine 0.59, Albumin 3.8  Past Medical History  Diagnosis Date  . Dementia   . CVA (cerebral vascular accident)   .  COPD (chronic obstructive pulmonary disease)     Emphysema  . GERD (gastroesophageal reflux disease)   . Glaucoma   . Hyperlipidemia   . Insomnia   . Rhinitis 12/14/1999  . Depression 10/12/2009  . Cancer 07/18/2008    Breast cancer-Left  . Hypertension   . Arthritis 07/18/2008    osteoporosis  . Peripheral neuropathy 07/18/2008  . Cerebrovascular disease 08/05/2003    acute  . Lumbago 08/05/2003  . Edema 08/05/2003  . Cellulitis and abscess of leg 08/05/2003  . Peripheral vascular disease 08/05/2003  . Anxiety 08/05/2003  . Hypopotassemia 08/05/2003  . Anemia         Past Surgical History  Procedure Laterality Date  . Carotid endarterectomy  2011    Left CEA  . Breast surgery    . Eye surgery  07/18/2008    cataract  . Fracture surgery  07/18/2008    closed  part neck femur     History   Social History  . Marital Status: Widowed    Spouse Name: N/A    Number of Children: N/A  . Years of Education: N/A   Occupational History  . Not on file.   Social History Main Topics  . Smoking status: Former Smoker    Types: Cigarettes    Quit date: 03/16/1999  . Smokeless tobacco: Not on file  . Alcohol Use: No  . Drug Use: No  . Sexual Activity: Not on file   Other Topics Concern  . Not on file   Social History Narrative  . No narrative on file   Review of Systems  Constitutional: Negative.   HENT: Negative.   Eyes: Negative.   Respiratory: Negative.   Cardiovascular: Negative.   Gastrointestinal: Negative.   Genitourinary: Negative.   Musculoskeletal: Positive for back pain.       History of Chronic Back Pain  Skin: Negative.   Neurological: Positive for dizziness.  Endo/Heme/Allergies: Negative.   Psychiatric/Behavioral: Negative.      Physical Exam Filed Vitals:   12/12/12 1601  BP: 142/64  Pulse: 72  Temp: 97.3 F (36.3 C)  Resp: 20   There is no weight on file to calculate BMI.  Physical Exam  Constitutional: She is oriented to person, place, and  time. She appears well-developed and well-nourished.  HENT:  Head: Normocephalic.  Mouth/Throat: Oropharynx is clear and moist.  Neck: Normal range of motion. Neck supple.  Cardiovascular: Normal rate, regular rhythm and normal heart sounds.   Pulmonary/Chest: Effort normal and breath sounds normal.  Abdominal: Soft. Bowel sounds are normal.  Musculoskeletal:       Right hip: She exhibits decreased range of motion and decreased strength.       Left hip: She exhibits decreased range of motion and decreased strength.       Lumbar back: She exhibits tenderness and pain. She exhibits no bony tenderness, no swelling and no edema.  Lymphadenopathy:    She has no cervical adenopathy.  Neurological: She is alert and oriented to person, place, and time. GCS eye subscore is 4. GCS verbal subscore is 5. GCS motor subscore is 6.  Skin: Skin is warm and dry.    ASSESSMENT/PLAN #1.Marland KitchenAcute on Chronic Back Pain-Continue Lidoderm patch and prn po pain medication #2. Intermittent Confusion-  Obtain UA/C&S to r/o UTI, continue to monitor and ensure safety precautions #3. Depression-Continue current treatment plan in conjunction with Psych NP #4.Anxiety-Continue current treatment plan in conjunction with Psych NP-pt is stable #5. Hypertension- Pt currently stable on treatment plan will continue with current medications #6 GERD-Pt is stable on current treatment plan will continue with current treatment #7 Glaucoma-Pt is stable on current treatment plan will continue with current treatment

## 2012-12-14 ENCOUNTER — Other Ambulatory Visit: Payer: Self-pay | Admitting: *Deleted

## 2012-12-14 MED ORDER — OXYCODONE HCL ER 10 MG PO T12A: EXTENDED_RELEASE_TABLET | ORAL | Status: DC

## 2013-01-15 ENCOUNTER — Other Ambulatory Visit: Payer: Self-pay | Admitting: *Deleted

## 2013-01-15 MED ORDER — OXYCODONE HCL ER 10 MG PO T12A: EXTENDED_RELEASE_TABLET | ORAL | Status: DC

## 2013-02-15 ENCOUNTER — Other Ambulatory Visit: Payer: Self-pay | Admitting: *Deleted

## 2013-02-15 MED ORDER — OXYCODONE HCL 5 MG PO TABA: ORAL_TABLET | ORAL | Status: DC

## 2013-02-19 ENCOUNTER — Other Ambulatory Visit: Payer: Self-pay | Admitting: *Deleted

## 2013-02-19 MED ORDER — OXYCODONE HCL ER 10 MG PO T12A: EXTENDED_RELEASE_TABLET | ORAL | Status: DC

## 2013-02-21 NOTE — Progress Notes (Signed)
Patient ID: Dorothy Collins, female   DOB: 1930/10/31, 79 y.o.   MRN: [id number]     [facility]   Allergies  Allergen Reactions  . Lyrica [Pregabalin]   . Morphine And Related   . Penicillins   . Sulfa Antibiotics     Chief Complaint  Patient presents with  . Medical Managment of Chronic Issues    HPI She is being seen for the management of her chronic illnesses. There are no concerns being voiced by the nursing staff at this time. She is not voicing any concerns except an  Occasional cough. There has been no recent change in her overall status   Past Medical History  Diagnosis Date  . Dementia   . CVA (cerebral vascular accident)   . COPD (chronic obstructive pulmonary disease)     Emphysema  . GERD (gastroesophageal reflux disease)   . Glaucoma   . Hyperlipidemia   . Insomnia   . Rhinitis 12/14/1999  . Depression 10/12/2009  . Cancer 07/18/2008    Breast cancer-Left  . Hypertension   . Arthritis 07/18/2008    osteoporosis  . Peripheral neuropathy 07/18/2008  . Cerebrovascular disease 08/05/2003    acute  . Lumbago 08/05/2003  . Edema 08/05/2003  . Cellulitis and abscess of leg 08/05/2003  . Peripheral vascular disease 08/05/2003  . Anxiety 08/05/2003  . Hypopotassemia 08/05/2003  . Anemia     Past Surgical History  Procedure Laterality Date  . Carotid endarterectomy  2011    Left CEA  . Breast surgery    . Eye surgery  07/18/2008    cataract  . Fracture surgery  07/18/2008    closed  part neck femur    Filed Vitals:   10/02/12 1058  BP: 132/70  Pulse: 74  Height: 5\' 2"  (1.575 m)  Weight: 149 lb (67.586 kg)    MEDICATIONS Cozaar 100 mg daily cymbalta 90 mg daily lidoderm patch to lower back daily norvasc 10 mg daily  astelastine spray daily Senna s daily lumigan both eyes daily namenda xr 14 mg daily aggrenox twice daily oscal twice daily Vit d 400 units twice daily lovaza 2 gm twice daily simbrinza both eyes twice daily zaditor both eyes twice  daily pirlosec 40 mg twice daily klonopine 0.5 mg three times daily nerontin 800 mg tid Oxycontin 10 mg every 8 hours zanaflex 2 mg every 6 hours zocor 10 mg daily ambien 5 mg nightly    LABS REVIEWED: NONE RECENT   Review of Systems  Constitutional: Positive for malaise/fatigue.  Eyes: Negative for blurred vision.  Respiratory: Positive for cough. Negative for sputum production, shortness of breath and wheezing.   Cardiovascular: Negative for chest pain and palpitations.  Gastrointestinal: Negative for heartburn, abdominal pain and constipation.  Musculoskeletal: Positive for back pain and myalgias.       Is managed   Skin: Negative.   Neurological: Negative for dizziness and headaches.  Psychiatric/Behavioral: Negative for depression. The patient is not nervous/anxious and does not have insomnia.     Physical Exam  Constitutional: She is oriented to person, place, and time. She appears well-developed and well-nourished. No distress.  Eyes: Pupils are equal, round, and reactive to light.  Neck: Neck supple. No JVD present. No thyromegaly present.  Cardiovascular: Normal rate, regular rhythm and intact distal pulses.   Respiratory: Effort normal and breath sounds normal. No respiratory distress. She has no wheezes.  GI: Soft. Bowel sounds are normal. She exhibits no distension. There is no  tenderness.  Musculoskeletal: Normal range of motion. She exhibits no edema.  Is able to move all extremities; has generalized weakness  Lymphadenopathy:    She has no cervical adenopathy.  Neurological: She is alert and oriented to person, place, and time.  Skin: Skin is warm and dry. She is not diaphoretic. No erythema.  Psychiatric: She has a normal mood and affect.     ASSESSMENT/PLAN  1. Hypertension: is stable will continue cozaar 100 mg daily; norvasc 10 mg daily and will monitor  2. Cva: is neurologically stable; will continue aggrenox twice daily and will monitor her status    3. Vascular dementia: no change in her overall status: will continue namenda xr 14 mg daily  4. Dyslipidemia: will continue zocor 10 mg daily and lovaza 2 gm twice daily   5. Insomnia: will continue ambien 5 mg nightly  6. Glaucoma; will continue lumigan both eyes daily; simbrinza both eyes twice daily and zaditor both eyes twice daily   7. Gerd: will continue prilosec 40 mg twice daily   8. Constipation; will continue senna s daily   9. Chronic pain: which is more in her lower back; is presently managed with the current regimen of: cymbalta 90 mg daily; lidoderm to lower back daily; oxycontin 10 mg every 8 hours;  And zanaflex 2 mg every 6 hours for spasticity will continue to monitor her status  10. Peripheral neuropathy: is stable will continue her pain management of cymbalta 90 mg daily; neurontin 800 mg tid and will monitor   11. Allergic rhinits: will continue astastine spray daily  12. Depression is stable will continue cymbalta 90 mg daily and klonopin 0.5 mg tid for anxiety.   Will check cbc; cmp; lipids next draw

## 2013-03-06 ENCOUNTER — Other Ambulatory Visit: Payer: Self-pay | Admitting: *Deleted

## 2013-03-06 MED ORDER — CLONAZEPAM 0.5 MG PO TABS: ORAL_TABLET | ORAL | Status: DC

## 2013-03-19 ENCOUNTER — Non-Acute Institutional Stay (SKILLED_NURSING_FACILITY): Payer: Medicare Other | Admitting: Family

## 2013-03-19 ENCOUNTER — Encounter: Payer: Self-pay | Admitting: Family

## 2013-03-19 DIAGNOSIS — E785 Hyperlipidemia, unspecified: Secondary | ICD-10-CM

## 2013-03-19 DIAGNOSIS — F0391 Unspecified dementia with behavioral disturbance: Secondary | ICD-10-CM

## 2013-03-19 DIAGNOSIS — M545 Low back pain, unspecified: Secondary | ICD-10-CM

## 2013-03-19 DIAGNOSIS — G4700 Insomnia, unspecified: Secondary | ICD-10-CM

## 2013-03-19 DIAGNOSIS — F03918 Unspecified dementia, unspecified severity, with other behavioral disturbance: Secondary | ICD-10-CM

## 2013-03-19 DIAGNOSIS — I10 Essential (primary) hypertension: Secondary | ICD-10-CM

## 2013-03-19 NOTE — Progress Notes (Signed)
Patient ID: Dorothy Collins, female   DOB: Nov 26, 1930, 80 y.o.   MRN: [id number]   Date: 03/19/13 Facility: [facility]    Chief Complaint  Patient presents with  . Medical Managment of Chronic Issues    Routine Visit    HPI: Pt is being followed for the medical management of chronic illnesses. Health care team denies concerns/issues at present.      Allergies  Allergen Reactions  . Lyrica [Pregabalin]   . Morphine And Related   . Penicillins   . Sulfa Antibiotics      Medication List       This list is accurate as of: 03/19/13 11:59 PM.  Always use your most recent med list.               amLODipine 10 MG tablet  Commonly known as:  NORVASC  Take 10 mg by mouth daily.     azelastine 137 MCG/SPRAY nasal spray  Commonly known as:  ASTELIN  Place 1 spray into both nostrils 2 (two) times daily. Use in each nostril as directed     brimonidine 0.1 % Soln  Commonly known as:  ALPHAGAN P  Place 1 drop into both eyes 2 (two) times daily.     calcium-vitamin D 500-200 MG-UNIT per tablet  Commonly known as:  OSCAL WITH D  Take 1 tablet by mouth 2 (two) times daily.     cholecalciferol 400 UNITS Tabs tablet  Commonly known as:  VITAMIN D  Take 400 Units by mouth 2 (two) times daily.     clonazePAM 0.5 MG tablet  Commonly known as:  KLONOPIN  Take one tablet by mouth three times daily for anxiety     cloNIDine 0.1 MG tablet  Commonly known as:  CATAPRES  Take 0.1 mg by mouth 2 (two) times daily as needed. For SBP> 180 & DBP > 95     cycloSPORINE 0.05 % ophthalmic emulsion  Commonly known as:  RESTASIS  Place 1 drop into both eyes 2 (two) times daily.     diphenhydrAMINE 25 MG tablet  Commonly known as:  BENADRYL  Take 25 mg by mouth every 6 (six) hours as needed. allergies     dipyridamole-aspirin 200-25 MG per 12 hr capsule  Commonly known as:  AGGRENOX  Take 1 capsule by mouth 2 (two) times daily.     DULoxetine 60 MG capsule  Commonly known as:  CYMBALTA   Take 90 mg by mouth daily. Takes with 30mg  for total of 90mg      DULoxetine 30 MG capsule  Commonly known as:  CYMBALTA  Take 30 mg by mouth daily. Takes with 60 mg for total of 90mg      fexofenadine 180 MG tablet  Commonly known as:  ALLEGRA  Take 180 mg by mouth daily.     gabapentin 300 MG capsule  Commonly known as:  NEURONTIN  Take 600 mg by mouth 3 (three) times daily.     lidocaine 5 %  Commonly known as:  LIDODERM  Place 1 patch onto the skin daily. Apply to back  -  Remove & Discard patch within 12 hours or as directed by MD     losartan 100 MG tablet  Commonly known as:  COZAAR  Take 100 mg by mouth daily.     lubiprostone 8 MCG capsule  Commonly known as:  AMITIZA  Take 8 mcg by mouth 2 (two) times daily with a meal. Fors constipation  meclizine 25 MG tablet  Commonly known as:  ANTIVERT  Take 25 mg by mouth every 6 (six) hours as needed.     memantine 10 MG tablet  Commonly known as:  NAMENDA  Take 10 mg by mouth 2 (two) times daily.     metaxalone 800 MG tablet  Commonly known as:  SKELAXIN  Take 400 mg by mouth every 6 (six) hours as needed. For muscle spasms     multivitamin with minerals Tabs tablet  Take 1 tablet by mouth daily.     olopatadine 0.1 % ophthalmic solution  Commonly known as:  PATANOL  Place 1 drop into both eyes 2 (two) times daily.     omega-3 acid ethyl esters 1 G capsule  Commonly known as:  LOVAZA  Take 2 g by mouth 2 (two) times daily.     omeprazole 20 MG capsule  Commonly known as:  PRILOSEC  Take 20 mg by mouth daily.     oxyCODONE 10 MG 12 hr tablet  Commonly known as:  OXYCONTIN  Take one tablet by mouth every eight hours for pain. Do not crush.     OxyCODONE HCl (Abuse Deter) 5 MG Taba  Take one tablet by mouth every 4 hours as needed for pain     OxyCODONE 10 mg T12a 12 hr tablet  Commonly known as:  OXYCONTIN  Take one tablet by mouth every 8 hours for pain. Do not crush     oxyCODONE-acetaminophen 5-325  MG per tablet  Commonly known as:  PERCOCET/ROXICET  Take 2 tablets by mouth every 6 (six) hours as needed. For pain     promethazine 25 MG tablet  Commonly known as:  PHENERGAN  Take 25 mg by mouth every 6 (six) hours as needed. For nausea     QUEtiapine 50 MG tablet  Commonly known as:  SEROQUEL  Take 100 mg by mouth 2 (two) times daily.     QUEtiapine 50 MG tablet  Commonly known as:  SEROQUEL  Take 50 mg by mouth daily as needed. For delusions/ agitation     simvastatin 20 MG tablet  Commonly known as:  ZOCOR  Take 20 mg by mouth at bedtime.     ZIOPTAN 0.0015 % Soln  Generic drug:  Tafluprost  Place 1 drop into both eyes at bedtime.     ZOLMitriptan 2.5 MG tablet  Commonly known as:  ZOMIG  Take 2.5 mg by mouth as needed. For migraine     zolpidem 5 MG tablet  Commonly known as:  AMBIEN  Take 1 tablet (5 mg total) by mouth at bedtime as needed. For sleep         DATA REVIEWED   Laboratory Studies: Reviewed     Past Medical History  Diagnosis Date  . Dementia   . CVA (cerebral vascular accident)   . COPD (chronic obstructive pulmonary disease)     Emphysema  . GERD (gastroesophageal reflux disease)   . Glaucoma   . Hyperlipidemia   . Insomnia   . Rhinitis 12/14/1999  . Depression 10/12/2009  . Cancer 07/18/2008    Breast cancer-Left  . Hypertension   . Arthritis 07/18/2008    osteoporosis  . Peripheral neuropathy 07/18/2008  . Cerebrovascular disease 08/05/2003    acute  . Lumbago 08/05/2003  . Edema 08/05/2003  . Cellulitis and abscess of leg 08/05/2003  . Peripheral vascular disease 08/05/2003  . Anxiety 08/05/2003  . Hypopotassemia 08/05/2003  . Anemia  Review of Systems  Constitutional: Negative.   HENT: Negative.   Respiratory: Negative.   Cardiovascular: Negative.   Genitourinary: Negative.   Musculoskeletal: Positive for back pain and joint pain.  Neurological: Negative.   Psychiatric/Behavioral: Negative.      Physical Exam Filed  Vitals:   03/19/13 2106  BP: 120/60  Pulse: 84  Temp: 97.7 F (36.5 C)  Resp: 18   There is no weight on file to calculate BMI. Physical Exam  Cardiovascular: Normal rate, regular rhythm and normal heart sounds.   Pulmonary/Chest: Effort normal and breath sounds normal.  Neurological: She is alert. GCS eye subscore is 4. GCS verbal subscore is 4. GCS motor subscore is 6.    ASSESSMENT/PLAN  Obtain CBC w/ diff, BMP, Vitamin D and B12 level and Folate  Hypertension-will continue current treatment plan; pt is stable Dementia-will continue current treatment plan; pt is stable Lumbago-will continue medication regimen Insomnia-will continue current medication regimen Arthritis-will continue current treatment plan; pt is stable   Follow up:prn

## 2013-03-21 ENCOUNTER — Other Ambulatory Visit: Payer: Self-pay | Admitting: *Deleted

## 2013-03-21 MED ORDER — OXYCODONE HCL ER 10 MG PO T12A: EXTENDED_RELEASE_TABLET | ORAL | Status: DC

## 2013-03-26 ENCOUNTER — Encounter: Payer: Self-pay | Admitting: Family

## 2013-03-26 ENCOUNTER — Non-Acute Institutional Stay (SKILLED_NURSING_FACILITY): Payer: Medicare Other | Admitting: Family

## 2013-03-26 DIAGNOSIS — F0391 Unspecified dementia with behavioral disturbance: Secondary | ICD-10-CM

## 2013-03-26 DIAGNOSIS — E559 Vitamin D deficiency, unspecified: Secondary | ICD-10-CM

## 2013-03-26 DIAGNOSIS — F03918 Unspecified dementia, unspecified severity, with other behavioral disturbance: Secondary | ICD-10-CM

## 2013-03-26 DIAGNOSIS — I10 Essential (primary) hypertension: Secondary | ICD-10-CM

## 2013-03-26 DIAGNOSIS — M545 Low back pain, unspecified: Secondary | ICD-10-CM

## 2013-03-26 NOTE — Progress Notes (Signed)
Patient ID: Dorothy Collins, female   DOB: 1930/11/03, 80 y.o.   MRN: [id number]  Date: 03/26/12 Facility: [facility]  Code Status:  DNR  Chief Complaint  Patient presents with  . Acute Visit    Abnormal labs    HPI: F/u for abnormal labs ordered during routine visit. Pt endorses feeling well with the exception of chronic back pain. No further issues/concerns expressed at present time.       Allergies  Allergen Reactions  . Lyrica [Pregabalin]   . Morphine And Related   . Penicillins   . Sulfa Antibiotics      Medication List       This list is accurate as of: 03/26/13  2:42 PM.  Always use your most recent med list.               amLODipine 10 MG tablet  Commonly known as:  NORVASC  Take 10 mg by mouth daily.     azelastine 137 MCG/SPRAY nasal spray  Commonly known as:  ASTELIN  Place 1 spray into both nostrils 2 (two) times daily. Use in each nostril as directed     brimonidine 0.1 % Soln  Commonly known as:  ALPHAGAN P  Place 1 drop into both eyes 2 (two) times daily.     calcium-vitamin D 500-200 MG-UNIT per tablet  Commonly known as:  OSCAL WITH D  Take 1 tablet by mouth 2 (two) times daily.     cholecalciferol 400 UNITS Tabs tablet  Commonly known as:  VITAMIN D  Take 400 Units by mouth 2 (two) times daily.     clonazePAM 0.5 MG tablet  Commonly known as:  KLONOPIN  Take one tablet by mouth three times daily for anxiety     cloNIDine 0.1 MG tablet  Commonly known as:  CATAPRES  Take 0.1 mg by mouth 2 (two) times daily as needed. For SBP> 180 & DBP > 95     cycloSPORINE 0.05 % ophthalmic emulsion  Commonly known as:  RESTASIS  Place 1 drop into both eyes 2 (two) times daily.     diphenhydrAMINE 25 MG tablet  Commonly known as:  BENADRYL  Take 25 mg by mouth every 6 (six) hours as needed. allergies     dipyridamole-aspirin 200-25 MG per 12 hr capsule  Commonly known as:  AGGRENOX  Take 1 capsule by mouth 2 (two) times daily.     DULoxetine 60 MG capsule  Commonly known as:  CYMBALTA  Take 90 mg by mouth daily. Takes with 30mg  for total of 90mg      DULoxetine 30 MG capsule  Commonly known as:  CYMBALTA  Take 30 mg by mouth daily. Takes with 60 mg for total of 90mg      fexofenadine 180 MG tablet  Commonly known as:  ALLEGRA  Take 180 mg by mouth daily.     gabapentin 300 MG capsule  Commonly known as:  NEURONTIN  Take 600 mg by mouth 3 (three) times daily.     lidocaine 5 %  Commonly known as:  LIDODERM  Place 1 patch onto the skin daily. Apply to back  -  Remove & Discard patch within 12 hours or as directed by MD     losartan 100 MG tablet  Commonly known as:  COZAAR  Take 100 mg by mouth daily.     lubiprostone 8 MCG capsule  Commonly known as:  AMITIZA  Take 8 mcg by mouth 2 (two)  times daily with a meal. Fors constipation     meclizine 25 MG tablet  Commonly known as:  ANTIVERT  Take 25 mg by mouth every 6 (six) hours as needed.     memantine 10 MG tablet  Commonly known as:  NAMENDA  Take 10 mg by mouth 2 (two) times daily.     metaxalone 800 MG tablet  Commonly known as:  SKELAXIN  Take 400 mg by mouth every 6 (six) hours as needed. For muscle spasms     multivitamin with minerals Tabs tablet  Take 1 tablet by mouth daily.     olopatadine 0.1 % ophthalmic solution  Commonly known as:  PATANOL  Place 1 drop into both eyes 2 (two) times daily.     omega-3 acid ethyl esters 1 G capsule  Commonly known as:  LOVAZA  Take 2 g by mouth 2 (two) times daily.     omeprazole 20 MG capsule  Commonly known as:  PRILOSEC  Take 20 mg by mouth daily.     oxyCODONE 10 MG 12 hr tablet  Commonly known as:  OXYCONTIN  Take one tablet by mouth every eight hours for pain. Do not crush.     OxyCODONE HCl (Abuse Deter) 5 MG Taba  Take one tablet by mouth every 4 hours as needed for pain     OxyCODONE 10 mg T12a 12 hr tablet  Commonly known as:  OXYCONTIN  Take one tablet by mouth every 8 hours  for pain. Do not crush     oxyCODONE-acetaminophen 5-325 MG per tablet  Commonly known as:  PERCOCET/ROXICET  Take 2 tablets by mouth every 6 (six) hours as needed. For pain     promethazine 25 MG tablet  Commonly known as:  PHENERGAN  Take 25 mg by mouth every 6 (six) hours as needed. For nausea     QUEtiapine 50 MG tablet  Commonly known as:  SEROQUEL  Take 100 mg by mouth 2 (two) times daily.     QUEtiapine 50 MG tablet  Commonly known as:  SEROQUEL  Take 50 mg by mouth daily as needed. For delusions/ agitation     simvastatin 20 MG tablet  Commonly known as:  ZOCOR  Take 20 mg by mouth at bedtime.     ZIOPTAN 0.0015 % Soln  Generic drug:  Tafluprost  Place 1 drop into both eyes at bedtime.     ZOLMitriptan 2.5 MG tablet  Commonly known as:  ZOMIG  Take 2.5 mg by mouth as needed. For migraine     zolpidem 5 MG tablet  Commonly known as:  AMBIEN  Take 1 tablet (5 mg total) by mouth at bedtime as needed. For sleep         DATA REVIEWED   Laboratory Studies: 03/20/13- WBC 3.6, Hemoglobin 12.3, Hematocrit 37.5, Platelets 249, BUN 7, Creatinine 0.72, Na 143, K 4.5, Cl 102, Ca 8.9, Vitamin B12 842, Flate >19.9, Vitamin D 26.3     Past Medical History  Diagnosis Date  . Dementia   . CVA (cerebral vascular accident)   . COPD (chronic obstructive pulmonary disease)     Emphysema  . GERD (gastroesophageal reflux disease)   . Glaucoma   . Hyperlipidemia   . Insomnia   . Rhinitis 12/14/1999  . Depression 10/12/2009  . Cancer 07/18/2008    Breast cancer-Left  . Hypertension   . Arthritis 07/18/2008    osteoporosis  . Peripheral neuropathy 07/18/2008  . Cerebrovascular disease 08/05/2003  acute  . Lumbago 08/05/2003  . Edema 08/05/2003  . Cellulitis and abscess of leg 08/05/2003  . Peripheral vascular disease 08/05/2003  . Anxiety 08/05/2003  . Hypopotassemia 08/05/2003  . Anemia      Past Surgical History  Procedure Laterality Date  . Carotid endarterectomy  2011     Left CEA  . Breast surgery    . Eye surgery  07/18/2008    cataract  . Fracture surgery  07/18/2008    closed  part neck femur       Review of Systems  Respiratory: Negative.   Cardiovascular: Negative.   Gastrointestinal: Negative.   Genitourinary: Negative.   Musculoskeletal: Positive for back pain.  Neurological: Negative.   Psychiatric/Behavioral: Negative.      Physical Exam Filed Vitals:   03/26/13 1438  BP: 118/76  Pulse: 68  Temp: 97.9 F (36.6 C)  Resp: 18   There is no weight on file to calculate BMI. Physical Exam  Constitutional: She is oriented to person, place, and time.  Cardiovascular: Normal rate and regular rhythm.   Pulmonary/Chest: Effort normal and breath sounds normal.  Neurological: She is alert and oriented to person, place, and time.    ASSESSMENT/PLAN Vitamin D3 50,000IU/ month Vitamin D level in one month Education provided regarding Vitamin D Deficiency Encourage pt to obtain indirect sunlight 15 minutes/day by opening blinds in room   Follow up:prn  Spent 50 minutes

## 2013-04-02 ENCOUNTER — Encounter: Payer: Self-pay | Admitting: *Deleted

## 2013-04-02 ENCOUNTER — Non-Acute Institutional Stay (SKILLED_NURSING_FACILITY): Payer: Medicare Other | Admitting: Family

## 2013-04-02 ENCOUNTER — Encounter: Payer: Self-pay | Admitting: Family

## 2013-04-02 DIAGNOSIS — F3289 Other specified depressive episodes: Secondary | ICD-10-CM

## 2013-04-02 DIAGNOSIS — K117 Disturbances of salivary secretion: Secondary | ICD-10-CM

## 2013-04-02 DIAGNOSIS — R682 Dry mouth, unspecified: Secondary | ICD-10-CM

## 2013-04-02 DIAGNOSIS — F329 Major depressive disorder, single episode, unspecified: Secondary | ICD-10-CM

## 2013-04-02 DIAGNOSIS — F32A Depression, unspecified: Secondary | ICD-10-CM

## 2013-04-02 NOTE — Progress Notes (Signed)
Patient ID: Dorothy Collins, female   DOB: 27-Dec-1930, 80 y.o.   MRN: [id number]  Date: 04/02/13 [facility]  Code Status:  DNR Chief Complaint  Patient presents with  . Acute Visit    Dry mouth    HPI: Pt reports dry mouth x 2 weeks.  Pt denies blurred vision, SOB, N/V/D, constipation, fever, chills. Pt further reports intermittent feelings of sadness; denies SI and HI.  She refuses to see psych services at this time. No further issues/concerns expressed by pt or health care team at present time.       Allergies  Allergen Reactions  . Lyrica [Pregabalin]   . Morphine And Related   . Penicillins   . Sulfa Antibiotics      Medication List       This list is accurate as of: 04/02/13  8:19 PM.  Always use your most recent med list.               amLODipine 10 MG tablet  Commonly known as:  NORVASC  Take 10 mg by mouth daily.     azelastine 137 MCG/SPRAY nasal spray  Commonly known as:  ASTELIN  Place 1 spray into both nostrils 2 (two) times daily. Use in each nostril as directed     brimonidine 0.1 % Soln  Commonly known as:  ALPHAGAN P  Place 1 drop into both eyes 2 (two) times daily.     calcium-vitamin D 500-200 MG-UNIT per tablet  Commonly known as:  OSCAL WITH D  Take 1 tablet by mouth 2 (two) times daily.     Vitamin D3 50000 UNITS Caps  Take 50,000 Units by mouth once a week. Take 1 tablet every week for 8 weeks.     cholecalciferol 400 UNITS Tabs tablet  Commonly known as:  VITAMIN D  Take 400 Units by mouth 2 (two) times daily.     clonazePAM 0.5 MG tablet  Commonly known as:  KLONOPIN  Take one tablet by mouth three times daily for anxiety     cloNIDine 0.1 MG tablet  Commonly known as:  CATAPRES  Take 0.1 mg by mouth 2 (two) times daily as needed. For SBP> 180 & DBP > 95     cycloSPORINE 0.05 % ophthalmic emulsion  Commonly known as:  RESTASIS  Place 1 drop into both eyes 2 (two) times daily.     diphenhydrAMINE 25 MG tablet    Commonly known as:  BENADRYL  Take 25 mg by mouth every 6 (six) hours as needed. allergies     dipyridamole-aspirin 200-25 MG per 12 hr capsule  Commonly known as:  AGGRENOX  Take 1 capsule by mouth 2 (two) times daily.     DULoxetine 60 MG capsule  Commonly known as:  CYMBALTA  Take 90 mg by mouth daily. Takes with 30mg  for total of 90mg      DULoxetine 30 MG capsule  Commonly known as:  CYMBALTA  Take 30 mg by mouth daily. Takes with 60 mg for total of 90mg      fexofenadine 180 MG tablet  Commonly known as:  ALLEGRA  Take 180 mg by mouth daily.     gabapentin 300 MG capsule  Commonly known as:  NEURONTIN  Take 600 mg by mouth 3 (three) times daily.     lidocaine 5 %  Commonly known as:  LIDODERM  Place 1 patch onto the skin daily. Apply to back  -  Remove & Discard  patch within 12 hours or as directed by MD     losartan 100 MG tablet  Commonly known as:  COZAAR  Take 100 mg by mouth daily.     lubiprostone 8 MCG capsule  Commonly known as:  AMITIZA  Take 8 mcg by mouth 2 (two) times daily with a meal. Fors constipation     meclizine 25 MG tablet  Commonly known as:  ANTIVERT  Take 25 mg by mouth every 6 (six) hours as needed.     memantine 10 MG tablet  Commonly known as:  NAMENDA  Take 10 mg by mouth 2 (two) times daily.     metaxalone 800 MG tablet  Commonly known as:  SKELAXIN  Take 400 mg by mouth every 6 (six) hours as needed. For muscle spasms     multivitamin with minerals Tabs tablet  Take 1 tablet by mouth daily.     olopatadine 0.1 % ophthalmic solution  Commonly known as:  PATANOL  Place 1 drop into both eyes 2 (two) times daily.     omega-3 acid ethyl esters 1 G capsule  Commonly known as:  LOVAZA  Take 2 g by mouth 2 (two) times daily.     omeprazole 20 MG capsule  Commonly known as:  PRILOSEC  Take 20 mg by mouth daily.     OxyCODONE HCl (Abuse Deter) 5 MG Taba  Take one tablet by mouth every 4 hours as needed for pain     OxyCODONE 10  mg T12a 12 hr tablet  Commonly known as:  OXYCONTIN  Take one tablet by mouth every 8 hours for pain. Do not crush     promethazine 25 MG tablet  Commonly known as:  PHENERGAN  Take 25 mg by mouth every 6 (six) hours as needed. For nausea     QUEtiapine 50 MG tablet  Commonly known as:  SEROQUEL  Take 100 mg by mouth 2 (two) times daily.     simvastatin 20 MG tablet  Commonly known as:  ZOCOR  Take 20 mg by mouth at bedtime.     ZIOPTAN 0.0015 % Soln  Generic drug:  Tafluprost  Place 1 drop into both eyes at bedtime.     ZOLMitriptan 2.5 MG tablet  Commonly known as:  ZOMIG  Take 2.5 mg by mouth as needed. For migraine     zolpidem 5 MG tablet  Commonly known as:  AMBIEN  Take 1 tablet (5 mg total) by mouth at bedtime as needed. For sleep         DATA REVIEWED    Laboratory Studies: Reviewed     Past Medical History  Diagnosis Date  . Dementia   . CVA (cerebral vascular accident)   . COPD (chronic obstructive pulmonary disease)     Emphysema  . GERD (gastroesophageal reflux disease)   . Glaucoma   . Hyperlipidemia   . Insomnia   . Rhinitis 12/14/1999  . Depression 10/12/2009  . Cancer 07/18/2008    Breast cancer-Left  . Hypertension   . Arthritis 07/18/2008    osteoporosis  . Peripheral neuropathy 07/18/2008  . Cerebrovascular disease 08/05/2003    acute  . Lumbago 08/05/2003  . Edema 08/05/2003  . Cellulitis and abscess of leg 08/05/2003  . Peripheral vascular disease 08/05/2003  . Anxiety 08/05/2003  . Hypopotassemia 08/05/2003  . Anemia      Past Surgical History  Procedure Laterality Date  . Carotid endarterectomy  2011    Left CEA  .  Eye surgery  07/18/2008    cataract  . Fracture surgery  07/18/2008    closed  part neck femur  . Joint replacement      rt knee  . Breast surgery      rt    Review of Systems  Constitutional: Negative.   HENT:       Dry Mouth  Eyes: Negative.   Respiratory: Positive for cough.        Non-productive    Cardiovascular: Negative.   Gastrointestinal: Negative.   Genitourinary: Negative.   Musculoskeletal: Positive for back pain and joint pain.  Skin: Negative.   Neurological: Negative.   Endo/Heme/Allergies: Negative.   Psychiatric/Behavioral: Positive for depression and memory loss. Negative for suicidal ideas. The patient is not nervous/anxious.    Physical Exam Filed Vitals:   04/02/13 1622  BP: 142/68  Pulse: 68  Temp: 97.9 F (36.6 C)  Resp: 18   There is no weight on file to calculate BMI. Physical Exam  Constitutional: She is oriented to person, place, and time.  HENT:  Dry mouth/ pink mucous membranes  Cardiovascular: Normal rate and regular rhythm.   Pulmonary/Chest: Effort normal and breath sounds normal.  Neurological: She is alert and oriented to person, place, and time.  Psychiatric: She exhibits a depressed mood.    ASSESSMENT/PLAN  Dry Mouth-Biotene mouthwash bid Depression- will have psych services follow up w/ pt.   Follow up:prn

## 2013-04-23 ENCOUNTER — Encounter: Payer: Self-pay | Admitting: *Deleted

## 2013-04-23 ENCOUNTER — Encounter: Payer: Self-pay | Admitting: Family

## 2013-04-23 ENCOUNTER — Non-Acute Institutional Stay (SKILLED_NURSING_FACILITY): Payer: Medicare Other | Admitting: Family

## 2013-04-23 DIAGNOSIS — I10 Essential (primary) hypertension: Secondary | ICD-10-CM

## 2013-04-23 DIAGNOSIS — M545 Low back pain, unspecified: Secondary | ICD-10-CM

## 2013-04-23 DIAGNOSIS — E559 Vitamin D deficiency, unspecified: Secondary | ICD-10-CM

## 2013-04-23 DIAGNOSIS — F411 Generalized anxiety disorder: Secondary | ICD-10-CM

## 2013-04-23 DIAGNOSIS — F0391 Unspecified dementia with behavioral disturbance: Secondary | ICD-10-CM

## 2013-04-23 DIAGNOSIS — F32A Depression, unspecified: Secondary | ICD-10-CM

## 2013-04-23 DIAGNOSIS — G609 Hereditary and idiopathic neuropathy, unspecified: Secondary | ICD-10-CM

## 2013-04-23 DIAGNOSIS — G629 Polyneuropathy, unspecified: Secondary | ICD-10-CM

## 2013-04-23 DIAGNOSIS — F03918 Unspecified dementia, unspecified severity, with other behavioral disturbance: Secondary | ICD-10-CM

## 2013-04-23 DIAGNOSIS — F3289 Other specified depressive episodes: Secondary | ICD-10-CM

## 2013-04-23 DIAGNOSIS — F329 Major depressive disorder, single episode, unspecified: Secondary | ICD-10-CM

## 2013-04-23 NOTE — Progress Notes (Signed)
Patient ID: Dorothy Collins, female   DOB: 17-Jul-1930, 80 y.o.   MRN: [id number]  Date: 04/23/13 [facility]  Code Status: DNR  Chief Complaint  Patient presents with  . Medical Managment of Chronic Issues    Routine Visit    HPI: Pt is being followed for the medical management of chronic illnesses. Health care team denies concerns/issues at present.     Allergies  Allergen Reactions  . Lyrica [Pregabalin]   . Morphine And Related   . Penicillins   . Sulfa Antibiotics      Medication List       This list is accurate as of: 04/23/13 11:59 PM.  Always use your most recent med list.               amLODipine 10 MG tablet  Commonly known as:  NORVASC  Take 10 mg by mouth daily.     antiseptic oral rinse Liqd  15 mLs by Mouth Rinse route 2 (two) times daily.     azelastine 137 MCG/SPRAY nasal spray  Commonly known as:  ASTELIN  Place 1 spray into both nostrils 2 (two) times daily. Use in each nostril as directed for uncontrolled rhinorrhea.     brimonidine 0.1 % Soln  Commonly known as:  ALPHAGAN P  Place 1 drop into both eyes 2 (two) times daily.     calcium-vitamin D 500-200 MG-UNIT per tablet  Commonly known as:  OSCAL WITH D  Take 1 tablet by mouth 2 (two) times daily.     Vitamin D3 50000 UNITS Caps  Take 50,000 Units by mouth once a week. Take 1 tablet every week for 8 weeks.     cholecalciferol 400 UNITS Tabs tablet  Commonly known as:  VITAMIN D  Take 400 Units by mouth 2 (two) times daily.     clonazePAM 0.5 MG tablet  Commonly known as:  KLONOPIN  Take one tablet by mouth three times daily for anxiety     cloNIDine 0.1 MG tablet  Commonly known as:  CATAPRES  Take 0.1 mg by mouth 2 (two) times daily as needed. For SBP> 180 & DBP > 95     cycloSPORINE 0.05 % ophthalmic emulsion  Commonly known as:  RESTASIS  Place 1 drop into both eyes 2 (two) times daily.     diphenhydrAMINE 25 MG tablet  Commonly known as:  BENADRYL  Take 25 mg by  mouth every 6 (six) hours as needed. allergies     dipyridamole-aspirin 200-25 MG per 12 hr capsule  Commonly known as:  AGGRENOX  Take 1 capsule by mouth 2 (two) times daily.     DULoxetine 60 MG capsule  Commonly known as:  CYMBALTA  Take 90 mg by mouth daily. Takes with 30mg  for total of 90mg      DULoxetine 30 MG capsule  Commonly known as:  CYMBALTA  Take 30 mg by mouth daily. Takes with 60 mg for total of 90mg      fexofenadine 180 MG tablet  Commonly known as:  ALLEGRA  Take 180 mg by mouth daily.     gabapentin 300 MG capsule  Commonly known as:  NEURONTIN  Take 600 mg by mouth 3 (three) times daily.     lidocaine 5 %  Commonly known as:  LIDODERM  Place 1 patch onto the skin daily. Apply to back  -  Remove & Discard patch within 12 hours or as directed by MD  losartan 100 MG tablet  Commonly known as:  COZAAR  Take 100 mg by mouth daily.     lubiprostone 8 MCG capsule  Commonly known as:  AMITIZA  Take 8 mcg by mouth 2 (two) times daily with a meal. Fors constipation     meclizine 25 MG tablet  Commonly known as:  ANTIVERT  Take 25 mg by mouth every 6 (six) hours as needed.     memantine 10 MG tablet  Commonly known as:  NAMENDA  Take 10 mg by mouth 2 (two) times daily.     metaxalone 800 MG tablet  Commonly known as:  SKELAXIN  Take 400 mg by mouth every 6 (six) hours as needed. For muscle spasms     multivitamin with minerals Tabs tablet  Take 1 tablet by mouth daily.     olopatadine 0.1 % ophthalmic solution  Commonly known as:  PATANOL  Place 1 drop into both eyes 2 (two) times daily.     omega-3 acid ethyl esters 1 G capsule  Commonly known as:  LOVAZA  Take 2 g by mouth 2 (two) times daily.     omeprazole 20 MG capsule  Commonly known as:  PRILOSEC  Take 40 mg by mouth 2 (two) times daily before a meal.     OxyCODONE HCl (Abuse Deter) 5 MG Taba  Take one tablet by mouth every 4 hours as needed for pain     OxyCODONE 10 mg T12a 12 hr  tablet  Commonly known as:  OXYCONTIN  Take one tablet by mouth every 8 hours for pain. Do not crush     OxyCODONE 10 mg T12a 12 hr tablet  Commonly known as:  OXYCONTIN  Take one tablet by mouth every 8 hours for pain. Do not crush     promethazine 25 MG tablet  Commonly known as:  PHENERGAN  Take 25 mg by mouth every 6 (six) hours as needed. For nausea     QUEtiapine 50 MG tablet  Commonly known as:  SEROQUEL  Take 200 mg by mouth at bedtime. 100mg  qam For depressive psychosis.     sennosides-docusate sodium 8.6-50 MG tablet  Commonly known as:  SENOKOT-S  Take 1 tablet by mouth daily.     simvastatin 20 MG tablet  Commonly known as:  ZOCOR  Take 20 mg by mouth at bedtime.     ZIOPTAN 0.0015 % Soln  Generic drug:  Tafluprost  Place 1 drop into both eyes at bedtime.     ZOLMitriptan 2.5 MG tablet  Commonly known as:  ZOMIG  Take 2.5 mg by mouth as needed. For migraine     zolpidem 5 MG tablet  Commonly known as:  AMBIEN  Take 1 tablet (5 mg total) by mouth at bedtime as needed. For sleep         DATA REVIEWED  Laboratory Studies:Reviewed     Past Medical History  Diagnosis Date  . Dementia   . CVA (cerebral vascular accident)   . COPD (chronic obstructive pulmonary disease)     Emphysema  . GERD (gastroesophageal reflux disease)   . Glaucoma   . Hyperlipidemia   . Insomnia   . Rhinitis 12/14/1999  . Depression 10/12/2009  . Cancer 07/18/2008    Breast cancer-Left  . Hypertension   . Arthritis 07/18/2008    osteoporosis  . Peripheral neuropathy 07/18/2008  . Cerebrovascular disease 08/05/2003    acute  . Lumbago 08/05/2003  . Edema 08/05/2003  .  Cellulitis and abscess of leg 08/05/2003  . Peripheral vascular disease 08/05/2003  . Anxiety 08/05/2003  . Hypopotassemia 08/05/2003  . Anemia      Past Surgical History  Procedure Laterality Date  . Carotid endarterectomy  2011    Left CEA  . Eye surgery  07/18/2008    cataract  . Fracture surgery  07/18/2008     closed  part neck femur  . Joint replacement      rt knee  . Breast surgery      rt     Review of Systems  Constitutional: Negative.   HENT: Negative.   Eyes: Positive for blurred vision.       Corrective Lenses  Respiratory: Negative.   Cardiovascular: Negative.   Gastrointestinal: Negative.   Genitourinary: Negative.   Musculoskeletal: Positive for back pain.  Skin: Negative.   Neurological: Negative.   Endo/Heme/Allergies: Negative.   Psychiatric/Behavioral: Positive for depression. Negative for suicidal ideas. The patient is nervous/anxious.     Physical Exam Filed Vitals:   04/23/13 1544  BP: 150/80  Pulse: 70  Temp: 97.3 F (36.3 C)  Resp: 18   There is no weight on file to calculate BMI. Physical Exam  Nursing note reviewed. Constitutional: She is oriented to person, place, and time.  HENT:  Head: Normocephalic.  Eyes: EOM are normal. Pupils are equal, round, and reactive to light.  Neck: Normal range of motion. No tracheal deviation present. No thyromegaly present.  Cardiovascular: Normal rate and regular rhythm.   Pulmonary/Chest: Effort normal and breath sounds normal.  Abdominal: Soft. Bowel sounds are normal.  Musculoskeletal:  DME-Wheelchair  Neurological: She is alert and oriented to person, place, and time.  Skin: Skin is warm and dry.  Psychiatric: She has a normal mood and affect. Her behavior is normal. Judgment and thought content normal.    ASSESSMENT/PLAN  HTN- will continue current dosage of Cozaar, Clonidine and Norvasc; pt is currently stable Depression-will continue Cymbalta; pt stable on current treatment Lumbago-will continue Lidoderm patch and oxycodone Rhinorrhea-will continue Astelin; pt stable on treatment plan Unspecified Constipation-will continue Senna Vitamin D Deficiency-will continue Vitamin D3 50,000 IU for 8 week completion CAD-will continue ASA for prophylaxis Neuralgia-will continue Neurontin; pt is currently stable  on treatment plan GERD-will continue Antacid; pt is stable on treatment regimen Anxiety-will continue Klonopin; pt is stable on treatment regimen  Follow up:prn

## 2013-05-22 ENCOUNTER — Other Ambulatory Visit: Payer: Self-pay | Admitting: *Deleted

## 2013-05-22 MED ORDER — OXYCODONE HCL ER 10 MG PO T12A: EXTENDED_RELEASE_TABLET | ORAL | Status: DC

## 2013-05-22 NOTE — Telephone Encounter (Signed)
[facility] 

## 2013-06-04 ENCOUNTER — Encounter: Payer: Self-pay | Admitting: Family

## 2013-06-06 ENCOUNTER — Other Ambulatory Visit: Payer: Self-pay | Admitting: *Deleted

## 2013-06-06 MED ORDER — ZOLPIDEM TARTRATE 5 MG PO TABS: ORAL_TABLET | ORAL | Status: DC

## 2013-06-21 ENCOUNTER — Other Ambulatory Visit: Payer: Self-pay | Admitting: *Deleted

## 2013-06-21 MED ORDER — OXYCODONE HCL ER 10 MG PO T12A: EXTENDED_RELEASE_TABLET | ORAL | Status: DC

## 2013-06-21 NOTE — Telephone Encounter (Signed)
[facility] 

## 2013-07-24 ENCOUNTER — Non-Acute Institutional Stay: Payer: Medicare Other | Admitting: Internal Medicine

## 2013-07-24 ENCOUNTER — Other Ambulatory Visit: Payer: Self-pay | Admitting: *Deleted

## 2013-07-24 DIAGNOSIS — I10 Essential (primary) hypertension: Secondary | ICD-10-CM

## 2013-07-24 DIAGNOSIS — I872 Venous insufficiency (chronic) (peripheral): Secondary | ICD-10-CM

## 2013-07-24 DIAGNOSIS — R609 Edema, unspecified: Secondary | ICD-10-CM

## 2013-07-24 MED ORDER — OXYCODONE HCL ER 10 MG PO T12A: EXTENDED_RELEASE_TABLET | ORAL | Status: DC

## 2013-07-24 NOTE — Telephone Encounter (Signed)
[facility] 

## 2013-07-25 NOTE — Progress Notes (Addendum)
Patient ID: Dorothy Collins, female   DOB: [birth year], 80 y.o.   MRN: [id number]                  PROGRESS NOTE  DATE:  07/24/2013    FACILITY: [facility]    LEVEL OF CARE:   Assisted Living     Acute Visit   CHIEF COMPLAINT:  Lower extremity edema.    HISTORY OF PRESENT ILLNESS:  Dorothy Collins complained to the facility staff, including the administrator, about lower extremity swelling.  I have reviewed her chart and examined her with regards to this problem.    It seems that she has had lower extremity edema for quite some time.  She does have a history of diastolic heart failure grade 1 via an echo in 2011.  She also has a history of PAD, hypertension, and is a type 2 diabetic.    She has not had any recent lab work, although in January of this year her BUN and creatinine were within the normal range.  Liver function tests and albumin were normal as of September 2014.    As mentioned above, I think some of this is chronic.    CURRENT MEDICATIONS:  Medication list is reviewed.     Cozaar 100 q.d.    Cymbalta 90 daily.      Lidoderm topically to back.     Norvasc 10 q.d.    Astelin spray 0.1%, 2 sprays in nostrils.    Senokot 1 tablet every morning.    Lumigan ophthalmic 0.01%.    Aggrenox 25/200, 1 tablet twice daily.    Os-Cal 500 plus D twice daily.    Vitamin D 400 U twice daily.    Lovaza 2 g twice daily.    Prilosec 40 mg twice daily.    Namenda 5 mg twice a day.    Risperdal 1 mg twice daily.    Neurontin 800 three times a day.    Clonazepam 0.5 three times a day.    Zanaflex 2 mg every 6 hours.    OxyContin 10 mg every 8 hours.    Zocor 20 q.d.    REVIEW OF SYSTEMS:   CHEST/RESPIRATORY:  She is not complaining of shortness of breath.   CARDIAC:   No chest pain.   GI:  No nausea, vomiting or diarrhea is described by the patient.   CIRCULATION:  Extremities:  States her edema has been getting a lot worse, especially on the right leg.     PHYSICAL EXAMINATION:   VITAL SIGNS:   PULSE:  76 and regular.   RESPIRATIONS:  20 and unlabored.   GENERAL APPEARANCE:  The patient is not in any distress.   CHEST/RESPIRATORY:  Shallow, but otherwise clear air entry bilaterally.   CARDIOVASCULAR:  CARDIAC:  Heart sounds are distant.  However, there are no murmurs.  No increase in jugular venous pressure.  She has no coccyx edema.    GASTROINTESTINAL:  LIVER/SPLEEN/KIDNEYS:  No liver, no spleen.  No tenderness.    CIRCULATION:   EDEMA/VARICOSITIES:  Extremities:  She does have chronic 3+ edema below the knees.  I think there is a component of PAD.  There is no evidence of a DVT.  I think she probably does have chronic superficial phlebitis  SKIN:  INSPECTION:  She has tinea pedis.  I think this is an incidental finding.    ASSESSMENT/PLAN:  Lower extremity edema.  I think this is mostly chronic venous insufficiency.  Norvasc is probably not the right choice for treatment of her hypertension.  I will substitute for this.    Hypertension.  I will probably give her an additional agent combination arb/HCTZ  Psychiatric status.  I think she is felt to have dementia with underlying depression and psychosis.  I am not certain how closely Psychiatry is following her medications and whether any adjustments are indicated.    Advanced glaucoma.  Followed by Ophthalmology.    History of CVAs with an MRI in 2011 showing scattered punctate acute infarctions.  CT/angio of the neck at that time showed 90% proximal left internal carotid stenosis, 74% on the right side.  This seems stable.

## 2013-08-16 ENCOUNTER — Encounter (HOSPITAL_COMMUNITY): Payer: Self-pay | Admitting: Emergency Medicine

## 2013-08-16 ENCOUNTER — Inpatient Hospital Stay (HOSPITAL_COMMUNITY)
Admission: EM | Admit: 2013-08-16 | Discharge: 2013-08-20 | DRG: 871 | Disposition: A | Discharge status: Skilled Nursing Facility | Payer: Medicare Other | Attending: Internal Medicine | Admitting: Internal Medicine

## 2013-08-16 ENCOUNTER — Emergency Department (HOSPITAL_COMMUNITY): Payer: Medicare Other

## 2013-08-16 DIAGNOSIS — T17908A Unspecified foreign body in respiratory tract, part unspecified causing other injury, initial encounter: Secondary | ICD-10-CM

## 2013-08-16 DIAGNOSIS — R0902 Hypoxemia: Secondary | ICD-10-CM | POA: Diagnosis present

## 2013-08-16 DIAGNOSIS — Z87891 Personal history of nicotine dependence: Secondary | ICD-10-CM

## 2013-08-16 DIAGNOSIS — A419 Sepsis, unspecified organism: Principal | ICD-10-CM | POA: Diagnosis present

## 2013-08-16 DIAGNOSIS — N179 Acute kidney failure, unspecified: Secondary | ICD-10-CM

## 2013-08-16 DIAGNOSIS — Z96659 Presence of unspecified artificial knee joint: Secondary | ICD-10-CM

## 2013-08-16 DIAGNOSIS — R4182 Altered mental status, unspecified: Secondary | ICD-10-CM

## 2013-08-16 DIAGNOSIS — Z7401 Bed confinement status: Secondary | ICD-10-CM

## 2013-08-16 DIAGNOSIS — F329 Major depressive disorder, single episode, unspecified: Secondary | ICD-10-CM | POA: Diagnosis present

## 2013-08-16 DIAGNOSIS — M545 Low back pain, unspecified: Secondary | ICD-10-CM | POA: Diagnosis present

## 2013-08-16 DIAGNOSIS — F0150 Vascular dementia without behavioral disturbance: Secondary | ICD-10-CM

## 2013-08-16 DIAGNOSIS — R6520 Severe sepsis without septic shock: Secondary | ICD-10-CM

## 2013-08-16 DIAGNOSIS — J4489 Other specified chronic obstructive pulmonary disease: Secondary | ICD-10-CM | POA: Diagnosis present

## 2013-08-16 DIAGNOSIS — M549 Dorsalgia, unspecified: Secondary | ICD-10-CM

## 2013-08-16 DIAGNOSIS — I959 Hypotension, unspecified: Secondary | ICD-10-CM | POA: Diagnosis present

## 2013-08-16 DIAGNOSIS — I150 Renovascular hypertension: Secondary | ICD-10-CM

## 2013-08-16 DIAGNOSIS — G629 Polyneuropathy, unspecified: Secondary | ICD-10-CM

## 2013-08-16 DIAGNOSIS — I672 Cerebral atherosclerosis: Secondary | ICD-10-CM | POA: Diagnosis present

## 2013-08-16 DIAGNOSIS — K219 Gastro-esophageal reflux disease without esophagitis: Secondary | ICD-10-CM

## 2013-08-16 DIAGNOSIS — G609 Hereditary and idiopathic neuropathy, unspecified: Secondary | ICD-10-CM | POA: Diagnosis present

## 2013-08-16 DIAGNOSIS — E785 Hyperlipidemia, unspecified: Secondary | ICD-10-CM

## 2013-08-16 DIAGNOSIS — G4700 Insomnia, unspecified: Secondary | ICD-10-CM

## 2013-08-16 DIAGNOSIS — J690 Pneumonitis due to inhalation of food and vomit: Secondary | ICD-10-CM | POA: Diagnosis present

## 2013-08-16 DIAGNOSIS — M810 Age-related osteoporosis without current pathological fracture: Secondary | ICD-10-CM | POA: Diagnosis present

## 2013-08-16 DIAGNOSIS — D649 Anemia, unspecified: Secondary | ICD-10-CM | POA: Diagnosis present

## 2013-08-16 DIAGNOSIS — F3289 Other specified depressive episodes: Secondary | ICD-10-CM | POA: Diagnosis present

## 2013-08-16 DIAGNOSIS — H409 Unspecified glaucoma: Secondary | ICD-10-CM

## 2013-08-16 DIAGNOSIS — I10 Essential (primary) hypertension: Secondary | ICD-10-CM | POA: Diagnosis present

## 2013-08-16 DIAGNOSIS — F32A Depression, unspecified: Secondary | ICD-10-CM

## 2013-08-16 DIAGNOSIS — Z66 Do not resuscitate: Secondary | ICD-10-CM | POA: Diagnosis present

## 2013-08-16 DIAGNOSIS — Z79899 Other long term (current) drug therapy: Secondary | ICD-10-CM

## 2013-08-16 DIAGNOSIS — K5900 Constipation, unspecified: Secondary | ICD-10-CM

## 2013-08-16 DIAGNOSIS — Z85828 Personal history of other malignant neoplasm of skin: Secondary | ICD-10-CM

## 2013-08-16 DIAGNOSIS — I6529 Occlusion and stenosis of unspecified carotid artery: Secondary | ICD-10-CM

## 2013-08-16 DIAGNOSIS — I739 Peripheral vascular disease, unspecified: Secondary | ICD-10-CM | POA: Diagnosis present

## 2013-08-16 DIAGNOSIS — G8929 Other chronic pain: Secondary | ICD-10-CM

## 2013-08-16 DIAGNOSIS — J449 Chronic obstructive pulmonary disease, unspecified: Secondary | ICD-10-CM | POA: Diagnosis present

## 2013-08-16 DIAGNOSIS — J309 Allergic rhinitis, unspecified: Secondary | ICD-10-CM

## 2013-08-16 DIAGNOSIS — Z8673 Personal history of transient ischemic attack (TIA), and cerebral infarction without residual deficits: Secondary | ICD-10-CM

## 2013-08-16 DIAGNOSIS — I6990 Unspecified sequelae of unspecified cerebrovascular disease: Secondary | ICD-10-CM

## 2013-08-16 DIAGNOSIS — F411 Generalized anxiety disorder: Secondary | ICD-10-CM | POA: Diagnosis present

## 2013-08-16 DIAGNOSIS — R509 Fever, unspecified: Secondary | ICD-10-CM

## 2013-08-16 LAB — CBC WITH DIFFERENTIAL/PLATELET
BASOS ABS: 0 10*3/uL (ref 0.0–0.1)
Basophils Relative: 0 % (ref 0–1)
EOS PCT: 2 % (ref 0–5)
Eosinophils Absolute: 0.2 10*3/uL (ref 0.0–0.7)
HEMATOCRIT: 37.3 % (ref 36.0–46.0)
HEMOGLOBIN: 12.9 g/dL (ref 12.0–15.0)
LYMPHS PCT: 19 % (ref 12–46)
Lymphs Abs: 1.8 10*3/uL (ref 0.7–4.0)
MCH: 31.2 pg (ref 26.0–34.0)
MCHC: 34.6 g/dL (ref 30.0–36.0)
MCV: 90.1 fL (ref 78.0–100.0)
MONO ABS: 0.8 10*3/uL (ref 0.1–1.0)
MONOS PCT: 8 % (ref 3–12)
Neutro Abs: 6.8 10*3/uL (ref 1.7–7.7)
Neutrophils Relative %: 71 % (ref 43–77)
Platelets: 220 10*3/uL (ref 150–400)
RBC: 4.14 MIL/uL (ref 3.87–5.11)
RDW: 13.4 % (ref 11.5–15.5)
WBC: 9.6 10*3/uL (ref 4.0–10.5)

## 2013-08-16 LAB — I-STAT TROPONIN, ED: Troponin i, poc: 0.05 ng/mL (ref 0.00–0.08)

## 2013-08-16 LAB — I-STAT CHEM 8, ED
BUN: 50 mg/dL — AB (ref 6–23)
Calcium, Ion: 1.15 mmol/L (ref 1.13–1.30)
Chloride: 91 mEq/L — ABNORMAL LOW (ref 96–112)
Creatinine, Ser: 2.6 mg/dL — ABNORMAL HIGH (ref 0.50–1.10)
GLUCOSE: 125 mg/dL — AB (ref 70–99)
HCT: 41 % (ref 36.0–46.0)
HEMOGLOBIN: 13.9 g/dL (ref 12.0–15.0)
POTASSIUM: 3.6 meq/L — AB (ref 3.7–5.3)
Sodium: 138 mEq/L (ref 137–147)
TCO2: 29 mmol/L (ref 0–100)

## 2013-08-16 LAB — URINALYSIS, ROUTINE W REFLEX MICROSCOPIC
Glucose, UA: NEGATIVE mg/dL
Hgb urine dipstick: NEGATIVE
Ketones, ur: NEGATIVE mg/dL
NITRITE: NEGATIVE
Protein, ur: NEGATIVE mg/dL
SPECIFIC GRAVITY, URINE: 1.023 (ref 1.005–1.030)
Urobilinogen, UA: 0.2 mg/dL (ref 0.0–1.0)
pH: 5 (ref 5.0–8.0)

## 2013-08-16 LAB — COMPREHENSIVE METABOLIC PANEL
ALT: 15 U/L (ref 0–35)
AST: 62 U/L — AB (ref 0–37)
Albumin: 2.8 g/dL — ABNORMAL LOW (ref 3.5–5.2)
Alkaline Phosphatase: 66 U/L (ref 39–117)
BILIRUBIN TOTAL: 0.5 mg/dL (ref 0.3–1.2)
BUN: 55 mg/dL — AB (ref 6–23)
CALCIUM: 9.3 mg/dL (ref 8.4–10.5)
CHLORIDE: 94 meq/L — AB (ref 96–112)
CO2: 29 meq/L (ref 19–32)
CREATININE: 2.34 mg/dL — AB (ref 0.50–1.10)
GFR calc non Af Amer: 19 mL/min — ABNORMAL LOW (ref >90)
GFR, EST AFRICAN AMERICAN: 21 mL/min — AB (ref >90)
GLUCOSE: 124 mg/dL — AB (ref 70–99)
Potassium: 3.9 mEq/L (ref 3.7–5.3)
Sodium: 137 mEq/L (ref 137–147)
Total Protein: 7.1 g/dL (ref 6.0–8.3)

## 2013-08-16 LAB — MRSA PCR SCREENING: MRSA BY PCR: NEGATIVE

## 2013-08-16 LAB — URINE MICROSCOPIC-ADD ON

## 2013-08-16 LAB — I-STAT CG4 LACTIC ACID, ED: Lactic Acid, Venous: 1.28 mmol/L (ref 0.5–2.2)

## 2013-08-16 MED ORDER — POLYVINYL ALCOHOL 1.4 % OP SOLN
1.0000 [drp] | Freq: Two times a day (BID) | OPHTHALMIC | Status: DC
  Administered 2013-08-16 – 2013-08-20 (×8): 1 [drp] via OPHTHALMIC
  Filled 2013-08-16: qty 15

## 2013-08-16 MED ORDER — ONDANSETRON HCL 4 MG/2ML IJ SOLN
4.0000 mg | Freq: Four times a day (QID) | INTRAMUSCULAR | Status: DC | PRN
  Administered 2013-08-20: 4 mg via INTRAVENOUS
  Filled 2013-08-16: qty 2

## 2013-08-16 MED ORDER — SODIUM CHLORIDE 0.9 % IV BOLUS (SEPSIS)
1000.0000 mL | Freq: Once | INTRAVENOUS | Status: AC
  Administered 2013-08-16: 1000 mL via INTRAVENOUS

## 2013-08-16 MED ORDER — SODIUM CHLORIDE 0.9 % IV SOLN
Freq: Once | INTRAVENOUS | Status: AC
  Administered 2013-08-16: 1000 mL/h via INTRAVENOUS

## 2013-08-16 MED ORDER — SODIUM CHLORIDE 0.9 % IV SOLN
INTRAVENOUS | Status: DC
  Administered 2013-08-16: 100 mL/h via INTRAVENOUS
  Administered 2013-08-16: 19:00:00 via INTRAVENOUS
  Administered 2013-08-17: 1000 mL via INTRAVENOUS

## 2013-08-16 MED ORDER — VANCOMYCIN HCL 10 G IV SOLR
1250.0000 mg | Freq: Once | INTRAVENOUS | Status: AC
  Administered 2013-08-16: 1250 mg via INTRAVENOUS
  Filled 2013-08-16: qty 1250

## 2013-08-16 MED ORDER — AZELASTINE HCL 0.1 % NA SOLN
1.0000 | Freq: Two times a day (BID) | NASAL | Status: DC
  Administered 2013-08-17 – 2013-08-20 (×6): 1 via NASAL
  Filled 2013-08-16: qty 30

## 2013-08-16 MED ORDER — DEXTROSE 5 % IV SOLN
1.0000 g | Freq: Once | INTRAVENOUS | Status: AC
  Administered 2013-08-16: 1 g via INTRAVENOUS
  Filled 2013-08-16: qty 1

## 2013-08-16 MED ORDER — SODIUM CHLORIDE 0.9 % IV SOLN
INTRAVENOUS | Status: AC
  Administered 2013-08-16: 19:00:00 via INTRAVENOUS

## 2013-08-16 MED ORDER — OXYCODONE HCL 5 MG PO TABS
5.0000 mg | ORAL_TABLET | ORAL | Status: DC | PRN
  Administered 2013-08-16 – 2013-08-20 (×9): 5 mg via ORAL
  Filled 2013-08-16 (×9): qty 1

## 2013-08-16 MED ORDER — SENNOSIDES-DOCUSATE SODIUM 8.6-50 MG PO TABS
1.0000 | ORAL_TABLET | Freq: Every day | ORAL | Status: DC
  Administered 2013-08-16 – 2013-08-18 (×3): 1 via ORAL
  Filled 2013-08-16 (×4): qty 1

## 2013-08-16 MED ORDER — ACETAMINOPHEN 500 MG PO TABS
1000.0000 mg | ORAL_TABLET | Freq: Once | ORAL | Status: AC
  Administered 2013-08-16: 1000 mg via ORAL
  Filled 2013-08-16: qty 2

## 2013-08-16 MED ORDER — ENOXAPARIN SODIUM 30 MG/0.3ML ~~LOC~~ SOLN
30.0000 mg | SUBCUTANEOUS | Status: DC
  Administered 2013-08-16: 30 mg via SUBCUTANEOUS
  Filled 2013-08-16 (×2): qty 0.3

## 2013-08-16 MED ORDER — ACETAMINOPHEN 650 MG RE SUPP
650.0000 mg | RECTAL | Status: DC | PRN
  Filled 2013-08-16: qty 1

## 2013-08-16 MED ORDER — ALBUTEROL SULFATE (2.5 MG/3ML) 0.083% IN NEBU
2.5000 mg | INHALATION_SOLUTION | RESPIRATORY_TRACT | Status: DC | PRN
  Administered 2013-08-18: 2.5 mg via RESPIRATORY_TRACT
  Filled 2013-08-16: qty 3

## 2013-08-16 MED ORDER — ZOLPIDEM TARTRATE 5 MG PO TABS
5.0000 mg | ORAL_TABLET | Freq: Every evening | ORAL | Status: DC | PRN
  Administered 2013-08-18: 5 mg via ORAL
  Filled 2013-08-16: qty 1

## 2013-08-16 MED ORDER — ACETAMINOPHEN 325 MG PO TABS: 650.0000 mg | ORAL_TABLET | Freq: Once | ORAL | Status: DC

## 2013-08-16 MED ORDER — SODIUM CHLORIDE 0.9 % IV SOLN: INTRAVENOUS | Status: DC

## 2013-08-16 MED ORDER — ONDANSETRON HCL 4 MG PO TABS: 4.0000 mg | ORAL_TABLET | Freq: Four times a day (QID) | ORAL | Status: DC | PRN

## 2013-08-16 MED ORDER — POLYETHYL GLYCOL-PROPYL GLYCOL 0.4-0.3 % OP SOLN: Freq: Two times a day (BID) | OPHTHALMIC | Status: DC

## 2013-08-16 MED ORDER — DULOXETINE HCL 30 MG PO CPEP
30.0000 mg | ORAL_CAPSULE | Freq: Every day | ORAL | Status: DC
  Administered 2013-08-16 – 2013-08-20 (×5): 30 mg via ORAL
  Filled 2013-08-16 (×5): qty 1

## 2013-08-16 MED ORDER — LOTEPREDNOL ETABONATE 0.5 % OP SUSP
1.0000 [drp] | Freq: Four times a day (QID) | OPHTHALMIC | Status: DC
  Administered 2013-08-16 – 2013-08-20 (×14): 1 [drp] via OPHTHALMIC
  Filled 2013-08-16: qty 5

## 2013-08-16 MED ORDER — GABAPENTIN 800 MG PO TABS
400.0000 mg | ORAL_TABLET | Freq: Three times a day (TID) | ORAL | Status: DC
  Filled 2013-08-16 (×4): qty 0.5

## 2013-08-16 MED ORDER — LATANOPROST 0.005 % OP SOLN
1.0000 [drp] | Freq: Every day | OPHTHALMIC | Status: DC
  Administered 2013-08-16 – 2013-08-19 (×3): 1 [drp] via OPHTHALMIC
  Filled 2013-08-16 (×2): qty 2.5

## 2013-08-16 MED ORDER — CYCLOSPORINE 0.05 % OP EMUL
1.0000 [drp] | Freq: Two times a day (BID) | OPHTHALMIC | Status: DC
  Administered 2013-08-16 – 2013-08-18 (×5): 1 [drp] via OPHTHALMIC
  Administered 2013-08-19: via OPHTHALMIC
  Administered 2013-08-19 – 2013-08-20 (×2): 1 [drp] via OPHTHALMIC
  Filled 2013-08-16 (×10): qty 1

## 2013-08-16 MED ORDER — PANTOPRAZOLE SODIUM 40 MG PO TBEC
40.0000 mg | DELAYED_RELEASE_TABLET | Freq: Every day | ORAL | Status: DC
  Administered 2013-08-17 – 2013-08-20 (×4): 40 mg via ORAL
  Filled 2013-08-16 (×4): qty 1

## 2013-08-16 MED ORDER — MEMANTINE HCL 5 MG PO TABS
5.0000 mg | ORAL_TABLET | Freq: Two times a day (BID) | ORAL | Status: DC
  Administered 2013-08-16 – 2013-08-20 (×7): 5 mg via ORAL
  Filled 2013-08-16 (×9): qty 1

## 2013-08-16 MED ORDER — DEXTROSE 5 % IV SOLN
1.0000 g | INTRAVENOUS | Status: DC
  Administered 2013-08-17: 1 g via INTRAVENOUS
  Filled 2013-08-16 (×2): qty 1

## 2013-08-16 MED ORDER — VANCOMYCIN HCL IN DEXTROSE 750-5 MG/150ML-% IV SOLN
750.0000 mg | INTRAVENOUS | Status: DC
  Administered 2013-08-17: 750 mg via INTRAVENOUS
  Filled 2013-08-16 (×2): qty 150

## 2013-08-16 MED ORDER — GABAPENTIN 400 MG PO CAPS
400.0000 mg | ORAL_CAPSULE | Freq: Three times a day (TID) | ORAL | Status: DC
  Administered 2013-08-16 – 2013-08-20 (×11): 400 mg via ORAL
  Filled 2013-08-16 (×13): qty 1

## 2013-08-16 MED ORDER — RISPERIDONE 1 MG PO TBDP
1.0000 mg | ORAL_TABLET | Freq: Two times a day (BID) | ORAL | Status: DC
  Administered 2013-08-17 – 2013-08-20 (×7): 1 mg via ORAL
  Filled 2013-08-16 (×9): qty 1

## 2013-08-16 MED ORDER — ASPIRIN-DIPYRIDAMOLE ER 25-200 MG PO CP12
1.0000 | ORAL_CAPSULE | Freq: Two times a day (BID) | ORAL | Status: DC
  Administered 2013-08-16 – 2013-08-20 (×8): 1 via ORAL
  Filled 2013-08-16 (×9): qty 1

## 2013-08-16 MED ORDER — ACETAMINOPHEN 325 MG PO TABS: 650.0000 mg | ORAL_TABLET | Freq: Four times a day (QID) | ORAL | Status: DC | PRN

## 2013-08-16 MED ORDER — CLONAZEPAM 0.5 MG PO TABS
0.5000 mg | ORAL_TABLET | Freq: Three times a day (TID) | ORAL | Status: DC | PRN
  Administered 2013-08-18 – 2013-08-19 (×2): 0.5 mg via ORAL
  Filled 2013-08-16 (×2): qty 1

## 2013-08-16 MED ORDER — SENNA-DOCUSATE SODIUM 8.6-50 MG PO TABS: 1.0000 | ORAL_TABLET | Freq: Every day | ORAL | Status: DC

## 2013-08-16 NOTE — ED Notes (Signed)
Initially unresponsive, sating in 80%, but 90% on non-rebreather. Normally up and about-lives at [facility]. BP 00/F systolic.

## 2013-08-16 NOTE — ED Provider Notes (Addendum)
TIME SEEN: 1:00 PM  CHIEF COMPLAINT: Altered mental status, hypoxia, hypotension, fever  HPI: Patient is a 80 year old female coming from a skilled nursing facility who has a history of dementia, CVA, COPD, history of breast cancer, hypertension who presents to the nursing facility unresponsive with fever, hypoxia and hypotension. History is provided by EMS as patient is unresponsive. Per EMS, patient was found hypoxic in her bed with food in her mouth and around her lips. Once we removed her dentures and remove this due to her oxygen saturation improved but was still in the 80s.  She is not on O2 chronically. Blood pressure is 83/50. Per paperwork, patient is a DO NOT RESUSCITATE.  ROS: Unobtainable due to altered mental status  PAST MEDICAL HISTORY/PAST SURGICAL HISTORY:  Past Medical History  Diagnosis Date  . Dementia   . CVA (cerebral vascular accident)   . COPD (chronic obstructive pulmonary disease)     Emphysema  . GERD (gastroesophageal reflux disease)   . Glaucoma   . Hyperlipidemia   . Insomnia   . Rhinitis 12/14/1999  . Depression 10/12/2009  . Cancer 07/18/2008    Breast cancer-Left  . Hypertension   . Arthritis 07/18/2008    osteoporosis  . Peripheral neuropathy 07/18/2008  . Cerebrovascular disease 08/05/2003    acute  . Lumbago 08/05/2003  . Edema 08/05/2003  . Cellulitis and abscess of leg 08/05/2003  . Peripheral vascular disease 08/05/2003  . Anxiety 08/05/2003  . Hypopotassemia 08/05/2003  . Anemia     MEDICATIONS:  Prior to Admission medications   Medication Sig Start Date End Date Taking? Authorizing Provider  amLODipine (NORVASC) 10 MG tablet Take 10 mg by mouth daily.    Historical Provider, MD  antiseptic oral rinse (BIOTENE) LIQD 15 mLs by Mouth Rinse route 2 (two) times daily.    Historical Provider, MD  azelastine (ASTELIN) 137 MCG/SPRAY nasal spray Place 1 spray into both nostrils 2 (two) times daily. Use in each nostril as directed for uncontrolled  rhinorrhea.    Historical Provider, MD  brimonidine (ALPHAGAN P) 0.1 % SOLN Place 1 drop into both eyes 2 (two) times daily.    Historical Provider, MD  calcium-vitamin D (OSCAL WITH D) 500-200 MG-UNIT per tablet Take 1 tablet by mouth 2 (two) times daily.     Historical Provider, MD  cholecalciferol (VITAMIN D) 400 UNITS TABS Take 400 Units by mouth 2 (two) times daily.    Historical Provider, MD  clonazePAM (KLONOPIN) 0.5 MG tablet Take one tablet by mouth three times daily for anxiety 03/06/13   Blanchie Serve, MD  cloNIDine (CATAPRES) 0.1 MG tablet Take 0.1 mg by mouth 2 (two) times daily as needed. For SBP> 180 & DBP > 95    Historical Provider, MD  cycloSPORINE (RESTASIS) 0.05 % ophthalmic emulsion Place 1 drop into both eyes 2 (two) times daily.    Historical Provider, MD  diphenhydrAMINE (BENADRYL) 25 MG tablet Take 25 mg by mouth every 6 (six) hours as needed. allergies    Historical Provider, MD  dipyridamole-aspirin (AGGRENOX) 25-200 MG per 12 hr capsule Take 1 capsule by mouth 2 (two) times daily.    Historical Provider, MD  DULoxetine (CYMBALTA) 30 MG capsule Take 30 mg by mouth daily. Takes with 60 mg for total of 90mg     Historical Provider, MD  DULoxetine (CYMBALTA) 60 MG capsule Take 90 mg by mouth daily. Takes with 30mg  for total of 90mg     Historical Provider, MD  fexofenadine (ALLEGRA) 180 MG  tablet Take 180 mg by mouth daily.    Historical Provider, MD  gabapentin (NEURONTIN) 300 MG capsule Take 600 mg by mouth 3 (three) times daily.    Historical Provider, MD  lidocaine (LIDODERM) 5 % Place 1 patch onto the skin daily. Apply to back  -  Remove & Discard patch within 12 hours or as directed by MD    Historical Provider, MD  losartan (COZAAR) 100 MG tablet Take 100 mg by mouth daily.    Historical Provider, MD  lubiprostone (AMITIZA) 8 MCG capsule Take 8 mcg by mouth 2 (two) times daily with a meal. Fors constipation    Historical Provider, MD  meclizine (ANTIVERT) 25 MG tablet  Take 25 mg by mouth every 6 (six) hours as needed.    Historical Provider, MD  memantine (NAMENDA) 10 MG tablet Take 10 mg by mouth 2 (two) times daily.    Historical Provider, MD  metaxalone (SKELAXIN) 800 MG tablet Take 400 mg by mouth every 6 (six) hours as needed. For muscle spasms    Historical Provider, MD  Multiple Vitamin (MULTIVITAMIN WITH MINERALS) TABS Take 1 tablet by mouth daily.    Historical Provider, MD  olopatadine (PATANOL) 0.1 % ophthalmic solution Place 1 drop into both eyes 2 (two) times daily.     Historical Provider, MD  omega-3 acid ethyl esters (LOVAZA) 1 G capsule Take 2 g by mouth 2 (two) times daily.    Historical Provider, MD  omeprazole (PRILOSEC) 20 MG capsule Take 40 mg by mouth 2 (two) times daily before a meal.     Historical Provider, MD  OxyCODONE (OXYCONTIN) 10 mg T12A 12 hr tablet Take one tablet by mouth every 8 hours for pain. Do not crush 07/24/13   Blanchie Serve, MD  OxyCODONE HCl, Abuse Deter, 5 MG TABA Take one tablet by mouth every 4 hours as needed for pain 02/15/13   Pricilla Larsson, NP  promethazine (PHENERGAN) 25 MG tablet Take 25 mg by mouth every 6 (six) hours as needed. For nausea    Historical Provider, MD  QUEtiapine (SEROQUEL) 50 MG tablet Take 200 mg by mouth at bedtime. 100mg  qam For depressive psychosis.    Historical Provider, MD  sennosides-docusate sodium (SENOKOT-S) 8.6-50 MG tablet Take 1 tablet by mouth daily.    Historical Provider, MD  simvastatin (ZOCOR) 20 MG tablet Take 20 mg by mouth at bedtime.    Historical Provider, MD  Tafluprost (ZIOPTAN) 0.0015 % SOLN Place 1 drop into both eyes at bedtime.    Historical Provider, MD  ZOLMitriptan (ZOMIG) 2.5 MG tablet Take 2.5 mg by mouth as needed. For migraine    Historical Provider, MD  zolpidem (AMBIEN) 5 MG tablet Take one tablet by mouth every night at bedtime as needed for sleep 06/06/13   Pricilla Larsson, NP    ALLERGIES:  Allergies  Allergen Reactions  . Lyrica [Pregabalin]   .  Morphine And Related   . Penicillins   . Sulfa Antibiotics     SOCIAL HISTORY:  History  Substance Use Topics  . Smoking status: Former Smoker    Types: Cigarettes    Quit date: 03/16/1999  . Smokeless tobacco: Not on file  . Alcohol Use: No    FAMILY HISTORY: No family history on file.  EXAM: BP 83/50  Pulse 87  Temp(Src) 101.2 F (38.4 C) (Rectal)  Resp 16  SpO2 88% CONSTITUTIONAL: Patient is elderly, is able to answer questions but inappropriately, does not follow  commands, offers eyes spontaneously, hypotensive HEAD: Normocephalic EYES: Conjunctivae clear, PERRL ENT: normal nose; no rhinorrhea; moist mucous membranes; pharynx without lesions noted NECK: Supple, no meningismus, no LAD  CARD: RRR; S1 and S2 appreciated; no murmurs, no clicks, no rubs, no gallops RESP: Normal chest excursion without splinting or tachypnea; breath sounds clear and equal bilaterally; no wheezes, no rhonchi, no rales, hypoxic, dry skin around her mouth ABD/GI: Normal bowel sounds; non-distended; soft, non-tender, no rebound, no guarding BACK:  The back appears normal and is non-tender to palpation, there is no CVA tenderness EXT: Normal ROM in all joints; non-tender to palpation; no edema; normal capillary refill; no cyanosis    SKIN: Normal color for age and race; warm NEURO: Moves all extremities equally, no slurred speech the patient is not following commands or answering questions appropriately PSYCH: The patient's mood and manner are appropriate. Grooming and personal hygiene are appropriate.  MEDICAL DECISION MAKING: Patient here with altered mental status, hypotension and hypoxia. She is also febrile. Patient is likely septic. Discussed with patient's daughter Jeani Hawking at [phone] who would like workup including labs, cultures, urine, chest x-ray, IV fluids and antibiotics and admission. They confirm the patient is a DO NOT RESUSCITATE/DO NOT INTUBATE and they would not want a central line  or vasopressor started at this time. We'll obtain labs, urine, cultures and start this workup. Family is aware the patient is gravely ill and they're on their way to the hospital.  ED PROGRESS: Patient's blood pressure is improving with IV fluids and she is now much more alert and talkative. Patient's labs showed no leukocytosis. Her troponin is negative. Her lactate is normal. Her creatinine is elevated at 2.34 compared to her baseline of 0.9, she is getting IV hydration. Her oxygen saturation is 100% with a nonrebreather. Chest x-ray clear. Urine pending.  2:36 PM  Pt is now much more alert and able to hold a conversation. She does have diffuse rhonchorous sounds diffusely but is now doing well on nasal cannula. Sats 100%. Her blood pressure continues to improve and on last check was 103/70. She is receiving her second liter of IV fluid. Her PCP is Dr. Reymundo Poll. We'll consult medicine for admission as I suspect her symptoms were due to possible clinical pneumonia, aspiration.  4:08 PM  Pt is still mentating well, smiling, pleasant with no complaints. Her blood pressure however is now in 09F systolic. She has received a third liter fluid. Her family is not at bedside and still does not want a central line or vasopressors at this time. We'll update hospitalist. We'll give fourth liter of IV fluids. She is having 100% on 4 L nasal cannula   Angiocath insertion Performed by: Delice Bison Ward  Consent: Verbal consent obtained. Risks and benefits: risks, benefits and alternatives were discussed Time out: Immediately prior to procedure a "time out" was called to verify the correct patient, procedure, equipment, support staff and site/side marked as required.  Preparation: Patient was prepped and draped in the usual sterile fashion.  Vein Location: L volar forearm  Ultrasound Guided  Gauge: 20  Normal blood return and flush without difficulty Patient tolerance: Patient tolerated the procedure  well with no immediate complications.    CRITICAL CARE Performed by: Delice Bison Ward   Total critical care time: 45 minutes  Critical care time was exclusive of separately billable procedures and treating other patients.  Critical care was necessary to treat or prevent imminent or life-threatening deterioration.  Critical care was  time spent personally by me on the following activities: development of treatment plan with patient and/or surrogate as well as nursing, discussions with consultants, evaluation of patient's response to treatment, examination of patient, obtaining history from patient or surrogate, ordering and performing treatments and interventions, ordering and review of laboratory studies, ordering and review of radiographic studies, pulse oximetry and re-evaluation of patient's condition.       [place], DO 08/16/13 1458    EKG Interpretation  Date/Time:  Thursday Nisha 04 2015 15:04:12 EDT Ventricular Rate:  76 PR Interval:  194 QRS Duration: 91 QT Interval:  474 QTC Calculation: 533 R Axis:   32 Text Interpretation:  Sinus rhythm Low voltage, extremity leads Prolonged QT interval No significant change since last tracing Confirmed by WARD,  DO, KRISTEN [phone]) on 08/16/2013 3:26:17 PM        [place], DO 08/16/13 [place], DO 08/16/13 1609

## 2013-08-16 NOTE — ED Notes (Signed)
Attempted to call report to floor and nobody answered at [phone]

## 2013-08-16 NOTE — Progress Notes (Signed)
ANTIBIOTIC CONSULT NOTE - INITIAL  Pharmacy Consult for vancomycin and cefepime Indication: rule out sepsis  Allergies  Allergen Reactions  . Lyrica [Pregabalin]   . Morphine And Related   . Penicillins   . Sulfa Antibiotics     Patient Measurements:   Body Weight: 67.6 kg  Vital Signs: Temp: 101.2 F (38.4 C) (06/04 1255) Temp src: Rectal (06/04 1255) BP: 97/40 mmHg (06/04 1345) Pulse Rate: 75 (06/04 1345) Intake/Output from previous day:   Intake/Output from this shift:    Labs:  Recent Labs  08/16/13 1309 08/16/13 1338  WBC 9.6  --   HGB 12.9 13.9  PLT 220  --   CREATININE 2.34* 2.60*   The CrCl is unknown because both a height and weight (above a minimum accepted value) are required for this calculation. No results found for this basename: VANCOTROUGH, VANCOPEAK, VANCORANDOM, GENTTROUGH, GENTPEAK, GENTRANDOM, TOBRATROUGH, TOBRAPEAK, TOBRARND, AMIKACINPEAK, AMIKACINTROU, AMIKACIN,  in the last 72 hours   Microbiology: No results found for this or any previous visit (from the past 720 hour(s)).  Medical History: Past Medical History  Diagnosis Date  . Dementia   . CVA (cerebral vascular accident)   . COPD (chronic obstructive pulmonary disease)     Emphysema  . GERD (gastroesophageal reflux disease)   . Glaucoma   . Hyperlipidemia   . Insomnia   . Rhinitis 12/14/1999  . Depression 10/12/2009  . Cancer 07/18/2008    Breast cancer-Left  . Hypertension   . Arthritis 07/18/2008    osteoporosis  . Peripheral neuropathy 07/18/2008  . Cerebrovascular disease 08/05/2003    acute  . Lumbago 08/05/2003  . Edema 08/05/2003  . Cellulitis and abscess of leg 08/05/2003  . Peripheral vascular disease 08/05/2003  . Anxiety 08/05/2003  . Hypopotassemia 08/05/2003  . Anemia     Medications:  See med history Assessment: 80 yo lady admitted with AMS, hypoxia, hypotension and fever to start broad spectrum antibiotics r/o sepsis.  Her baseline SrCr is 2.6 and CrCl ~20  ml/min     LA 1.28  WBC 9.6  Goal of Therapy:  Vancomycin trough level 15-20 mcg/ml  Plan:  Vancomycin 1250 mg IV X 1 then 750 mg IV q24 hours Cefepime 2 gm IV X 1 then 1gm IV q24 hours F/u renal function, cultures and clinical course. Check VT when appropriate.  Dorothy Collins  08/16/2013,2:43 PM

## 2013-08-16 NOTE — H&P (Addendum)
[facility] History and Physical  Dorothy Collins JOA:[id number] DOB: 1930-11-18 DOA: 08/16/2013  Referring physician: EDP PCP: Nadeen Landau., MD   Chief Complaint: Unresponsiveness  HPI: Dorothy Collins is a 80 y.o. female from [facility] [facility] skilled nursing facility with past history of dementia, history of CVA, COPD, chronic back pain, anxiety, bedbound at baseline was brought to the ER today after being found unresponsive and hypoxic in her nursing home this a.m. Details regarding the event unknown, when EMS found her she had food in her mouth and one of her dentures stuck in the back of her mouth. Arrival to the emergency room she was hypoxic with O2 sat of 80%, temp of 101, blood pressure of 83/50 and unresponsive, EDP contacted family who recommended DNR/DNI, no central line no pressors. Subsequently she has improved some in terms of mentation with IV fluids and antibiotics. Patient currently at this time denies any acute complaints    Review of Systems:  Constitutional:  No weight loss, night sweats, Fevers, chills, fatigue.  HEENT:  No headaches, Difficulty swallowing,Tooth/dental problems,Sore throat,  No sneezing, itching, ear ache, nasal congestion, post nasal drip,  Cardio-vascular:  No chest pain, Orthopnea, PND, swelling in lower extremities, anasarca, dizziness, palpitations  GI:  No heartburn, indigestion, abdominal pain, nausea, vomiting, diarrhea, change in bowel habits, loss of appetite  Resp:  No shortness of breath with exertion or at rest. No excess mucus, no productive cough, No non-productive cough, No coughing up of blood.No change in color of mucus.No wheezing.No chest wall deformity  Skin:  no rash or lesions.  GU:  no dysuria, change in color of urine, no urgency or frequency. No flank pain.  Musculoskeletal:  No joint pain or swelling. No decreased range of motion. No back pain.  Psych:  No change in mood or affect. No depression or anxiety. No  memory loss.   Past Medical History  Diagnosis Date  . Dementia   . CVA (cerebral vascular accident)   . COPD (chronic obstructive pulmonary disease)     Emphysema  . GERD (gastroesophageal reflux disease)   . Glaucoma   . Hyperlipidemia   . Insomnia   . Rhinitis 12/14/1999  . Depression 10/12/2009  . Cancer 07/18/2008    Breast cancer-Left  . Hypertension   . Arthritis 07/18/2008    osteoporosis  . Peripheral neuropathy 07/18/2008  . Cerebrovascular disease 08/05/2003    acute  . Lumbago 08/05/2003  . Edema 08/05/2003  . Cellulitis and abscess of leg 08/05/2003  . Peripheral vascular disease 08/05/2003  . Anxiety 08/05/2003  . Hypopotassemia 08/05/2003  . Anemia    Past Surgical History  Procedure Laterality Date  . Carotid endarterectomy  2011    Left CEA  . Eye surgery  07/18/2008    cataract  . Fracture surgery  07/18/2008    closed  part neck femur  . Joint replacement      rt knee  . Breast surgery      rt   Social History:  reports that she quit smoking about 14 years ago. Her smoking use included Cigarettes. She smoked 0.00 packs per day. She does not have any smokeless tobacco history on file. She reports that she does not drink alcohol or use illicit drugs.  Allergies  Allergen Reactions  . Lyrica [Pregabalin]     unk  . Morphine And Related     unk  . Penicillins     unk  . Sulfa Antibiotics  unk    History reviewed. No pertinent family history.   Prior to Admission medications   Medication Sig Start Date End Date Taking? Authorizing Provider  antiseptic oral rinse (BIOTENE) LIQD by Mouth Rinse route as directed. Use twice daily as directed   Yes Historical Provider, MD  azelastine (ASTELIN) 137 MCG/SPRAY nasal spray Place 1 spray into both nostrils 2 (two) times daily. Use in each nostril as directed for uncontrolled rhinorrhea.   Yes Historical Provider, MD  Besifloxacin HCl (BESIVANCE) 0.6 % SUSP Place 1 drop into the left eye 2 (two) times daily. Instill  1 drop into left eye twice daily on 5/23, 5/24 and morning of surgery then instill 1 drop into left eye twice daily for 1 week. Wait between 2 eye meds   Yes Historical Provider, MD  bimatoprost (LUMIGAN) 0.01 % SOLN Place 1 drop into both eyes daily.   Yes Historical Provider, MD  Brinzolamide-Brimonidine [facility]) 1-0.2 % SUSP Apply 1 drop to eye 2 (two) times daily.   Yes Historical Provider, MD  calcium-vitamin D (OSCAL WITH D) 500-200 MG-UNIT per tablet Take 1 tablet by mouth 2 (two) times daily.    Yes Historical Provider, MD  cholecalciferol (VITAMIN D) 400 UNITS TABS Take 400 Units by mouth 2 (two) times daily.   Yes Historical Provider, MD  Cholecalciferol 2000 UNITS CAPS Take 2,000 Units by mouth daily.   Yes Historical Provider, MD  clonazePAM (KLONOPIN) 0.5 MG tablet Take one tablet by mouth three times daily for anxiety 03/06/13  Yes Mahima Bubba Camp, MD  clonazePAM (KLONOPIN) 0.5 MG tablet Take 0.5 mg by mouth 3 (three) times daily as needed for anxiety.   Yes Historical Provider, MD  cloNIDine (CATAPRES) 0.1 MG tablet Take 0.1 mg by mouth 2 (two) times daily as needed. For SBP> 180 & DBP > 95   Yes Historical Provider, MD  cycloSPORINE (RESTASIS) 0.05 % ophthalmic emulsion Place 1 drop into both eyes 2 (two) times daily.   Yes Historical Provider, MD  dipyridamole-aspirin (AGGRENOX) 25-200 MG per 12 hr capsule Take 1 capsule by mouth 2 (two) times daily.   Yes Historical Provider, MD  DULoxetine (CYMBALTA) 30 MG capsule Take 30 mg by mouth daily. Takes with 60 mg for total of 90mg    Yes Historical Provider, MD  DULoxetine (CYMBALTA) 60 MG capsule Take 90 mg by mouth daily. Takes with 30mg  for total of 90mg    Yes Historical Provider, MD  fexofenadine (ALLEGRA) 180 MG tablet Take 180 mg by mouth daily.   Yes Historical Provider, MD  gabapentin (NEURONTIN) 800 MG tablet Take 800 mg by mouth 3 (three) times daily.   Yes Historical Provider, MD  ketotifen (ZADITOR) 0.025 % ophthalmic solution  Place 1 drop into both eyes 2 (two) times daily. Wait 3-5 minutes between eye meds   Yes Historical Provider, MD  lidocaine (LIDODERM) 5 % Place 1 patch onto the skin daily. Apply to back  -  Remove & Discard patch within 12 hours or as directed by MD   Yes Historical Provider, MD  losartan-hydrochlorothiazide (HYZAAR) 100-25 MG per tablet Take 1 tablet by mouth daily.   Yes Historical Provider, MD  loteprednol (LOTEMAX) 0.5 % ophthalmic suspension Place 1 drop into the left eye 4 (four) times daily.   Yes Historical Provider, MD  meclizine (ANTIVERT) 25 MG tablet Take 25 mg by mouth every 6 (six) hours as needed for dizziness.    Yes Historical Provider, MD  memantine (NAMENDA) 5 MG tablet Take  5 mg by mouth 2 (two) times daily.   Yes Historical Provider, MD  omega-3 acid ethyl esters (LOVAZA) 1 G capsule Take 2 g by mouth 2 (two) times daily.   Yes Historical Provider, MD  omeprazole (PRILOSEC) 20 MG capsule Take 40 mg by mouth 2 (two) times daily before a meal.    Yes Historical Provider, MD  oxyCODONE (OXY IR/ROXICODONE) 5 MG immediate release tablet Take 5 mg by mouth every 4 (four) hours as needed for severe pain.   Yes Historical Provider, MD  Polyethyl Glycol-Propyl Glycol (SYSTANE OP) Apply 1 drop to eye every 2 (two) hours.    Yes Historical Provider, MD  risperiDONE (RISPERDAL M-TABS) 1 MG disintegrating tablet Take 1 mg by mouth 2 (two) times daily.   Yes Historical Provider, MD  sennosides-docusate sodium (SENOKOT-S) 8.6-50 MG tablet Take 1 tablet by mouth daily.   Yes Historical Provider, MD  tiZANidine (ZANAFLEX) 2 MG tablet Take 2 mg by mouth every 6 (six) hours as needed for muscle spasms.   Yes Historical Provider, MD  ZOLMitriptan (ZOMIG) 2.5 MG tablet Take 2.5 mg by mouth as needed. For migraine   Yes Historical Provider, MD  zolpidem (AMBIEN) 5 MG tablet Take one tablet by mouth every night at bedtime as needed for sleep 06/06/13  Yes Pricilla Larsson, NP   Physical Exam: Filed  Vitals:   08/16/13 1519  BP: 91/33  Pulse: 76  Temp:   Resp: 18    BP 91/33  Pulse 76  Temp(Src) 101.2 F (38.4 C) (Rectal)  Resp 18  SpO2 98%  General:  Appears calm, Alert, awake and oriented to self and partially to place HEENT: Upper dentures out Eyes: PERRL, normal lids, irises & conjunctiva ENT: grossly normal hearing, lips & tongue Neck: no LAD, masses or thyromegaly Cardiovascular: RRR, no m/r/g. No LE edema. Respiratory: CTA bilaterally, no w/r/r. Normal respiratory effort. Abdomen: soft, nontender, nondistended, bowel sounds present Skin: no rash or induration seen on limited exam Musculoskeletal: grossly normal tone BUE/BLE Psychiatric: Unable to assess Neurologic: Moves all extremities no localizing signs           Labs on Admission:  Basic Metabolic Panel:  Recent Labs Lab 08/16/13 1309 08/16/13 1338  NA 137 138  K 3.9 3.6*  CL 94* 91*  CO2 29  --   GLUCOSE 124* 125*  BUN 55* 50*  CREATININE 2.34* 2.60*  CALCIUM 9.3  --    Liver Function Tests:  Recent Labs Lab 08/16/13 1309  AST 62*  ALT 15  ALKPHOS 66  BILITOT 0.5  PROT 7.1  ALBUMIN 2.8*   No results found for this basename: LIPASE, AMYLASE,  in the last 168 hours No results found for this basename: AMMONIA,  in the last 168 hours CBC:  Recent Labs Lab 08/16/13 1309 08/16/13 1338  WBC 9.6  --   NEUTROABS 6.8  --   HGB 12.9 13.9  HCT 37.3 41.0  MCV 90.1  --   PLT 220  --    Cardiac Enzymes: No results found for this basename: CKTOTAL, CKMB, CKMBINDEX, TROPONINI,  in the last 168 hours  BNP (last 3 results) No results found for this basename: PROBNP,  in the last 8760 hours CBG: No results found for this basename: GLUCAP,  in the last 168 hours  Radiological Exams on Admission: Dg Chest Port 1 View  08/16/2013   CLINICAL DATA:  Shortness of breath and hypotension  EXAM: PORTABLE CHEST - 1 VIEW  COMPARISON:  Portable chest x-ray of October 24, 2011  FINDINGS: The lungs are  reasonably well inflated allowing for patient positioning. There is no focal infiltrate. The cardiac silhouette is mildly enlarged but stable. The pulmonary vascularity is not engorged. There is no pleural effusion or pneumothorax. The bony structures are unremarkable.  IMPRESSION: There is no active cardiopulmonary disease.   Electronically Signed   By: David  Martinique   On: 08/16/2013 13:45    EKG: Independently reviewed. NSR, T wave inversions, QTc prolonged at 572msec  Assessment/Plan    Sepsis -Suspect aspiration related event, as etiology -FU UA -Empiric vancomycin and cefepime for now -Followup blood cultures -Repeat chest x-ray in 24-48 hours -I Had long discussion with daughter Jeani Hawking and son Shanon Brow: DNR/DNI, or no pressors no central line and no further escalation of care confirmed. -If she declines tonight or down the road, plan for comfort measures confirmed with both children   Hypotension -Due to 1 -Supportive care, IV fluids through peripheral line only -No escalation of care in the event of decline   Vascular dementia without behavioral disturbance -continue namenda    H/O: CVA (cerebrovascular accident) -continue aggrenox, pending swallow eval    COPD -stable, nebs PRN    Depression/anxiety -continue cymbalta, clonopin at lower dose  DVt proph: lovenox  Code Status: DNR Family Communication: none at bedside, called and left daughter to message Disposition Plan: Admit to SDU  Time spent: 35min  Rada Zegers [facility] Pager [phone]  **Disclaimer: This note may have been dictated with voice recognition software. Similar sounding words can inadvertently be transcribed and this note may contain transcription errors which may not have been corrected upon publication of note.**

## 2013-08-16 NOTE — ED Notes (Signed)
3rd floor called and paged provider d/t acuity and level of care. Provider going to reorder for step down so room assignment will change

## 2013-08-17 ENCOUNTER — Inpatient Hospital Stay (HOSPITAL_COMMUNITY): Payer: Medicare Other

## 2013-08-17 LAB — COMPREHENSIVE METABOLIC PANEL
ALT: 20 U/L (ref 0–35)
AST: 82 U/L — ABNORMAL HIGH (ref 0–37)
Albumin: 2.2 g/dL — ABNORMAL LOW (ref 3.5–5.2)
Alkaline Phosphatase: 60 U/L (ref 39–117)
BUN: 34 mg/dL — AB (ref 6–23)
CALCIUM: 8.1 mg/dL — AB (ref 8.4–10.5)
CO2: 24 mEq/L (ref 19–32)
Chloride: 106 mEq/L (ref 96–112)
Creatinine, Ser: 0.89 mg/dL (ref 0.50–1.10)
GFR calc Af Amer: 69 mL/min — ABNORMAL LOW (ref >90)
GFR calc non Af Amer: 60 mL/min — ABNORMAL LOW (ref >90)
Glucose, Bld: 89 mg/dL (ref 70–99)
Potassium: 3.4 mEq/L — ABNORMAL LOW (ref 3.7–5.3)
SODIUM: 143 meq/L (ref 137–147)
Total Bilirubin: 0.3 mg/dL (ref 0.3–1.2)
Total Protein: 5.9 g/dL — ABNORMAL LOW (ref 6.0–8.3)

## 2013-08-17 LAB — CBC
HCT: 38.6 % (ref 36.0–46.0)
HEMOGLOBIN: 12.3 g/dL (ref 12.0–15.0)
MCH: 29.6 pg (ref 26.0–34.0)
MCHC: 31.9 g/dL (ref 30.0–36.0)
MCV: 93 fL (ref 78.0–100.0)
Platelets: 170 10*3/uL (ref 150–400)
RBC: 4.15 MIL/uL (ref 3.87–5.11)
RDW: 13.2 % (ref 11.5–15.5)
WBC: 10.9 10*3/uL — ABNORMAL HIGH (ref 4.0–10.5)

## 2013-08-17 LAB — URINE CULTURE
COLONY COUNT: NO GROWTH
Culture: NO GROWTH

## 2013-08-17 LAB — OCCULT BLOOD, POC DEVICE: FECAL OCCULT BLD: POSITIVE — AB

## 2013-08-17 MED ORDER — ENOXAPARIN SODIUM 40 MG/0.4ML ~~LOC~~ SOLN
40.0000 mg | SUBCUTANEOUS | Status: DC
  Administered 2013-08-17 – 2013-08-19 (×3): 40 mg via SUBCUTANEOUS
  Filled 2013-08-17 (×4): qty 0.4

## 2013-08-17 NOTE — Progress Notes (Signed)
Patient is a transfer from 3S. Patient alert and oriented with some confusion. Will continue to monitor.

## 2013-08-17 NOTE — Progress Notes (Signed)
[facility] PROGRESS NOTE  Dorothy Collins VFI:[id number] DOB: Feb 28, 1931 DOA: 08/16/2013 PCP: Nadeen Landau., MD  Assessment/Plan: Sepsis  -Suspect aspiration related event, as etiology  -UA unremarkable -Continue Vancomycin and cefepime -Followup blood cultures  -Repeat chest x-ray today  -I had long discussion with daughter Jeani Hawking and son Shanon Brow: DNR/DNI, or no pressors no central line and no further escalation of care confirmed.  -clinically improved and stable now  Hypotension  -due to 1 -improved, IVF  Vascular dementia without behavioral disturbance  -continue namenda   H/O: CVA (cerebrovascular accident)  -continue aggrenox, pending swallow eval   COPD  -stable, nebs PRN   Depression/anxiety  -continue cymbalta, klonopin at lower dose   Chronic back pain -continue oxycodone PRN  DVt proph: lovenox  Code Status: DNR Family Communication: none at bedside, will call daughter later today Disposition Plan: transfer to floor   HPI/Subjective: No complaints, wants to drink coffee  Objective: Filed Vitals:   08/17/13 1100  BP:   Pulse:   Temp: 98.4 F (36.9 C)  Resp:     Intake/Output Summary (Last 24 hours) at 08/17/13 1319 Last data filed at 08/17/13 0900  Gross per 24 hour  Intake   1625 ml  Output   1360 ml  Net    265 ml   Filed Weights   08/16/13 1903  Weight: 72.4 kg (159 lb 9.8 oz)    Exam:   General:  AAOx to self and place  Cardiovascular: S1S2/RRR  Respiratory: CTAB  Abdomen: soft, Nt, BS present  Musculoskeletal: no edema c/c   Data Reviewed: Basic Metabolic Panel:  Recent Labs Lab 08/16/13 1309 08/16/13 1338 08/17/13 0347  NA 137 138 143  K 3.9 3.6* 3.4*  CL 94* 91* 106  CO2 29  --  24  GLUCOSE 124* 125* 89  BUN 55* 50* 34*  CREATININE 2.34* 2.60* 0.89  CALCIUM 9.3  --  8.1*   Liver Function Tests:  Recent Labs Lab 08/16/13 1309 08/17/13 0347  AST 62* 82*  ALT 15 20  ALKPHOS 66 60  BILITOT 0.5 0.3   PROT 7.1 5.9*  ALBUMIN 2.8* 2.2*   No results found for this basename: LIPASE, AMYLASE,  in the last 168 hours No results found for this basename: AMMONIA,  in the last 168 hours CBC:  Recent Labs Lab 08/16/13 1309 08/16/13 1338 08/17/13 0347  WBC 9.6  --  10.9*  NEUTROABS 6.8  --   --   HGB 12.9 13.9 12.3  HCT 37.3 41.0 38.6  MCV 90.1  --  93.0  PLT 220  --  170   Cardiac Enzymes: No results found for this basename: CKTOTAL, CKMB, CKMBINDEX, TROPONINI,  in the last 168 hours BNP (last 3 results) No results found for this basename: PROBNP,  in the last 8760 hours CBG: No results found for this basename: GLUCAP,  in the last 168 hours  Recent Results (from the past 240 hour(s))  CULTURE, BLOOD (ROUTINE X 2)     Status: None   Collection Time    08/16/13  1:09 PM      Result Value Ref Range Status   Specimen Description BLOOD RIGHT ANTECUBITAL   Final   Special Requests BOTTLES DRAWN AEROBIC AND ANAEROBIC 5MLS   Final   Culture  Setup Time     Final   Value: 08/16/2013 17:56     Performed at [facility]   Culture     Final   Value:  BLOOD CULTURE RECEIVED NO GROWTH TO DATE CULTURE WILL BE HELD FOR 5 DAYS BEFORE ISSUING A FINAL NEGATIVE REPORT     Performed at [facility]   Report Status PENDING   Incomplete  CULTURE, BLOOD (ROUTINE X 2)     Status: None   Collection Time    08/16/13  1:21 PM      Result Value Ref Range Status   Specimen Description BLOOD LEFT ANTECUBITAL   Final   Special Requests BOTTLES DRAWN AEROBIC AND ANAEROBIC 5MLS   Final   Culture  Setup Time     Final   Value: 08/16/2013 17:57     Performed at [facility]   Culture     Final   Value:        BLOOD CULTURE RECEIVED NO GROWTH TO DATE CULTURE WILL BE HELD FOR 5 DAYS BEFORE ISSUING A FINAL NEGATIVE REPORT     Performed at [facility]   Report Status PENDING   Incomplete  MRSA PCR SCREENING     Status: None   Collection Time    08/16/13  7:35 PM       Result Value Ref Range Status   MRSA by PCR NEGATIVE  NEGATIVE Final   Comment:            The GeneXpert MRSA Assay (FDA     approved for NASAL specimens     only), is one component of a     comprehensive MRSA colonization     surveillance program. It is not     intended to diagnose MRSA     infection nor to guide or     monitor treatment for     MRSA infections.     Studies: Dg Chest 2 View  08/17/2013   CLINICAL DATA:  Fall.  EXAM: CHEST  2 VIEW  COMPARISON:  08/16/2013  FINDINGS: Heart is upper limits normal in size. Minimal right base atelectasis. Biapical scarring. No effusions. No acute bony abnormality.  IMPRESSION: Minimal right basilar atelectasis.  No acute findings.   Electronically Signed   By: Rolm Baptise M.D.   On: 08/17/2013 09:41   Dg Chest Port 1 View  08/16/2013   CLINICAL DATA:  Shortness of breath and hypotension  EXAM: PORTABLE CHEST - 1 VIEW  COMPARISON:  Portable chest x-ray of October 24, 2011  FINDINGS: The lungs are reasonably well inflated allowing for patient positioning. There is no focal infiltrate. The cardiac silhouette is mildly enlarged but stable. The pulmonary vascularity is not engorged. There is no pleural effusion or pneumothorax. The bony structures are unremarkable.  IMPRESSION: There is no active cardiopulmonary disease.   Electronically Signed   By: David  Martinique   On: 08/16/2013 13:45    Scheduled Meds: . azelastine  1 spray Each Nare BID  . ceFEPime (MAXIPIME) IV  1 g Intravenous Q24H  . cycloSPORINE  1 drop Both Eyes BID  . dipyridamole-aspirin  1 capsule Oral BID  . DULoxetine  30 mg Oral Daily  . enoxaparin (LOVENOX) injection  40 mg Subcutaneous Q24H  . gabapentin  400 mg Oral TID  . latanoprost  1 drop Both Eyes QHS  . loteprednol  1 drop Left Eye QID  . memantine  5 mg Oral BID  . pantoprazole  40 mg Oral Daily  . polyvinyl alcohol  1 drop Both Eyes BID  . risperiDONE  1 mg Oral BID  . senna-docusate  1 tablet Oral QHS  .  vancomycin  750 mg Intravenous Q24H   Continuous Infusions: . sodium chloride 100 mL/hr at 08/16/13 1915   Antibiotics Given (last 72 hours)   None      Principal Problem:   Sepsis Active Problems:   Chronic back pain   Vascular dementia without behavioral disturbance   Hypotension   Aspiration into airway   H/O: CVA (cerebrovascular accident)   Hypoxia   AKI (acute kidney injury)    Time spent: 73min    Bao Bazen  [facility] Pager [phone]. If 7PM-7AM, please contact night-coverage at [email or web address], password [facility] 08/17/2013, 1:19 PM  LOS: 1 day

## 2013-08-17 NOTE — Evaluation (Signed)
Clinical/Bedside Swallow Evaluation Patient Details  Name: Dorothy Collins MRN: [id number] Date of Birth: [birth year]  Today's Date: 08/17/2013 Time: 1110-1130 SLP Time Calculation (min): 20 min  Past Medical History:  Past Medical History  Diagnosis Date  . Dementia   . CVA (cerebral vascular accident)   . COPD (chronic obstructive pulmonary disease)     Emphysema  . GERD (gastroesophageal reflux disease)   . Glaucoma   . Hyperlipidemia   . Insomnia   . Rhinitis 12/14/1999  . Depression 10/12/2009  . Cancer 07/18/2008    Breast cancer-Left  . Hypertension   . Arthritis 07/18/2008    osteoporosis  . Peripheral neuropathy 07/18/2008  . Cerebrovascular disease 08/05/2003    acute  . Lumbago 08/05/2003  . Edema 08/05/2003  . Cellulitis and abscess of leg 08/05/2003  . Peripheral vascular disease 08/05/2003  . Anxiety 08/05/2003  . Hypopotassemia 08/05/2003  . Anemia    Past Surgical History:  Past Surgical History  Procedure Laterality Date  . Carotid endarterectomy  2011    Left CEA  . Eye surgery  07/18/2008    cataract  . Fracture surgery  07/18/2008    closed  part neck femur  . Joint replacement      rt knee  . Breast surgery      rt   HPI:  Dorothy Collins is a 80 y.o. female from [place] skilled nursing facility with past history of dementia, history of CVA, COPD, chronic back pain, anxiety, bedbound at baseline was brought to the ER today after being found unresponsive and hypoxic in her nursing home on 6/4. When EMS found her she had food in her mouth and one of her dentures stuck in the back of her mouth. Pt has a history of CVA, had several MBS in the past with the last in 2010 showing flash penetration of thin liquids, recommended Regular diet/thin liquids.   Assessment / Plan / Recommendation Clinical Impression  Pt demonstrates adequate swallow function with liquids and presentation is consistent with MBS report in 2010 of adequate airway protection. She does have ill  fitting bottom dentures that float in her mouth. She uses adhesive on her top denture, but agrees that her bottom denture does not fit well. Unsure which denture was found in her throat, but likely it was the bottom. Recommend she wear her top denture with adhesive for meals and leave bottom denture out permanently. With the top denture in place she is able to manipulate very soft foods, but not really masticate. Recommend a Dys 2 (finely chopped/minced diet) with thin liquids. SLP will f/u for tolerance and education with family members as pts cognition is not adequate for new learning.     Aspiration Risk  Moderate    Diet Recommendation Dysphagia 2 (Fine chop);Thin liquid   Liquid Administration via: Cup;Straw Medication Administration: Whole meds with puree Supervision: Staff to assist with self feeding;Full supervision/cueing for compensatory strategies Compensations: Slow rate;Small sips/bites Postural Changes and/or Swallow Maneuvers: Seated upright 90 degrees    Other  Recommendations Oral Care Recommendations: Oral care BID Other Recommendations: Other (Comment) (Do not place bottom denutre. )   Follow Up Recommendations       Frequency and Duration min 2x/week  1 week   Pertinent Vitals/Pain NA    SLP Swallow Goals     Swallow Study Prior Functional Status       General HPI: Dorothy Collins is a 80 y.o. female from [place] skilled  nursing facility with past history of dementia, history of CVA, COPD, chronic back pain, anxiety, bedbound at baseline was brought to the ER today after being found unresponsive and hypoxic in her nursing home on 6/4. When EMS found her she had food in her mouth and one of her dentures stuck in the back of her mouth. Pt has a history of CVA, had several MBS in the past with the last in 2010 showing flash penetration of thin liquids, recommended Regular diet/thin liquids. Type of Study: Bedside swallow evaluation Previous Swallow Assessment: see  HPI Diet Prior to this Study: NPO Temperature Spikes Noted: No Respiratory Status: Nasal cannula History of Recent Intubation: No Behavior/Cognition: Alert;Cooperative;Confused;Pleasant mood Oral Cavity - Dentition: Dentures, top;Dentures, bottom;Edentulous Self-Feeding Abilities: Able to feed self;Needs assist Patient Positioning: Upright in bed Baseline Vocal Quality: Clear Volitional Cough: Strong Volitional Swallow: Able to elicit    Oral/Motor/Sensory Function Overall Oral Motor/Sensory Function: Appears within functional limits for tasks assessed   Ice Chips     Thin Liquid Thin Liquid: Within functional limits Presentation: Cup;Straw    Nectar Thick Nectar Thick Liquid: Not tested   Honey Thick Honey Thick Liquid: Not tested   Puree Puree: Within functional limits   Solid   GO    Solid: Impaired Presentation: Self Fed Oral Phase Impairments: Impaired mastication      Herbie Baltimore, MA CCC-SLP [phone]  Dorothy Collins 08/17/2013,11:41 AM

## 2013-08-17 NOTE — Progress Notes (Signed)
Pt transferred from ED with RN on monitor. Pt alert to self and place, pt VSS, family at bedside. Will continue to monitor.

## 2013-08-17 NOTE — Progress Notes (Signed)
Utilization review completed.  

## 2013-08-18 DIAGNOSIS — I150 Renovascular hypertension: Secondary | ICD-10-CM

## 2013-08-18 LAB — BASIC METABOLIC PANEL
BUN: 12 mg/dL (ref 6–23)
CHLORIDE: 103 meq/L (ref 96–112)
CO2: 24 meq/L (ref 19–32)
Calcium: 8.6 mg/dL (ref 8.4–10.5)
Creatinine, Ser: 0.47 mg/dL — ABNORMAL LOW (ref 0.50–1.10)
GFR calc Af Amer: >90 mL/min (ref >90)
GFR calc non Af Amer: >90 mL/min (ref >90)
Glucose, Bld: 97 mg/dL (ref 70–99)
POTASSIUM: 2.8 meq/L — AB (ref 3.7–5.3)
SODIUM: 140 meq/L (ref 137–147)

## 2013-08-18 LAB — CBC
HCT: 35.6 % — ABNORMAL LOW (ref 36.0–46.0)
Hemoglobin: 11.7 g/dL — ABNORMAL LOW (ref 12.0–15.0)
MCH: 29.7 pg (ref 26.0–34.0)
MCHC: 32.9 g/dL (ref 30.0–36.0)
MCV: 90.4 fL (ref 78.0–100.0)
PLATELETS: 177 10*3/uL (ref 150–400)
RBC: 3.94 MIL/uL (ref 3.87–5.11)
RDW: 13 % (ref 11.5–15.5)
WBC: 8.4 10*3/uL (ref 4.0–10.5)

## 2013-08-18 MED ORDER — CLONIDINE HCL 0.1 MG PO TABS
0.1000 mg | ORAL_TABLET | Freq: Two times a day (BID) | ORAL | Status: DC
  Administered 2013-08-18 – 2013-08-20 (×5): 0.1 mg via ORAL
  Filled 2013-08-18 (×6): qty 1

## 2013-08-18 MED ORDER — LEVOFLOXACIN 500 MG PO TABS
500.0000 mg | ORAL_TABLET | Freq: Every day | ORAL | Status: DC
  Administered 2013-08-18 – 2013-08-20 (×3): 500 mg via ORAL
  Filled 2013-08-18 (×3): qty 1

## 2013-08-18 MED ORDER — POTASSIUM CHLORIDE CRYS ER 20 MEQ PO TBCR
40.0000 meq | EXTENDED_RELEASE_TABLET | Freq: Once | ORAL | Status: AC
  Administered 2013-08-18: 40 meq via ORAL
  Filled 2013-08-18: qty 2

## 2013-08-18 NOTE — Progress Notes (Addendum)
[facility] PROGRESS NOTE  Dorothy Collins YQM:[id number] DOB: 1931/03/01 DOA: 08/16/2013 PCP: Nadeen Landau., MD  Assessment/Plan: Sepsis  -Suspect aspiration related event, as etiology  -UA unremarkable -Day 2 of Vancomycin and cefepime, change to Po levaquin, PCN allergy -Blood cultures negative so far -Repeat chest x-ray 6/5: with R base atx: likely pneumonia  Hypotension  -due to 1 -resolved  HTN: -resume clonidine  Vascular dementia without behavioral disturbance  -continue namenda, risperidone  H/O: CVA (cerebrovascular accident)  -continue aggrenox, pending swallow eval   COPD  -stable, nebs PRN   Depression/anxiety  -continue cymbalta, klonopin at lower dose   Chronic back pain -continue oxycodone PRN  Hypokalemia -replace  DVt proph: lovenox  Code Status: DNR Family Communication: none at bedside, called and updated daughter Disposition Plan: SNF Monday   HPI/Subjective: No complaints, didn't eat much breakfast  Objective: Filed Vitals:   08/18/13 0952  BP: 168/75  Pulse: 99  Temp: 98.7 F (37.1 C)  Resp: 21    Intake/Output Summary (Last 24 hours) at 08/18/13 1025 Last data filed at 08/17/13 1400  Gross per 24 hour  Intake    740 ml  Output      0 ml  Net    740 ml   Filed Weights   08/16/13 1903  Weight: 72.4 kg (159 lb 9.8 oz)    Exam:   General:  AAOx to self and place  Cardiovascular: S1S2/RRR  Respiratory: ronchi at R base, rest clear  Abdomen: soft, Nt, BS present  Musculoskeletal: no edema c/c   Data Reviewed: Basic Metabolic Panel:  Recent Labs Lab 08/16/13 1309 08/16/13 1338 08/17/13 0347 08/18/13 0500  NA 137 138 143 140  K 3.9 3.6* 3.4* 2.8*  CL 94* 91* 106 103  CO2 29  --  24 24  GLUCOSE 124* 125* 89 97  BUN 55* 50* 34* 12  CREATININE 2.34* 2.60* 0.89 0.47*  CALCIUM 9.3  --  8.1* 8.6   Liver Function Tests:  Recent Labs Lab 08/16/13 1309 08/17/13 0347  AST 62* 82*  ALT 15 20   ALKPHOS 66 60  BILITOT 0.5 0.3  PROT 7.1 5.9*  ALBUMIN 2.8* 2.2*   No results found for this basename: LIPASE, AMYLASE,  in the last 168 hours No results found for this basename: AMMONIA,  in the last 168 hours CBC:  Recent Labs Lab 08/16/13 1309 08/16/13 1338 08/17/13 0347 08/18/13 0500  WBC 9.6  --  10.9* 8.4  NEUTROABS 6.8  --   --   --   HGB 12.9 13.9 12.3 11.7*  HCT 37.3 41.0 38.6 35.6*  MCV 90.1  --  93.0 90.4  PLT 220  --  170 177   Cardiac Enzymes: No results found for this basename: CKTOTAL, CKMB, CKMBINDEX, TROPONINI,  in the last 168 hours BNP (last 3 results) No results found for this basename: PROBNP,  in the last 8760 hours CBG: No results found for this basename: GLUCAP,  in the last 168 hours  Recent Results (from the past 240 hour(s))  CULTURE, BLOOD (ROUTINE X 2)     Status: None   Collection Time    08/16/13  1:09 PM      Result Value Ref Range Status   Specimen Description BLOOD RIGHT ANTECUBITAL   Final   Special Requests BOTTLES DRAWN AEROBIC AND ANAEROBIC 5MLS   Final   Culture  Setup Time     Final   Value: 08/16/2013 17:56  Performed at [facility]     Final   Value:        BLOOD CULTURE RECEIVED NO GROWTH TO DATE CULTURE WILL BE HELD FOR 5 DAYS BEFORE ISSUING A FINAL NEGATIVE REPORT     Performed at [facility]   Report Status PENDING   Incomplete  CULTURE, BLOOD (ROUTINE X 2)     Status: None   Collection Time    08/16/13  1:21 PM      Result Value Ref Range Status   Specimen Description BLOOD LEFT ANTECUBITAL   Final   Special Requests BOTTLES DRAWN AEROBIC AND ANAEROBIC 5MLS   Final   Culture  Setup Time     Final   Value: 08/16/2013 17:57     Performed at [facility]   Culture     Final   Value:        BLOOD CULTURE RECEIVED NO GROWTH TO DATE CULTURE WILL BE HELD FOR 5 DAYS BEFORE ISSUING A FINAL NEGATIVE REPORT     Performed at [facility]   Report Status PENDING   Incomplete   URINE CULTURE     Status: None   Collection Time    08/16/13  4:35 PM      Result Value Ref Range Status   Specimen Description URINE, CATHETERIZED   Final   Special Requests NONE   Final   Culture  Setup Time     Final   Value: 08/16/2013 22:20     Performed at [facility]     Final   Value: NO GROWTH     Performed at [facility]   Culture     Final   Value: NO GROWTH     Performed at [facility]   Report Status 08/17/2013 FINAL   Final  MRSA PCR SCREENING     Status: None   Collection Time    08/16/13  7:35 PM      Result Value Ref Range Status   MRSA by PCR NEGATIVE  NEGATIVE Final   Comment:            The GeneXpert MRSA Assay (FDA     approved for NASAL specimens     only), is one component of a     comprehensive MRSA colonization     surveillance program. It is not     intended to diagnose MRSA     infection nor to guide or     monitor treatment for     MRSA infections.     Studies: Dg Chest 2 View  08/17/2013   CLINICAL DATA:  Fall.  EXAM: CHEST  2 VIEW  COMPARISON:  08/16/2013  FINDINGS: Heart is upper limits normal in size. Minimal right base atelectasis. Biapical scarring. No effusions. No acute bony abnormality.  IMPRESSION: Minimal right basilar atelectasis.  No acute findings.   Electronically Signed   By: Rolm Baptise M.D.   On: 08/17/2013 09:41   Dg Chest Port 1 View  08/16/2013   CLINICAL DATA:  Shortness of breath and hypotension  EXAM: PORTABLE CHEST - 1 VIEW  COMPARISON:  Portable chest x-ray of October 24, 2011  FINDINGS: The lungs are reasonably well inflated allowing for patient positioning. There is no focal infiltrate. The cardiac silhouette is mildly enlarged but stable. The pulmonary vascularity is not engorged. There is no pleural effusion or pneumothorax. The bony structures are unremarkable.  IMPRESSION: There is no active cardiopulmonary disease.   Electronically Signed   By: David  Martinique   On: 08/16/2013  13:45    Scheduled Meds: . azelastine  1 spray Each Nare BID  . cycloSPORINE  1 drop Both Eyes BID  . dipyridamole-aspirin  1 capsule Oral BID  . DULoxetine  30 mg Oral Daily  . enoxaparin (LOVENOX) injection  40 mg Subcutaneous Q24H  . gabapentin  400 mg Oral TID  . latanoprost  1 drop Both Eyes QHS  . levofloxacin  500 mg Oral Daily  . loteprednol  1 drop Left Eye QID  . memantine  5 mg Oral BID  . pantoprazole  40 mg Oral Daily  . polyvinyl alcohol  1 drop Both Eyes BID  . potassium chloride  40 mEq Oral Once  . risperiDONE  1 mg Oral BID  . senna-docusate  1 tablet Oral QHS   Continuous Infusions:   Antibiotics Given (last 72 hours)   Date/Time Action Medication Dose Rate   08/17/13 1530 Given   vancomycin (VANCOCIN) IVPB 750 mg/150 ml premix 750 mg 150 mL/hr   08/17/13 1530 Given   ceFEPIme (MAXIPIME) 1 g in dextrose 5 % 50 mL IVPB 1 g 100 mL/hr      Principal Problem:   Sepsis Active Problems:   Chronic back pain   Vascular dementia without behavioral disturbance   Hypotension   Aspiration into airway   H/O: CVA (cerebrovascular accident)   Hypoxia   AKI (acute kidney injury)    Time spent: 36min    Kamela Blansett  [facility] Pager [phone]. If 7PM-7AM, please contact night-coverage at [email or web address], password [facility] 08/18/2013, 10:25 AM  LOS: 2 days

## 2013-08-18 NOTE — Progress Notes (Addendum)
cCRITICAL VALUE ALERT  Critical value received:  K +  Date of notification:  08/18/13  Time of notification:  0607  Critical value read back: yes  Nurse who received alert: Nesta Kimple [place] RN  MD notified (1st page):  A. Lissa Merlin   Time of first page:  0607  MD notified (2nd page):  Time of second page:  Responding MD:  Otis Dials   Time MD responded:  [phone]

## 2013-08-19 LAB — BASIC METABOLIC PANEL
BUN: 11 mg/dL (ref 6–23)
CHLORIDE: 101 meq/L (ref 96–112)
CO2: 26 meq/L (ref 19–32)
Calcium: 9 mg/dL (ref 8.4–10.5)
Creatinine, Ser: 0.54 mg/dL (ref 0.50–1.10)
GFR calc Af Amer: >90 mL/min (ref >90)
GFR calc non Af Amer: 87 mL/min — ABNORMAL LOW (ref >90)
Glucose, Bld: 110 mg/dL — ABNORMAL HIGH (ref 70–99)
POTASSIUM: 3.5 meq/L — AB (ref 3.7–5.3)
Sodium: 142 mEq/L (ref 137–147)

## 2013-08-19 LAB — CBC
HEMATOCRIT: 37.6 % (ref 36.0–46.0)
Hemoglobin: 12.4 g/dL (ref 12.0–15.0)
MCH: 29.1 pg (ref 26.0–34.0)
MCHC: 33 g/dL (ref 30.0–36.0)
MCV: 88.3 fL (ref 78.0–100.0)
Platelets: 249 10*3/uL (ref 150–400)
RBC: 4.26 MIL/uL (ref 3.87–5.11)
RDW: 12.8 % (ref 11.5–15.5)
WBC: 9 10*3/uL (ref 4.0–10.5)

## 2013-08-19 NOTE — Progress Notes (Signed)
Clinical Education officer, museum (CSW) let message with patient's son Shanon Brow to talk about D/C plan. Per chart patient is from [facility]. CSW completed FL2 and sent updated clinicals to [facility].   Blima Rich, [place] Weekend CSW [phone]

## 2013-08-19 NOTE — Progress Notes (Signed)
[facility] PROGRESS NOTE  Dorothy Collins ZGY:[id number] DOB: [birth year] DOA: 08/16/2013 PCP: Nadeen Landau., MD  Assessment/Plan: Sepsis  -due to aspiration  -Repeat chest x-ray 6/5: with R base atx: likely pneumonia -UA unremarkable -s/p Day 2 of Vancomycin and cefepime, changed to Po levaquin 6/6, PCN allergy -Blood cultures negative so far -complete 7day course of abx  Hypotension  -due to 1 -resolved  HTN: -continue clonidine  Vascular dementia without behavioral disturbance  -continue namenda, risperidone  H/O: CVA (cerebrovascular accident)  -continue aggrenox, completed swallow eval   COPD  -stable, nebs PRN   Depression/anxiety  -continue cymbalta, klonopin at lower dose   Chronic back pain -continue oxycodone PRN  Hypokalemia -replaced  DVt proph: lovenox  Code Status: DNR Family Communication: none at bedside, called and updated daughter 6/6 Disposition Plan: SNF Monday   HPI/Subjective: No complaints, didn't eat much breakfast -angry, but wouldn't tell me why  Objective: Filed Vitals:   08/19/13 0700  BP: 164/83  Pulse: 94  Temp: 98.2 F (36.8 C)  Resp: 20   No intake or output data in the 24 hours ending 08/19/13 1007 Filed Weights   08/16/13 1903  Weight: 72.4 kg (159 lb 9.8 oz)    Exam:   General:  AAOx to self and place  Cardiovascular: S1S2/RRR  Respiratory: ronchi at R base, rest clear  Abdomen: soft, Nt, BS present  Musculoskeletal: no edema c/c   Data Reviewed: Basic Metabolic Panel:  Recent Labs Lab 08/16/13 1309 08/16/13 1338 08/17/13 0347 08/18/13 0500 08/19/13 0447  NA 137 138 143 140 142  K 3.9 3.6* 3.4* 2.8* 3.5*  CL 94* 91* 106 103 101  CO2 29  --  24 24 26   GLUCOSE 124* 125* 89 97 110*  BUN 55* 50* 34* 12 11  CREATININE 2.34* 2.60* 0.89 0.47* 0.54  CALCIUM 9.3  --  8.1* 8.6 9.0   Liver Function Tests:  Recent Labs Lab 08/16/13 1309 08/17/13 0347  AST 62* 82*  ALT 15 20  ALKPHOS 66  60  BILITOT 0.5 0.3  PROT 7.1 5.9*  ALBUMIN 2.8* 2.2*   No results found for this basename: LIPASE, AMYLASE,  in the last 168 hours No results found for this basename: AMMONIA,  in the last 168 hours CBC:  Recent Labs Lab 08/16/13 1309 08/16/13 1338 08/17/13 0347 08/18/13 0500 08/19/13 0447  WBC 9.6  --  10.9* 8.4 9.0  NEUTROABS 6.8  --   --   --   --   HGB 12.9 13.9 12.3 11.7* 12.4  HCT 37.3 41.0 38.6 35.6* 37.6  MCV 90.1  --  93.0 90.4 88.3  PLT 220  --  170 177 249   Cardiac Enzymes: No results found for this basename: CKTOTAL, CKMB, CKMBINDEX, TROPONINI,  in the last 168 hours BNP (last 3 results) No results found for this basename: PROBNP,  in the last 8760 hours CBG: No results found for this basename: GLUCAP,  in the last 168 hours  Recent Results (from the past 240 hour(s))  CULTURE, BLOOD (ROUTINE X 2)     Status: None   Collection Time    08/16/13  1:09 PM      Result Value Ref Range Status   Specimen Description BLOOD RIGHT ANTECUBITAL   Final   Special Requests BOTTLES DRAWN AEROBIC AND ANAEROBIC 5MLS   Final   Culture  Setup Time     Final   Value: 08/16/2013 17:56     Performed at  [facility]     Final   Value:        BLOOD CULTURE RECEIVED NO GROWTH TO DATE CULTURE WILL BE HELD FOR 5 DAYS BEFORE ISSUING A FINAL NEGATIVE REPORT     Performed at [facility]   Report Status PENDING   Incomplete  CULTURE, BLOOD (ROUTINE X 2)     Status: None   Collection Time    08/16/13  1:21 PM      Result Value Ref Range Status   Specimen Description BLOOD LEFT ANTECUBITAL   Final   Special Requests BOTTLES DRAWN AEROBIC AND ANAEROBIC 5MLS   Final   Culture  Setup Time     Final   Value: 08/16/2013 17:57     Performed at [facility]   Culture     Final   Value:        BLOOD CULTURE RECEIVED NO GROWTH TO DATE CULTURE WILL BE HELD FOR 5 DAYS BEFORE ISSUING A FINAL NEGATIVE REPORT     Performed at [facility]   Report  Status PENDING   Incomplete  URINE CULTURE     Status: None   Collection Time    08/16/13  4:35 PM      Result Value Ref Range Status   Specimen Description URINE, CATHETERIZED   Final   Special Requests NONE   Final   Culture  Setup Time     Final   Value: 08/16/2013 22:20     Performed at [place]     Final   Value: NO GROWTH     Performed at [facility]   Culture     Final   Value: NO GROWTH     Performed at [facility]   Report Status 08/17/2013 FINAL   Final  MRSA PCR SCREENING     Status: None   Collection Time    08/16/13  7:35 PM      Result Value Ref Range Status   MRSA by PCR NEGATIVE  NEGATIVE Final   Comment:            The GeneXpert MRSA Assay (FDA     approved for NASAL specimens     only), is one component of a     comprehensive MRSA colonization     surveillance program. It is not     intended to diagnose MRSA     infection nor to guide or     monitor treatment for     MRSA infections.     Studies: No results found.  Scheduled Meds: . azelastine  1 spray Each Nare BID  . cloNIDine  0.1 mg Oral BID  . cycloSPORINE  1 drop Both Eyes BID  . dipyridamole-aspirin  1 capsule Oral BID  . DULoxetine  30 mg Oral Daily  . enoxaparin (LOVENOX) injection  40 mg Subcutaneous Q24H  . gabapentin  400 mg Oral TID  . latanoprost  1 drop Both Eyes QHS  . levofloxacin  500 mg Oral Daily  . loteprednol  1 drop Left Eye QID  . memantine  5 mg Oral BID  . pantoprazole  40 mg Oral Daily  . polyvinyl alcohol  1 drop Both Eyes BID  . risperiDONE  1 mg Oral BID  . senna-docusate  1 tablet Oral QHS   Continuous Infusions:   Antibiotics Given (last 72 hours)   Date/Time Action Medication Dose Rate  08/17/13 1530 Given   vancomycin (VANCOCIN) IVPB 750 mg/150 ml premix 750 mg 150 mL/hr   08/17/13 1530 Given   ceFEPIme (MAXIPIME) 1 g in dextrose 5 % 50 mL IVPB 1 g 100 mL/hr   08/18/13 1128 Given   levofloxacin (LEVAQUIN)  tablet 500 mg 500 mg       Principal Problem:   Sepsis Active Problems:   Chronic back pain   Vascular dementia without behavioral disturbance   Hypotension   Aspiration into airway   H/O: CVA (cerebrovascular accident)   Hypoxia   AKI (acute kidney injury)    Time spent: 90min    Cloie Wooden  [facility] Pager [phone]. If 7PM-7AM, please contact night-coverage at [email or web address], password [facility] 08/19/2013, 10:07 AM  LOS: 3 days

## 2013-08-19 NOTE — Progress Notes (Signed)
Clinical Social Work Department BRIEF PSYCHOSOCIAL ASSESSMENT 08/19/2013  Patient:  Dorothy Collins, Dorothy Collins     Account Number:  [id number]     Admit date:  08/16/2013  Clinical Social Worker:  Rolinda Roan  Date/Time:  08/19/2013 05:21 PM  Referred by:  Physician  Date Referred:  08/19/2013 Referred for  SNF Placement   Other Referral:   Interview type:  Family Other interview type:    PSYCHOSOCIAL DATA Living Status:  FACILITY Admitted from facility:  [facility] Level of care:  Assisted Living Primary support name:  Dorothy Collins Primary support relationship to patient:  CHILD, ADULT Degree of support available:   Good support.    CURRENT CONCERNS  Other Concerns:    SOCIAL WORK ASSESSMENT / PLAN Clinical Social Worker (CSW) contacted patient's son Dorothy Collins to discuss D/C plan. Son reported that patient is from [facility] and he would like for her to go back to her old room on the Assisted Living side. Son reported that he would have to pay for a bed hold if she went to the SNF side. CSW explained that PT, MD, and Maple Pauline Aus will determine if she needs a higher level of care. CSW completed FL2 and sent updated clinicals to [facility].   Assessment/plan status:  Psychosocial Support/Ongoing Assessment of Needs Other assessment/ plan:   Information/referral to community resources:    PATIENT'S/FAMILY'S RESPONSE TO PLAN OF CARE: Son thanked CSW for calling and assisting with placement process.

## 2013-08-20 DIAGNOSIS — F329 Major depressive disorder, single episode, unspecified: Secondary | ICD-10-CM

## 2013-08-20 DIAGNOSIS — F3289 Other specified depressive episodes: Secondary | ICD-10-CM

## 2013-08-20 MED ORDER — GABAPENTIN 400 MG PO CAPS: 400.0000 mg | ORAL_CAPSULE | Freq: Three times a day (TID) | ORAL | Status: DC

## 2013-08-20 MED ORDER — LEVOFLOXACIN 500 MG PO TABS: 500.0000 mg | ORAL_TABLET | Freq: Every day | ORAL | Status: DC

## 2013-08-20 NOTE — Care Management Note (Unsigned)
    Page 1 of 1   08/20/2013     2:30:35 PM CARE MANAGEMENT NOTE 08/20/2013  Patient:  Dorothy Collins, Dorothy Collins   Account Number:  [id number]  Date Initiated:  08/17/2013  Documentation initiated by:  Marvetta Gibbons  Subjective/Objective Assessment:   Pt admitted with hypotension and sepsis     Action/Plan:   PTA pt was at [place] following for d/c need   Anticipated DC Date:  08/20/2013   Anticipated DC Plan:  ASSISTED LIVING / [place] referral  Clinical Social Worker      DC Planning Services  CM consult      Choice offered to / List presented to:             Status of service:  In process, will continue to follow Medicare Important Message given?  YES (If response is "NO", the following Medicare IM given date fields will be blank) Date Medicare IM given:   Date Additional Medicare IM given:  08/20/2013  Discharge Disposition:    Per UR Regulation:  Reviewed for med. necessity/level of care/duration of stay  If discussed at [place] of Stay Meetings, dates discussed:    Comments:  08/20/13 [place], MSN, CM- Met with patient to provide Medicare IM letter.

## 2013-08-20 NOTE — Discharge Summary (Signed)
Physician Discharge Summary  Dorothy Collins GHW:[id number] DOB: October 14, 1930 DOA: 08/16/2013  PCP: Nadeen Landau., MD  Admit date: 08/16/2013 Discharge date: 08/20/2013  Time spent: 45 minutes  Recommendations for Outpatient Follow-up:  1. PCP in 1 week 2. Please consider initiating Palliative care discussions with patient and family  Discharge Diagnoses:  Principal Problem:   Sepsis   Aspiration pneumonia   Chronic back pain   Vascular dementia without behavioral disturbance   Hypotension   Aspiration into airway   H/O: CVA (cerebrovascular accident)   Hypoxia   AKI (acute kidney injury)   COPD  Discharge Condition: stable  Diet recommendation: D2 diet with thin liquids  Filed Weights   08/16/13 1903  Weight: 72.4 kg (159 lb 9.8 oz)    History of present illness:  Chief Complaint: Unresponsiveness  HPI: Dorothy Collins is a 80 y.o. female from [place] skilled nursing facility with past history of dementia, history of CVA, COPD, chronic back pain, anxiety, bedbound at baseline was brought to the ER today after being found unresponsive and hypoxic in her nursing home this a.m.  Details regarding the event unknown, when EMS found her she had food in her mouth and one of her dentures stuck in the back of her mouth.  Arrival to the emergency room she was hypoxic with O2 sat of 80%, temp of 101, blood pressure of 83/50 and unresponsive, EDP contacted family who recommended DNR/DNI, no central line no pressors.  Subsequently she has improved some in terms of mentation with IV fluids and antibiotics.  Patient currently at this time denies any acute complaints  Hospital Course:  Sepsis/Aspiration induced -initially on admission was hypoxic, hypotensive and unresponsive, improved with IVF, ABx -Repeat chest x-ray 6/5: with R base atx: likely pneumonia  -UA unremarkable  -s/p Day 3 of Vancomycin and cefepime, changed to Po levaquin 6/6, PCN allergy , continue for 3 more days to  complete 7 day course -Blood cultures negative so far  -completed ST eval: D2 diet with thin liquids recommended, per ST she is a moderate risk of aspiration  Hypotension  -due to 1  -resolved   HTN:  -resumed clonidine   Vascular dementia without behavioral disturbance  -continue namenda, risperidone   H/O: CVA (cerebrovascular accident)  -continue aggrenox  COPD  -stable, nebs PRN   Depression/anxiety  -continue cymbalta, klonopin at lower dose   Chronic back pain  -continue oxycodone PRN   Hypokalemia  -replaced    Discharge Exam: Filed Vitals:   08/20/13 1100  BP:   Pulse: 105  Temp:   Resp:     General: Alert, awake, oriented to self and place Cardiovascular: S1S2/RRR Respiratory: Ronchi at R base imporved  Discharge Instructions You were cared for by a hospitalist during your hospital stay. If you have any questions about your discharge medications or the care you received while you were in the hospital after you are discharged, you can call the unit and asked to speak with the hospitalist on call if the hospitalist that took care of you is not available. Once you are discharged, your primary care physician will handle any further medical issues. Please note that NO REFILLS for any discharge medications will be authorized once you are discharged, as it is imperative that you return to your primary care physician (or establish a relationship with a primary care physician if you do not have one) for your aftercare needs so that they can reassess your need for medications and monitor  your lab values.  Discharge Instructions   Discharge instructions    Complete by:  As directed   Dysphagia 2 diet with thin liquids     Increase activity slowly    Complete by:  As directed             Medication List    STOP taking these medications       gabapentin 800 MG tablet  Commonly known as:  NEURONTIN  Replaced by:  gabapentin 400 MG capsule     tiZANidine 2  MG tablet  Commonly known as:  ZANAFLEX      TAKE these medications       antiseptic oral rinse Liqd  by Mouth Rinse route as directed. Use twice daily as directed     azelastine 0.1 % nasal spray  Commonly known as:  ASTELIN  Place 1 spray into both nostrils 2 (two) times daily. Use in each nostril as directed for uncontrolled rhinorrhea.     BESIVANCE 0.6 % Susp  Generic drug:  Besifloxacin HCl  Place 1 drop into the left eye 2 (two) times daily. Instill 1 drop into left eye twice daily on 5/23, 5/24 and morning of surgery then instill 1 drop into left eye twice daily for 1 week. Wait between 2 eye meds     bimatoprost 0.01 % Soln  Commonly known as:  LUMIGAN  Place 1 drop into both eyes daily.     calcium-vitamin D 500-200 MG-UNIT per tablet  Commonly known as:  OSCAL WITH D  Take 1 tablet by mouth 2 (two) times daily.     Cholecalciferol 2000 UNITS Caps  Take 2,000 Units by mouth daily.     cholecalciferol 400 UNITS Tabs tablet  Commonly known as:  VITAMIN D  Take 400 Units by mouth 2 (two) times daily.     clonazePAM 0.5 MG tablet  Commonly known as:  KLONOPIN  Take 0.5 mg by mouth 3 (three) times daily as needed for anxiety.     cloNIDine 0.1 MG tablet  Commonly known as:  CATAPRES  Take 0.1 mg by mouth 2 (two) times daily as needed. For SBP> 180 & DBP > 95     cycloSPORINE 0.05 % ophthalmic emulsion  Commonly known as:  RESTASIS  Place 1 drop into both eyes 2 (two) times daily.     dipyridamole-aspirin 200-25 MG per 12 hr capsule  Commonly known as:  AGGRENOX  Take 1 capsule by mouth 2 (two) times daily.     DULoxetine 60 MG capsule  Commonly known as:  CYMBALTA  Take 90 mg by mouth daily. Takes with 30mg  for total of 90mg      DULoxetine 30 MG capsule  Commonly known as:  CYMBALTA  Take 30 mg by mouth daily. Takes with 60 mg for total of 90mg      fexofenadine 180 MG tablet  Commonly known as:  ALLEGRA  Take 180 mg by mouth daily.     gabapentin 400  MG capsule  Commonly known as:  NEURONTIN  Take 1 capsule (400 mg total) by mouth 3 (three) times daily.     ketotifen 0.025 % ophthalmic solution  Commonly known as:  ZADITOR  Place 1 drop into both eyes 2 (two) times daily. Wait 3-5 minutes between eye meds     levofloxacin 500 MG tablet  Commonly known as:  LEVAQUIN  Take 1 tablet (500 mg total) by mouth daily. For 2 more days     lidocaine  5 %  Commonly known as:  LIDODERM  Place 1 patch onto the skin daily. Apply to back  -  Remove & Discard patch within 12 hours or as directed by MD     losartan-hydrochlorothiazide 100-25 MG per tablet  Commonly known as:  HYZAAR  Take 1 tablet by mouth daily.     loteprednol 0.5 % ophthalmic suspension  Commonly known as:  LOTEMAX  Place 1 drop into the left eye 4 (four) times daily.     meclizine 25 MG tablet  Commonly known as:  ANTIVERT  Take 25 mg by mouth every 6 (six) hours as needed for dizziness.     memantine 5 MG tablet  Commonly known as:  NAMENDA  Take 5 mg by mouth 2 (two) times daily.     omega-3 acid ethyl esters 1 G capsule  Commonly known as:  LOVAZA  Take 2 g by mouth 2 (two) times daily.     omeprazole 20 MG capsule  Commonly known as:  PRILOSEC  Take 40 mg by mouth 2 (two) times daily before a meal.     oxyCODONE 5 MG immediate release tablet  Commonly known as:  Oxy IR/ROXICODONE  Take 5 mg by mouth every 4 (four) hours as needed for severe pain.     risperiDONE 1 MG disintegrating tablet  Commonly known as:  RISPERDAL M-TABS  Take 1 mg by mouth 2 (two) times daily.     sennosides-docusate sodium 8.6-50 MG tablet  Commonly known as:  SENOKOT-S  Take 1 tablet by mouth daily.     SIMBRINZA 1-0.2 % Susp  Generic drug:  Brinzolamide-Brimonidine  Apply 1 drop to eye 2 (two) times daily.     SYSTANE OP  Apply 1 drop to eye every 2 (two) hours.     ZOLMitriptan 2.5 MG tablet  Commonly known as:  ZOMIG  Take 2.5 mg by mouth as needed. For migraine      zolpidem 5 MG tablet  Commonly known as:  AMBIEN  Take one tablet by mouth every night at bedtime as needed for sleep       Allergies  Allergen Reactions  . Lyrica [Pregabalin]     unk  . Morphine And Related     unk  . Penicillins     unk  . Sulfa Antibiotics     unk      The results of significant diagnostics from this hospitalization (including imaging, microbiology, ancillary and laboratory) are listed below for reference.    Significant Diagnostic Studies: Dg Chest 2 View  08/17/2013   CLINICAL DATA:  Fall.  EXAM: CHEST  2 VIEW  COMPARISON:  08/16/2013  FINDINGS: Heart is upper limits normal in size. Minimal right base atelectasis. Biapical scarring. No effusions. No acute bony abnormality.  IMPRESSION: Minimal right basilar atelectasis.  No acute findings.   Electronically Signed   By: Rolm Baptise M.D.   On: 08/17/2013 09:41   Dg Chest Port 1 View  08/16/2013   CLINICAL DATA:  Shortness of breath and hypotension  EXAM: PORTABLE CHEST - 1 VIEW  COMPARISON:  Portable chest x-ray of October 24, 2011  FINDINGS: The lungs are reasonably well inflated allowing for patient positioning. There is no focal infiltrate. The cardiac silhouette is mildly enlarged but stable. The pulmonary vascularity is not engorged. There is no pleural effusion or pneumothorax. The bony structures are unremarkable.  IMPRESSION: There is no active cardiopulmonary disease.   Electronically Signed   By:  David  Martinique   On: 08/16/2013 13:45    Microbiology: Recent Results (from the past 240 hour(s))  CULTURE, BLOOD (ROUTINE X 2)     Status: None   Collection Time    08/16/13  1:09 PM      Result Value Ref Range Status   Specimen Description BLOOD RIGHT ANTECUBITAL   Final   Special Requests BOTTLES DRAWN AEROBIC AND ANAEROBIC 5MLS   Final   Culture  Setup Time     Final   Value: 08/16/2013 17:56     Performed at [facility]   Culture     Final   Value:        BLOOD CULTURE RECEIVED NO GROWTH TO  DATE CULTURE WILL BE HELD FOR 5 DAYS BEFORE ISSUING A FINAL NEGATIVE REPORT     Performed at [facility]   Report Status PENDING   Incomplete  CULTURE, BLOOD (ROUTINE X 2)     Status: None   Collection Time    08/16/13  1:21 PM      Result Value Ref Range Status   Specimen Description BLOOD LEFT ANTECUBITAL   Final   Special Requests BOTTLES DRAWN AEROBIC AND ANAEROBIC 5MLS   Final   Culture  Setup Time     Final   Value: 08/16/2013 17:57     Performed at [facility]   Culture     Final   Value:        BLOOD CULTURE RECEIVED NO GROWTH TO DATE CULTURE WILL BE HELD FOR 5 DAYS BEFORE ISSUING A FINAL NEGATIVE REPORT     Performed at [facility]   Report Status PENDING   Incomplete  URINE CULTURE     Status: None   Collection Time    08/16/13  4:35 PM      Result Value Ref Range Status   Specimen Description URINE, CATHETERIZED   Final   Special Requests NONE   Final   Culture  Setup Time     Final   Value: 08/16/2013 22:20     Performed at [facility]     Final   Value: NO GROWTH     Performed at [facility]   Culture     Final   Value: NO GROWTH     Performed at [facility]   Report Status 08/17/2013 FINAL   Final  MRSA PCR SCREENING     Status: None   Collection Time    08/16/13  7:35 PM      Result Value Ref Range Status   MRSA by PCR NEGATIVE  NEGATIVE Final   Comment:            The GeneXpert MRSA Assay (FDA     approved for NASAL specimens     only), is one component of a     comprehensive MRSA colonization     surveillance program. It is not     intended to diagnose MRSA     infection nor to guide or     monitor treatment for     MRSA infections.     Labs: Basic Metabolic Panel:  Recent Labs Lab 08/16/13 1309 08/16/13 1338 08/17/13 0347 08/18/13 0500 08/19/13 0447  NA 137 138 143 140 142  K 3.9 3.6* 3.4* 2.8* 3.5*  CL 94* 91* 106 103 101  CO2 29  --  24 24 26   GLUCOSE 124* 125* 89 97  110*  BUN 55* 50* 34* 12 11  CREATININE 2.34* 2.60* 0.89 0.47* 0.54  CALCIUM 9.3  --  8.1* 8.6 9.0   Liver Function Tests:  Recent Labs Lab 08/16/13 1309 08/17/13 0347  AST 62* 82*  ALT 15 20  ALKPHOS 66 60  BILITOT 0.5 0.3  PROT 7.1 5.9*  ALBUMIN 2.8* 2.2*   No results found for this basename: LIPASE, AMYLASE,  in the last 168 hours No results found for this basename: AMMONIA,  in the last 168 hours CBC:  Recent Labs Lab 08/16/13 1309 08/16/13 1338 08/17/13 0347 08/18/13 0500 08/19/13 0447  WBC 9.6  --  10.9* 8.4 9.0  NEUTROABS 6.8  --   --   --   --   HGB 12.9 13.9 12.3 11.7* 12.4  HCT 37.3 41.0 38.6 35.6* 37.6  MCV 90.1  --  93.0 90.4 88.3  PLT 220  --  170 177 249   Cardiac Enzymes: No results found for this basename: CKTOTAL, CKMB, CKMBINDEX, TROPONINI,  in the last 168 hours BNP: BNP (last 3 results) No results found for this basename: PROBNP,  in the last 8760 hours CBG: No results found for this basename: GLUCAP,  in the last 168 hours     Signed:  [facility] 08/20/2013, 1:09 PM

## 2013-08-20 NOTE — Progress Notes (Signed)
Called report to [facility] RN.  Kizzie Bane, RN

## 2013-08-20 NOTE — Progress Notes (Signed)
Speech Language Pathology Treatment: Dysphagia  Patient Details Name: Dorothy Collins MRN: [id number] DOB: 1930-11-09 Today's Date: 08/20/2013 Time: 0712-1975 SLP Time Calculation (min): 10 min  Assessment / Plan / Recommendation Clinical Impression  Pt consumed Dys 2 textures and thin liquids with Min cues for use of safe swallowing strategies and for encouragement to attempt PO without dentures in place (pt reports that she does not wear them, but requests them during meal). SLP reviewed recommendations for NOT wearing bottom dentition as they are ill-fitting and were found to further impede mastication. Despite prolonged mastication likely due to missing dentition, pt appears within gross functional limits during intake this afternoon.   HPI HPI: Dorothy Collins is a 80 y.o. female from [place] skilled nursing facility with past history of dementia, history of CVA, COPD, chronic back pain, anxiety, bedbound at baseline was brought to the ER today after being found unresponsive and hypoxic in her nursing home on 6/4. When EMS found her she had food in her mouth and one of her dentures stuck in the back of her mouth. Pt has a history of CVA, had several MBS in the past with the last in 2010 showing flash penetration of thin liquids, recommended Regular diet/thin liquids.   Pertinent Vitals N/A  SLP Plan  Continue with current plan of care    Recommendations Diet recommendations: Dysphagia 2 (fine chop);Thin liquid Liquids provided via: Cup;Straw Medication Administration: Whole meds with puree Supervision: Staff to assist with self feeding;Full supervision/cueing for compensatory strategies Compensations: Slow rate;Small sips/bites Postural Changes and/or Swallow Maneuvers: Seated upright 90 degrees              Oral Care Recommendations: Oral care BID Follow up Recommendations: Skilled Nursing facility Plan: Continue with current plan of care    GO      Germain Osgood, M.A.  CCC-SLP [phone]  Germain Osgood 08/20/2013, 1:55 PM

## 2013-08-20 NOTE — Progress Notes (Signed)
OT Cancellation Note  Patient Details Name: Dorothy Collins MRN: [id number] DOB: [birth year]   Cancelled Treatment:    Reason Eval/Treat Not Completed: Other (comment) Attempted to see pt for OT eval. Pt declined to get up with OT for ADL and didn't want to transfer back to bed yet. Pt states she feels nauseous right now also. Nursing in with pt shortly after OT left room. Will reattempt later time.  Dorothy Collins Dorothy Collins [phone] 08/20/2013, 11:56 AM

## 2013-08-20 NOTE — Evaluation (Signed)
Physical Therapy Evaluation Patient Details Name: Dorothy Collins MRN: [id number] DOB: [birth year] Today's Date: 08/20/2013   History of Present Illness  80 y.o. female admitted to [facility] on 08/16/13 with O2 sat of 80%, temp of 101, blood pressure of 83/50 and unresponsive.  Pt is from [facility].  She has significant PMhx of dementia, CVA, COPD, CA (s/p breast surgery), R TKA,  HTN, anxiety, femur fx surgery (not sure which side).   Clinical Impression  Pt's mobility is weaker than her baseline of transferring OOB to WC at mod I level, feeding herself and dressing herself.  Some of her mobility today was limited by her cognition (easily distracted, needed cue to re-focus to task) and reports of abdominal pain).  Pt reported fear of falling and being weak on her feet with stand pivot to the recliner chair.  SNF placement at this time is necessary as the pt is no longer at a mod I level of transfers that she needs to be to safely return to ALF.   PT to follow acutely for deficits listed below.       Follow Up Recommendations SNF    Equipment Recommendations  None recommended by PT    Recommendations for Other Services   NA    Precautions / Restrictions Precautions Precautions: Fall Precaution Comments: pt fearful of falling.  Restrictions Weight Bearing Restrictions: No      Mobility  Bed Mobility Overal bed mobility: Modified Independent             General bed mobility comments: Needs significat extra time to get EOB.  Pt refusing to let PT help her in any way.  Very slow on compliant surfaces and very easily distracted.  Needs cues to redirect to task at hand.   Transfers Overall transfer level: Needs assistance Equipment used: None Transfers: Sit to/from [facility] Sit to Stand: Min assist Stand pivot transfers: Min assist       General transfer comment: Min assist to support trunk for safety over weak legs during transition to stand.  Also, min assist to  support trunk while turning to recliner chair (positioned at 45 degree angle to pt -per daughter''s request).  Pt using bil hands on armrests of chair to pivot, but sat prematurely and therapist had to position knee under her buttocks to keep her from sliding out of the chair.  Pt was able to reposition with min assist.    Ambulation/Gait             General Gait Details: Pt non- ambulatory PTA         Balance Overall balance assessment: Needs assistance Sitting-balance support: Feet unsupported;No upper extremity supported Sitting balance-Leahy Scale: Good     Standing balance support: Bilateral upper extremity supported Standing balance-Leahy Scale: Poor                               Pertinent Vitals/Pain O2 sats on RA are 94% or higher with no signs of DOE.  HR 96-108 bpm during session .  Left pt on RA at end of session and RN made aware.      Home Living Family/patient expects to be discharged to:: Assisted living [facility])                 Additional Comments: Per daughter, the plan is to go SNF and then back to ALF as soon as possible.  Prior Function Level of Independence: Independent with assistive device(s)         Comments: Per daughter, pt was independent with transfers into and out of her bed to [facility] (manual) or electric scooter.  She fed herself and was able to dress herself.  She did not walk.      Hand Dominance   Dominant Hand: Right    Extremity/Trunk Assessment   Upper Extremity Assessment: Defer to OT evaluation           Lower Extremity Assessment: RLE deficits/detail;LLE deficits/detail RLE Deficits / Details: h/o R TKA.  Pt able to lift leg against gravity in bed, due to cognition could not get her to do much in the way of MMT.  In standing pt self report bil leg weakness and demonstrated muscle quivering and flexed knees in standing wtih heavy reliance on upper extremities for support during transfer.   LLE  Deficits / Details: h/o R TKA.  Pt able to lift leg against gravity in bed, due to cognition could not get her to do much in the way of MMT.  In standing pt self report bil leg weakness and demonstrated muscle quivering and flexed knees in standing wtih heavy reliance on upper extremities for support during transfer.    Cervical / Trunk Assessment: Normal;Other exceptions  Communication   Communication: No difficulties  Cognition Arousal/Alertness: Awake/alert Behavior During Therapy: WFL for tasks assessed/performed Overall Cognitive Status: History of cognitive impairments - at baseline Area of Impairment: Orientation;Attention;Memory;Following commands;Problem solving Orientation Level: Disoriented to;Time;Situation Current Attention Level: Focused (very easily distracted) Memory: Decreased short-term memory Following Commands: Follows one step commands consistently     Problem Solving: Decreased initiation;Requires verbal cues      General Comments General comments (skin integrity, edema, etc.): Pt c/o abdominal pain and cramping throughout session.  Per daughter she has h/o GERD.  Left O2 off throughout session and O2 sats were 94% or higher throughout with no signs of DOE.  Left off and informed RN.  HR stable upper 90s-low 100s.            Assessment/Plan    PT Assessment Patient needs continued PT services  PT Diagnosis Difficulty walking;Generalized weakness;Abnormality of gait;Acute pain;Altered mental status   PT Problem List Decreased strength;Decreased balance;Decreased activity tolerance;Decreased mobility;Decreased coordination;Decreased cognition;Pain  PT Treatment Interventions Functional mobility training;Therapeutic activities;Therapeutic exercise;Balance training;Neuromuscular re-education;Cognitive remediation;Patient/family education;Modalities   PT Goals (Current goals can be found in the Care Plan section) Acute Rehab PT Goals Patient Stated Goal: to get back  to her ALF apartment PT Goal Formulation: With patient/family Time For Goal Achievement: 09/03/13 Potential to Achieve Goals: Good    Frequency Min 2X/week    End of Session   Activity Tolerance: Patient limited by fatigue;Patient limited by pain Patient left: in chair;with call bell/phone within reach;with family/visitor present           Time: 1008-1106 PT Time Calculation (min): 58 min   Charges:   PT Evaluation $Initial PT Evaluation Tier I: 1 Procedure PT Treatments $Therapeutic Activity: 38-52 mins        Jayesh Marbach B. [place], [place], DPT [phone]   08/20/2013, 11:29 AM

## 2013-08-20 NOTE — Clinical Social Work Note (Signed)
PT recommending SNF and I have secured a bed for her at [facility] in the SNF unit- she has resided there (currently in ALF) for over 10 years. SNF [facility] # rec'd for SNF- family agreeable to plans- plan transfer via EMS.   Eduard Clos, MSW, [place]

## 2013-08-21 ENCOUNTER — Non-Acute Institutional Stay (SKILLED_NURSING_FACILITY): Payer: Medicare Other | Admitting: Internal Medicine

## 2013-08-21 DIAGNOSIS — I6990 Unspecified sequelae of unspecified cerebrovascular disease: Secondary | ICD-10-CM

## 2013-08-21 DIAGNOSIS — J189 Pneumonia, unspecified organism: Secondary | ICD-10-CM

## 2013-08-21 DIAGNOSIS — J441 Chronic obstructive pulmonary disease with (acute) exacerbation: Secondary | ICD-10-CM

## 2013-08-21 NOTE — Progress Notes (Signed)
Patient ID: Dorothy Collins, female   DOB: [birth year], 80 y.o.   MRN: [id number] Facility; [facility] Chief complaint; readmission to the facility postdate [place] from 6/4-08/20/2013 History; this is an 80 year old woman who has been in this facility for a prolonged period. Most recently she was on the assisted-living side. She has a history of multiple CVAs with multi-infarct dementia. She has had that behavioral difficulty. She was hospitalized on this occasion after being found unresponsive and hypoxemic in her nursing home. There was some suggestion that she had food and one of her dentures stuck in the back of her her mouth. On arrival to the hospital she had an O2 sat of 80% temperature 101 and a blood pressure of 83/50 and was unresponsive. She was confirmed to be a DO NOT RESUSCITATE without central line her pressors have. She was given IV fluid and antibiotics. Chest x-ray showed the right base likely pneumonia. A urinalysis was unremarkable. She was given 3 days of vancomycin and cefepime and then changed to by mouth Levaquin. This will complete a seven-day course. Her blood cultures were negative. Urine cultures were negative.  Past Medical History  Diagnosis Date  . Dementia   . CVA (cerebral vascular accident)   . COPD (chronic obstructive pulmonary disease)     Emphysema  . GERD (gastroesophageal reflux disease)   . Glaucoma   . Hyperlipidemia   . Insomnia   . Rhinitis 12/14/1999  . Depression 10/12/2009  . Cancer 07/18/2008    Breast cancer-Left  . Hypertension   . Arthritis 07/18/2008    osteoporosis  . Peripheral neuropathy 07/18/2008  . Cerebrovascular disease 08/05/2003    acute  . Lumbago 08/05/2003  . Edema 08/05/2003  . Cellulitis and abscess of leg 08/05/2003  . Peripheral vascular disease 08/05/2003  . Anxiety 08/05/2003  . Hypopotassemia 08/05/2003  . Anemia    Past Surgical History  Procedure Laterality Date  . Carotid endarterectomy  2011    Left CEA  . Eye surgery   07/18/2008    cataract  . Fracture surgery  07/18/2008    closed  part neck femur  . Joint replacement      rt knee  . Breast surgery      rt   Discharge medications include Lumigan 0.01% one drop into both eyes daily Os-Cal 500/200 one tablet twice a day Vitamin D 3 2000 units daily Clonidine 0.1 twice a day restasis one drop twice a day Aggrenox 1 capsule twice a day Cymbalta 60 pulse 30 equal 90 mg daily Allegra 180 daily Neurontin 400 3 times a day Zaditor one drop both eyes twice a day Levaquin 500 daily for 2 more days Lidoderm apply to back daily Hyzaar 100/25 one tablet daily Lotemax ophthalmic one drop into left eye 4 times daily Antivert 25 mg every 6 hours when necessary dizziness Namenda 5 mg twice a day Lovaza 2 g by mouth 2 times daily Prilosec 40 twice a day Oxycodone 5 mg every 4 hours when necessary pain Risperdal M. tabs 1 mg by mouth 2 times daily Simbrinza 0.2% one drop into eye 2 times daily Systane 1 drop in the eye every 2 hours Zomig 2.5 daily as needed for migraine Ambien 5 mg each bedtime when necessary  Social patient does not have any advanced directives on the chart  Review of systems Respiratory no cough no sputum no shortness of breath Cardiac no clear chest pain GI no abdominal pain GU no dysuria  Physical  examination Gen. the patient is lying in bed. She does not look to be in any distress Vitals O2 sat 95% on room air pulse 93 respirations 18 and unlabored Respiratory; shallow air entry but otherwise no crackles or wheezes, her work of breathing is normal there is no accessory muscle use. Cardiac heart sounds are normal there is no murmurs Abdomen no liver no spleen no tenderness GU bladder is not enlarged there is no CVA tenderness. Extremities; some degree of venous stasis with mild edema left greater than right. She probably also has significant PAD her lower legs and feet are cool. Dorsalis pedis and posterior tibial pulses are not  felt.  Impression/plan #1 sepsis felt to be secondary to pneumonia. The patient was treated with the antibiotics and is completing Levaquin for 7 days. Her cultures seem to be negative for. This was felt to be possibly aspiration. She saw speech therapy and she is on a dysphagia 2 diet with thin liquids. #2 hypotension felt secondary to #1. Now resuming clonidine. #3 history of vascular dementia with behavioral disturbances. She is on risperidone and Namenda. I don't actually see the psychiatry followup she has had I know that she is occasionally been episodically psychotic. #4 COPD this seems stable #5 depression and anxiety continue Cymbalta.  The patient seems fairly stable at the moment. Her COPD is definitely present but seems stable

## 2013-08-22 LAB — CULTURE, BLOOD (ROUTINE X 2)
CULTURE: NO GROWTH
Culture: NO GROWTH

## 2013-08-29 ENCOUNTER — Non-Acute Institutional Stay (SKILLED_NURSING_FACILITY): Payer: Medicare Other | Admitting: Internal Medicine

## 2013-08-29 DIAGNOSIS — L8990 Pressure ulcer of unspecified site, unspecified stage: Secondary | ICD-10-CM

## 2013-08-29 DIAGNOSIS — L89309 Pressure ulcer of unspecified buttock, unspecified stage: Secondary | ICD-10-CM

## 2013-08-29 DIAGNOSIS — L8992 Pressure ulcer of unspecified site, stage 2: Secondary | ICD-10-CM

## 2013-09-04 ENCOUNTER — Other Ambulatory Visit: Payer: Self-pay | Admitting: *Deleted

## 2013-09-04 MED ORDER — CLONAZEPAM 1 MG PO TABS
ORAL_TABLET | ORAL | Status: DC
Start: 2013-09-04 — End: 2013-09-08

## 2013-09-04 NOTE — Telephone Encounter (Signed)
[facility] 

## 2013-09-05 ENCOUNTER — Emergency Department (HOSPITAL_COMMUNITY): Payer: Medicare Other

## 2013-09-05 ENCOUNTER — Observation Stay (HOSPITAL_COMMUNITY): Payer: Medicare Other

## 2013-09-05 ENCOUNTER — Encounter (HOSPITAL_COMMUNITY): Payer: Self-pay | Admitting: Emergency Medicine

## 2013-09-05 ENCOUNTER — Inpatient Hospital Stay (HOSPITAL_COMMUNITY)
Admission: EM | Admit: 2013-09-05 | Discharge: 2013-09-08 | DRG: 316 | Disposition: A | Discharge status: Skilled Nursing Facility | Payer: Medicare Other | Attending: Internal Medicine | Admitting: Internal Medicine

## 2013-09-05 DIAGNOSIS — I951 Orthostatic hypotension: Secondary | ICD-10-CM

## 2013-09-05 DIAGNOSIS — E860 Dehydration: Secondary | ICD-10-CM | POA: Diagnosis present

## 2013-09-05 DIAGNOSIS — I6990 Unspecified sequelae of unspecified cerebrovascular disease: Secondary | ICD-10-CM

## 2013-09-05 DIAGNOSIS — J449 Chronic obstructive pulmonary disease, unspecified: Secondary | ICD-10-CM | POA: Diagnosis present

## 2013-09-05 DIAGNOSIS — Z87891 Personal history of nicotine dependence: Secondary | ICD-10-CM

## 2013-09-05 DIAGNOSIS — A088 Other specified intestinal infections: Secondary | ICD-10-CM | POA: Diagnosis present

## 2013-09-05 DIAGNOSIS — Z8673 Personal history of transient ischemic attack (TIA), and cerebral infarction without residual deficits: Secondary | ICD-10-CM

## 2013-09-05 DIAGNOSIS — H409 Unspecified glaucoma: Secondary | ICD-10-CM | POA: Diagnosis present

## 2013-09-05 DIAGNOSIS — G8929 Other chronic pain: Secondary | ICD-10-CM | POA: Diagnosis present

## 2013-09-05 DIAGNOSIS — Z7401 Bed confinement status: Secondary | ICD-10-CM

## 2013-09-05 DIAGNOSIS — I4891 Unspecified atrial fibrillation: Secondary | ICD-10-CM | POA: Diagnosis present

## 2013-09-05 DIAGNOSIS — F3289 Other specified depressive episodes: Secondary | ICD-10-CM | POA: Diagnosis present

## 2013-09-05 DIAGNOSIS — I672 Cerebral atherosclerosis: Secondary | ICD-10-CM | POA: Diagnosis present

## 2013-09-05 DIAGNOSIS — Z9849 Cataract extraction status, unspecified eye: Secondary | ICD-10-CM

## 2013-09-05 DIAGNOSIS — Z853 Personal history of malignant neoplasm of breast: Secondary | ICD-10-CM

## 2013-09-05 DIAGNOSIS — G629 Polyneuropathy, unspecified: Secondary | ICD-10-CM

## 2013-09-05 DIAGNOSIS — Z96659 Presence of unspecified artificial knee joint: Secondary | ICD-10-CM

## 2013-09-05 DIAGNOSIS — J4489 Other specified chronic obstructive pulmonary disease: Secondary | ICD-10-CM | POA: Diagnosis present

## 2013-09-05 DIAGNOSIS — F0150 Vascular dementia without behavioral disturbance: Secondary | ICD-10-CM | POA: Diagnosis present

## 2013-09-05 DIAGNOSIS — R112 Nausea with vomiting, unspecified: Secondary | ICD-10-CM | POA: Diagnosis present

## 2013-09-05 DIAGNOSIS — I6529 Occlusion and stenosis of unspecified carotid artery: Secondary | ICD-10-CM

## 2013-09-05 DIAGNOSIS — E785 Hyperlipidemia, unspecified: Secondary | ICD-10-CM | POA: Diagnosis present

## 2013-09-05 DIAGNOSIS — R0902 Hypoxemia: Secondary | ICD-10-CM

## 2013-09-05 DIAGNOSIS — M549 Dorsalgia, unspecified: Secondary | ICD-10-CM

## 2013-09-05 DIAGNOSIS — D72829 Elevated white blood cell count, unspecified: Secondary | ICD-10-CM | POA: Diagnosis present

## 2013-09-05 DIAGNOSIS — M810 Age-related osteoporosis without current pathological fracture: Secondary | ICD-10-CM | POA: Diagnosis present

## 2013-09-05 DIAGNOSIS — F209 Schizophrenia, unspecified: Secondary | ICD-10-CM | POA: Diagnosis present

## 2013-09-05 DIAGNOSIS — F329 Major depressive disorder, single episode, unspecified: Secondary | ICD-10-CM | POA: Diagnosis present

## 2013-09-05 DIAGNOSIS — I150 Renovascular hypertension: Secondary | ICD-10-CM

## 2013-09-05 DIAGNOSIS — Z79899 Other long term (current) drug therapy: Secondary | ICD-10-CM

## 2013-09-05 DIAGNOSIS — K219 Gastro-esophageal reflux disease without esophagitis: Secondary | ICD-10-CM | POA: Diagnosis present

## 2013-09-05 DIAGNOSIS — F32A Depression, unspecified: Secondary | ICD-10-CM

## 2013-09-05 DIAGNOSIS — I482 Chronic atrial fibrillation, unspecified: Secondary | ICD-10-CM

## 2013-09-05 DIAGNOSIS — G4700 Insomnia, unspecified: Secondary | ICD-10-CM

## 2013-09-05 DIAGNOSIS — N179 Acute kidney failure, unspecified: Secondary | ICD-10-CM

## 2013-09-05 DIAGNOSIS — G609 Hereditary and idiopathic neuropathy, unspecified: Secondary | ICD-10-CM | POA: Diagnosis present

## 2013-09-05 DIAGNOSIS — I959 Hypotension, unspecified: Principal | ICD-10-CM | POA: Diagnosis present

## 2013-09-05 DIAGNOSIS — Z66 Do not resuscitate: Secondary | ICD-10-CM | POA: Diagnosis present

## 2013-09-05 DIAGNOSIS — F411 Generalized anxiety disorder: Secondary | ICD-10-CM | POA: Diagnosis present

## 2013-09-05 DIAGNOSIS — I739 Peripheral vascular disease, unspecified: Secondary | ICD-10-CM | POA: Diagnosis present

## 2013-09-05 DIAGNOSIS — E876 Hypokalemia: Secondary | ICD-10-CM | POA: Diagnosis present

## 2013-09-05 DIAGNOSIS — R1110 Vomiting, unspecified: Secondary | ICD-10-CM | POA: Diagnosis present

## 2013-09-05 LAB — TSH: TSH: 1.96 u[IU]/mL (ref 0.350–4.500)

## 2013-09-05 LAB — BASIC METABOLIC PANEL
BUN: 24 mg/dL — AB (ref 6–23)
CO2: 24 mEq/L (ref 19–32)
Calcium: 10 mg/dL (ref 8.4–10.5)
Chloride: 94 mEq/L — ABNORMAL LOW (ref 96–112)
Creatinine, Ser: 0.72 mg/dL (ref 0.50–1.10)
GFR calc Af Amer: >90 mL/min (ref >90)
GFR calc non Af Amer: 78 mL/min — ABNORMAL LOW (ref >90)
GLUCOSE: 139 mg/dL — AB (ref 70–99)
Potassium: 3.5 mEq/L — ABNORMAL LOW (ref 3.7–5.3)
Sodium: 136 mEq/L — ABNORMAL LOW (ref 137–147)

## 2013-09-05 LAB — CBC
HCT: 40.9 % (ref 36.0–46.0)
HCT: 38 % (ref 36.0–46.0)
HEMOGLOBIN: 13.9 g/dL (ref 12.0–15.0)
Hemoglobin: 12.8 g/dL (ref 12.0–15.0)
MCH: 29.5 pg (ref 26.0–34.0)
MCH: 29.6 pg (ref 26.0–34.0)
MCHC: 34 g/dL (ref 30.0–36.0)
MCHC: 33.7 g/dL (ref 30.0–36.0)
MCV: 86.8 fL (ref 78.0–100.0)
MCV: 88 fL (ref 78.0–100.0)
PLATELETS: 321 10*3/uL (ref 150–400)
Platelets: 346 10*3/uL (ref 150–400)
RBC: 4.71 MIL/uL (ref 3.87–5.11)
RBC: 4.32 MIL/uL (ref 3.87–5.11)
RDW: 13.5 % (ref 11.5–15.5)
RDW: 13.6 % (ref 11.5–15.5)
WBC: 11.8 10*3/uL — ABNORMAL HIGH (ref 4.0–10.5)
WBC: 10.9 10*3/uL — ABNORMAL HIGH (ref 4.0–10.5)

## 2013-09-05 LAB — MAGNESIUM: Magnesium: 1.8 mg/dL (ref 1.5–2.5)

## 2013-09-05 LAB — PRO B NATRIURETIC PEPTIDE: Pro B Natriuretic peptide (BNP): 1509 pg/mL — ABNORMAL HIGH (ref 0–450)

## 2013-09-05 LAB — I-STAT TROPONIN, ED: TROPONIN I, POC: 0.04 ng/mL (ref 0.00–0.08)

## 2013-09-05 LAB — CREATININE, SERUM
CREATININE: 0.83 mg/dL (ref 0.50–1.10)
GFR calc Af Amer: 75 mL/min — ABNORMAL LOW (ref >90)
GFR, EST NON AFRICAN AMERICAN: 64 mL/min — AB (ref >90)

## 2013-09-05 LAB — LIPASE, BLOOD: Lipase: 29 U/L (ref 11–59)

## 2013-09-05 LAB — TROPONIN I: Troponin I: <0.3 ng/mL (ref <0.30)

## 2013-09-05 MED ORDER — RISPERIDONE 1 MG PO TBDP
1.0000 mg | ORAL_TABLET | Freq: Two times a day (BID) | ORAL | Status: DC
  Administered 2013-09-05 – 2013-09-06 (×2): 1 mg via ORAL
  Filled 2013-09-05 (×3): qty 1

## 2013-09-05 MED ORDER — DILTIAZEM HCL 25 MG/5ML IV SOLN: 10.0000 mg | Freq: Once | INTRAVENOUS | Status: DC

## 2013-09-05 MED ORDER — ONDANSETRON HCL 4 MG PO TABS
4.0000 mg | ORAL_TABLET | Freq: Four times a day (QID) | ORAL | Status: DC
  Administered 2013-09-06 – 2013-09-08 (×6): 4 mg via ORAL
  Filled 2013-09-05 (×14): qty 1

## 2013-09-05 MED ORDER — PANTOPRAZOLE SODIUM 40 MG PO TBEC
40.0000 mg | DELAYED_RELEASE_TABLET | Freq: Every day | ORAL | Status: DC
  Administered 2013-09-06 – 2013-09-07 (×2): 40 mg via ORAL
  Filled 2013-09-05 (×2): qty 1

## 2013-09-05 MED ORDER — CLONAZEPAM 0.5 MG PO TABS
0.5000 mg | ORAL_TABLET | Freq: Three times a day (TID) | ORAL | Status: DC | PRN
  Administered 2013-09-07: 0.5 mg via ORAL
  Filled 2013-09-05: qty 1

## 2013-09-05 MED ORDER — SODIUM CHLORIDE 0.9 % IJ SOLN
3.0000 mL | Freq: Two times a day (BID) | INTRAMUSCULAR | Status: DC
  Administered 2013-09-05 – 2013-09-07 (×3): 3 mL via INTRAVENOUS

## 2013-09-05 MED ORDER — ZOLPIDEM TARTRATE 5 MG PO TABS
5.0000 mg | ORAL_TABLET | Freq: Every evening | ORAL | Status: DC | PRN
  Administered 2013-09-05: 5 mg via ORAL
  Filled 2013-09-05: qty 1

## 2013-09-05 MED ORDER — GABAPENTIN 400 MG PO CAPS
400.0000 mg | ORAL_CAPSULE | Freq: Three times a day (TID) | ORAL | Status: DC
  Administered 2013-09-05: 400 mg via ORAL
  Filled 2013-09-05 (×4): qty 1

## 2013-09-05 MED ORDER — OMEGA-3-ACID ETHYL ESTERS 1 G PO CAPS
2.0000 g | ORAL_CAPSULE | Freq: Two times a day (BID) | ORAL | Status: DC
  Administered 2013-09-06 – 2013-09-07 (×3): 2 g via ORAL
  Filled 2013-09-05 (×7): qty 2

## 2013-09-05 MED ORDER — HEPARIN SODIUM (PORCINE) 5000 UNIT/ML IJ SOLN
5000.0000 [IU] | Freq: Three times a day (TID) | INTRAMUSCULAR | Status: DC
  Administered 2013-09-07 – 2013-09-08 (×3): 5000 [IU] via SUBCUTANEOUS
  Filled 2013-09-05 (×11): qty 1

## 2013-09-05 MED ORDER — ONDANSETRON HCL 4 MG/2ML IJ SOLN
4.0000 mg | Freq: Four times a day (QID) | INTRAMUSCULAR | Status: DC
  Administered 2013-09-05 – 2013-09-06 (×2): 4 mg via INTRAVENOUS
  Filled 2013-09-05 (×2): qty 2

## 2013-09-05 MED ORDER — SODIUM CHLORIDE 0.9 % IV BOLUS (SEPSIS)
250.0000 mL | Freq: Once | INTRAVENOUS | Status: AC
  Administered 2013-09-05: 250 mL via INTRAVENOUS

## 2013-09-05 MED ORDER — PANTOPRAZOLE SODIUM 40 MG PO TBEC: 40.0000 mg | DELAYED_RELEASE_TABLET | Freq: Every day | ORAL | Status: DC

## 2013-09-05 MED ORDER — SENNOSIDES-DOCUSATE SODIUM 8.6-50 MG PO TABS
1.0000 | ORAL_TABLET | Freq: Every day | ORAL | Status: DC
  Administered 2013-09-06: 1 via ORAL
  Filled 2013-09-05 (×3): qty 1

## 2013-09-05 MED ORDER — BIOTENE DRY MOUTH MT LIQD
15.0000 mL | Freq: Two times a day (BID) | OROMUCOSAL | Status: DC
  Administered 2013-09-06 – 2013-09-07 (×3): 15 mL via OROMUCOSAL

## 2013-09-05 MED ORDER — AZELASTINE HCL 0.1 % NA SOLN
1.0000 | Freq: Two times a day (BID) | NASAL | Status: DC
  Administered 2013-09-06 (×2): 1 via NASAL
  Filled 2013-09-05: qty 30

## 2013-09-05 MED ORDER — CYCLOSPORINE 0.05 % OP EMUL
1.0000 [drp] | Freq: Two times a day (BID) | OPHTHALMIC | Status: DC
  Administered 2013-09-06 – 2013-09-08 (×5): 1 [drp] via OPHTHALMIC
  Filled 2013-09-05 (×7): qty 1

## 2013-09-05 MED ORDER — DULOXETINE HCL 60 MG PO CPEP
90.0000 mg | ORAL_CAPSULE | Freq: Every day | ORAL | Status: DC
  Administered 2013-09-06: 90 mg via ORAL
  Filled 2013-09-05: qty 1

## 2013-09-05 MED ORDER — MEMANTINE HCL 5 MG PO TABS
5.0000 mg | ORAL_TABLET | Freq: Two times a day (BID) | ORAL | Status: DC
  Administered 2013-09-05 – 2013-09-07 (×5): 5 mg via ORAL
  Filled 2013-09-05 (×7): qty 1

## 2013-09-05 MED ORDER — KETOTIFEN FUMARATE 0.025 % OP SOLN
1.0000 [drp] | Freq: Two times a day (BID) | OPHTHALMIC | Status: DC
  Administered 2013-09-06 – 2013-09-08 (×5): 1 [drp] via OPHTHALMIC
  Filled 2013-09-05: qty 5

## 2013-09-05 MED ORDER — OXYCODONE HCL 5 MG PO TABS
5.0000 mg | ORAL_TABLET | ORAL | Status: DC | PRN
  Administered 2013-09-06 – 2013-09-08 (×3): 5 mg via ORAL
  Filled 2013-09-05 (×3): qty 1

## 2013-09-05 MED ORDER — POTASSIUM CHLORIDE IN NACL 20-0.9 MEQ/L-% IV SOLN
INTRAVENOUS | Status: DC
  Administered 2013-09-05: 21:00:00 via INTRAVENOUS
  Filled 2013-09-05 (×2): qty 1000

## 2013-09-05 MED ORDER — ASPIRIN-DIPYRIDAMOLE ER 25-200 MG PO CP12
1.0000 | ORAL_CAPSULE | Freq: Two times a day (BID) | ORAL | Status: DC
  Administered 2013-09-05 – 2013-09-07 (×5): 1 via ORAL
  Filled 2013-09-05 (×7): qty 1

## 2013-09-05 MED ORDER — IOHEXOL 300 MG/ML  SOLN
100.0000 mL | Freq: Once | INTRAMUSCULAR | Status: AC | PRN
  Administered 2013-09-05: 100 mL via INTRAVENOUS

## 2013-09-05 MED ORDER — POTASSIUM CHLORIDE CRYS ER 20 MEQ PO TBCR
40.0000 meq | EXTENDED_RELEASE_TABLET | Freq: Once | ORAL | Status: AC
  Administered 2013-09-05: 40 meq via ORAL
  Filled 2013-09-05: qty 2

## 2013-09-05 MED ORDER — DULOXETINE HCL 60 MG PO CPEP: 90.0000 mg | ORAL_CAPSULE | Freq: Every day | ORAL | Status: DC

## 2013-09-05 MED ORDER — MECLIZINE HCL 25 MG PO TABS
25.0000 mg | ORAL_TABLET | Freq: Four times a day (QID) | ORAL | Status: DC | PRN
  Filled 2013-09-05: qty 1

## 2013-09-05 NOTE — H&P (Addendum)
Hospitalist Admission History and Physical  Patient name: Dorothy Collins Medical record number: [id number] Date of birth: Mar 18, 1930 Age: 80 y.o. Gender: female  Primary Care Karimah Winquist: Ezequiel Kayser, MD  Chief Complaint: emesis, abd pain, weakness, new onset afib w/ intermittent RVR  History of Present Illness:This is a 80 y.o. year old female with multiple medical problems including history of dementia, history of CVA, depression, chronic pain, insomnia and recent admission for aspiration pneumonia presenting with emesis, weakness, new onset atrial fibrillation with intermittent RVR. Patient is a resident of a local skilled nursing facility. Was being seen at a high doctor appointment earlier today the patient had recurrent episodes of non-bloody nonbilious vomiting. Patient had generalized weakness from this time as well. EMS was called. Patient was found to be in A. fib with RVR with heart rates in the 120s on EMS arrival. Blood pressures also soft in the 100s. Patient is noted to have been recently hospitalized Dorothy Collins for sepsis secondary to aspiration pneumonia. Patient states that she has been otherwise normal state of health since hospital discharge. Had sudden onset of symptoms today. Denies any fever, chills, diarrhea, dysuria. Has had some mild abdominal pain associated with symptoms. Denies any new foods. Denies any new medications. States that she has been having intermittent nausea for several weeks to months. Denies any alcohol use. No hemiparesis or confusion. No paresthesias. Present to the ER afebrile. Heart rate in the 90s to 110s. Blood pressure in the 80s to 100s. Satting greater than 95% on room. White blood cell count 11.Collins, sodium 136, potassium 3.5, creatinine 0.72, glucose 139. Pro BNP is 1500. Chest x-ray shows emphysematous changes without acute infiltrate. Cardiology consult and given new onset A. fib with RVR. Patient not candidate for diltiazem given soft blood  pressures also not likely an anticoagulation candidate per consult note. Recommendations were for gentle hydration and observation.  Assessment and Plan: Dorothy Collins is a 80 y.o. year old female presenting with emesis, abdominal pain, weakness, A. fib with RVR.  Emesis/abdominal pain/weakness: Fairly broad differential for symptoms. Check CT of abdomen and pelvis with contrast to better assess. Check lipase. LFTs within normal limits. Urinalysis pending. No signs of infiltrate on chest x-ray. On exam today. Gently hydrate and reassess x-ray in the morning. Noted recent history of aspiration pneumonia. Afebrile hypoxia currently. Scheduled anti-emetics. Continue to follow. Also check MRI of the brain given new onset A. fib to rule out neurovascular component.  A. fib with RVR/HTN: Per cardiology, we'll continue to follow. Note elevated proBNP 1500 though clinically dry on exam. Gently hydrate. 2-D echo. Telemetry bed. Hold antihypertensive meds. Followup cardiology recs.   FEN/GI: Dysphagia 3 diet pending bedside swallow eval. PPI. Prophylaxis: Subcutaneous heparin Disposition: Pending further evaluation Code Status: DO NOT RESUSCITATE   Patient Active Problem List   Diagnosis Date Noted  . Atrial fibrillation with rapid ventricular response 09/05/2013  . Hypokalemia 09/05/2013  . Nausea & vomiting 09/05/2013  . Emesis 09/05/2013  . Hypotension 08/16/2013  . Sepsis 08/16/2013  . Aspiration into airway 08/16/2013  . H/O: CVA (cerebrovascular accident) 08/16/2013  . Hypoxia 08/16/2013  . AKI (acute kidney injury) 08/16/2013  . Hypertension, benign renovascular 10/02/2012  . Late effects of CVA (cerebrovascular accident) 10/02/2012  . Peripheral neuropathy 10/02/2012  . Chronic back pain 10/02/2012  . Dyslipidemia 10/02/2012  . Insomnia 10/02/2012  . GERD (gastroesophageal reflux disease) 10/02/2012  . Glaucoma 10/02/2012  . Constipation 10/02/2012  . Rhinitis, allergic 10/02/2012   .  Depression 10/02/2012  . Vascular dementia without behavioral disturbance 10/02/2012  . Occlusion and stenosis of carotid artery without mention of cerebral infarction 09/01/2011   Past Medical History: Past Medical History  Diagnosis Date  . Dementia   . CVA (cerebral vascular accident)   . COPD (chronic obstructive pulmonary disease)     Emphysema  . GERD (gastroesophageal reflux disease)   . Glaucoma   . Hyperlipidemia   . Insomnia   . Rhinitis 12/14/1999  . Depression 10/12/2009  . Cancer 07/18/2008    Breast cancer-Left  . Hypertension   . Arthritis 07/18/2008    osteoporosis  . Peripheral neuropathy 07/18/2008  . Cerebrovascular disease 08/05/2003    acute  . Lumbago 08/05/2003  . Edema 08/05/2003  . Cellulitis and abscess of leg 08/05/2003  . Peripheral vascular disease 08/05/2003  . Anxiety 08/05/2003  . Hypopotassemia 08/05/2003  . Anemia     Past Surgical History: Past Surgical History  Procedure Laterality Date  . Carotid endarterectomy  2011    Left CEA  . Eye surgery  07/18/2008    cataract  . Fracture surgery  07/18/2008    closed  part neck femur  . Joint replacement      rt knee  . Breast surgery      rt    Social History: History   Social History  . Marital Status: Widowed    Spouse Name: N/A    Number of Children: N/A  . Years of Education: N/A   Social History Main Topics  . Smoking status: Former Smoker    Types: Cigarettes    Quit date: 03/16/1999  . Smokeless tobacco: None  . Alcohol Use: No  . Drug Use: No  . Sexual Activity: None   Other Topics Concern  . None   Social History Narrative  . None    Family History: No family history on file.  Allergies: Allergies  Allergen Reactions  . Lyrica [Pregabalin]     unk  . Morphine And Related     unk  . Penicillins     unk  . Sulfa Antibiotics     unk    Current Facility-Administered Medications  Medication Dose Route Frequency Joh Rao Last Rate Last Dose  . 0.9 % NaCl with  KCl 20 mEq/ L  infusion   Intravenous Continuous Shanda Howells, MD      . antiseptic oral rinse (BIOTENE) solution 15 mL  15 mL Mouth Rinse BID Shanda Howells, MD      . azelastine (ASTELIN) 0.1 % nasal spray 1 spray  1 spray Each Nare BID Shanda Howells, MD      . clonazePAM [facility] [place]) tablet 0.5 mg  0.5 mg Oral TID PRN Shanda Howells, MD      . cycloSPORINE (RESTASIS) 0.05 % ophthalmic emulsion 1 drop  1 drop Both Eyes BID Shanda Howells, MD      . dipyridamole-aspirin (AGGRENOX) 200-25 MG per 12 hr capsule 1 capsule  1 capsule Oral BID Shanda Howells, MD      . DULoxetine (CYMBALTA) DR capsule 90 mg  90 mg Oral Daily Shanda Howells, MD      . gabapentin (NEURONTIN) capsule 400 mg  400 mg Oral TID Shanda Howells, MD      . heparin injection 5,000 Units  5,000 Units Subcutaneous 3 times per day Shanda Howells, MD      . ketotifen (ZADITOR) 0.025 % ophthalmic solution 1 drop  1 drop Both Eyes BID Shanda Howells, MD      .  meclizine (ANTIVERT) tablet 25 mg  25 mg Oral Q6H PRN Shanda Howells, MD      . memantine [facility]) tablet 5 mg  5 mg Oral BID Shanda Howells, MD      . omega-3 acid ethyl esters (LOVAZA) capsule 2 g  2 g Oral BID Shanda Howells, MD      . ondansetron [facility]) tablet 4 mg  4 mg Oral 4 times per day Shanda Howells, MD       Or  . ondansetron [facility]) injection 4 mg  4 mg Intravenous 4 times per day Shanda Howells, MD      . oxyCODONE (Oxy IR/ROXICODONE) immediate release tablet 5 mg  5 mg Oral Q4H PRN Shanda Howells, MD      . pantoprazole (PROTONIX) EC tablet 40 mg  40 mg Oral Daily Shanda Howells, MD      . risperiDONE (RISPERDAL M-TABS) disintegrating tablet 1 mg  1 mg Oral BID Shanda Howells, MD      . sennosides-docusate sodium (SENOKOT-S) Collins.6-50 MG tablet 1 tablet  1 tablet Oral Daily Shanda Howells, MD      . sodium chloride 0.9 % injection 3 mL  3 mL Intravenous Q12H Shanda Howells, MD      . zolpidem (AMBIEN) tablet 5 mg  5 mg Oral QHS PRN Shanda Howells, MD       Current Outpatient  Prescriptions  Medication Sig Dispense Refill  . antiseptic oral rinse (BIOTENE) LIQD 15 mLs by Mouth Rinse route 2 (two) times daily. Rinse mouth and spit      . azelastine (ASTELIN) 137 MCG/SPRAY nasal spray Place 1 spray into both nostrils 2 (two) times daily. Use in each nostril as directed for uncontrolled rhinorrhea.      . bimatoprost (LUMIGAN) 0.01 % SOLN Place 1 drop into both eyes daily.      . Brinzolamide-Brimonidine (SIMBRINZA) 1-0.2 % SUSP Place 1 drop into both eyes 2 (two) times daily.       . calcium-vitamin D (OSCAL WITH D) 500-200 MG-UNIT per tablet Take 1 tablet by mouth 2 (two) times daily.       . cholecalciferol (VITAMIN D) 400 UNITS TABS Take 400 Units by mouth 2 (two) times daily.      . Cholecalciferol 2000 UNITS CAPS Take 2,000 Units by mouth daily.      . clonazePAM (KLONOPIN) 0.5 MG tablet Take 0.5 mg by mouth 3 (three) times daily as needed for anxiety.      . cloNIDine (CATAPRES) 0.1 MG tablet Take 0.1 mg by mouth 2 (two) times daily as needed. For SBP> 180 & DBP > 95      . cycloSPORINE (RESTASIS) 0.05 % ophthalmic emulsion Place 1 drop into both eyes 2 (two) times daily.      Marland Kitchen dipyridamole-aspirin (AGGRENOX) 25-200 MG per 12 hr capsule Take 1 capsule by mouth 2 (two) times daily.      . DULoxetine (CYMBALTA) 30 MG capsule Take 30 mg by mouth daily. Takes with 60 mg for total of 90mg       . DULoxetine (CYMBALTA) 60 MG capsule Take 90 mg by mouth daily. Takes with 30mg  for total of 90mg       . fexofenadine (ALLEGRA) 180 MG tablet Take 180 mg by mouth daily.      Marland Kitchen gabapentin (NEURONTIN) 400 MG capsule Take 1 capsule (400 mg total) by mouth 3 (three) times daily.      Marland Kitchen ketotifen (ZADITOR) 0.025 % ophthalmic solution Place 1 drop  into both eyes 2 (two) times daily. Wait 3-5 minutes between eye meds      . lidocaine (LIDODERM) 5 % Place 1 patch onto the skin daily. Apply to back  -  Remove & Discard patch within 12 hours or as directed by MD      .  losartan-hydrochlorothiazide (HYZAAR) 100-25 MG per tablet Take 1 tablet by mouth daily.      . meclizine (ANTIVERT) 25 MG tablet Take 25 mg by mouth every 6 (six) hours as needed for dizziness.       . memantine (NAMENDA) 5 MG tablet Take 5 mg by mouth 2 (two) times daily.      Marland Kitchen omega-3 acid ethyl esters (LOVAZA) 1 G capsule Take 2 g by mouth 2 (two) times daily.      Marland Kitchen omeprazole (PRILOSEC) 20 MG capsule Take 40 mg by mouth 2 (two) times daily before a meal.       . oxyCODONE (OXY IR/ROXICODONE) 5 MG immediate release tablet Take 5 mg by mouth every 4 (four) hours as needed for severe pain.      Vladimir Faster Glycol-Propyl Glycol (SYSTANE OP) Apply 1 drop to eye every 2 (two) hours.       . risperiDONE (RISPERDAL M-TABS) 1 MG disintegrating tablet Take 1 mg by mouth 2 (two) times daily.      . sennosides-docusate sodium (SENOKOT-S) Collins.6-50 MG tablet Take 1 tablet by mouth daily.      Marland Kitchen ZOLMitriptan (ZOMIG) 2.5 MG tablet Take 2.5 mg by mouth as needed. For migraine      . zolpidem (AMBIEN) 5 MG tablet Take 5 mg by mouth at bedtime as needed for sleep.      . clonazePAM (KLONOPIN) 1 MG tablet Take one tablet by mouth every night at bedtime for anxiety/insomnia for 30 days. Hold for sedation  30 tablet  0   Review Of Systems: 12 point ROS negative except as noted above in HPI.  Physical Exam: Filed Vitals:   09/05/13 1800  BP: 104/51  Pulse: 105  Temp:   Resp: 19    General: cooperative and no distress HEENT: PERRLA, extra ocular movement intact and Dry oral mucosa Heart: S1, S2 normal, no murmur, rub or gallop, regular rate and rhythm Lungs: clear to auscultation, no wheezes or rales and unlabored breathing Abdomen: Positive bowel sounds, mild generalized abdominal tenderness Extremities: extremities normal, atraumatic, no cyanosis or edema Skin:no rashes, no ecchymoses Neurology: normal without focal findings  Labs and Imaging: Lab Results  Component Value Date/Time   NA 136*  09/05/2013  1:22 PM   K 3.5* 09/05/2013  1:22 PM   CL 94* 09/05/2013  1:22 PM   CO2 24 09/05/2013  1:22 PM   BUN 24* 09/05/2013  1:22 PM   CREATININE 0.72 09/05/2013  1:22 PM   GLUCOSE 139* 09/05/2013  1:22 PM   Lab Results  Component Value Date   WBC 11.Collins* 09/05/2013   HGB 13.9 09/05/2013   HCT 40.9 09/05/2013   MCV 86.Collins 09/05/2013   PLT 346 09/05/2013    Dg Chest Port 1 View  09/05/2013   CLINICAL DATA:  Weakness, fatigue for 1 day, history hypertension, stroke, COPD, LEFT breast cancer, former smoker  EXAM: PORTABLE CHEST - 1 VIEW  COMPARISON:  Portable exam 1331 hr compared to 08/17/2013  FINDINGS: Rotated to the LEFT.  Normal heart size, mediastinal contours and pulmonary vascularity.  Atherosclerotic calcification aorta.  Lungs emphysematous but clear.  No pleural effusion  or pneumothorax.  No acute osseous findings.  IMPRESSION: Emphysematous changes without acute infiltrate.   Electronically Signed   By: Lavonia Dana M.D.   On: 09/05/2013 13:48           Shanda Howells MD  Pager: [phone]

## 2013-09-05 NOTE — ED Notes (Addendum)
Pt refuses in and out cath. Pt provided a beverage, and agrees to use the bedpan when she is ready to go. MD made aware. Pt strongly encouraged to provide [place] a urine sample. Pt is refusing.

## 2013-09-05 NOTE — ED Notes (Signed)
[facility] Meal tray ordered for patient.

## 2013-09-05 NOTE — ED Notes (Signed)
Cardiology at bedside.

## 2013-09-05 NOTE — ED Provider Notes (Signed)
CSN: [id number]     Arrival date & time 09/05/13  1310 History   First MD Initiated Contact with Patient 09/05/13 1329     Chief Complaint  Patient presents with  . Weakness     (Consider location/radiation/quality/duration/timing/severity/associated sxs/prior Treatment) HPI Comments: Patient presents to the ER from nursing home. Patient was actually out the eye doctor today for followup after cataract surgery and was complaining of generalized weakness. She has had mild nausea. The eye doctor called EMS to evaluate the patient. Patient was found in atrial fibrillation which is a new problem for the patient. The patient's heart rate has been as high as the 120s, but at times is more regulated into the 90-100 range. Patient denies chest pain. She is not short of breath. She does have baseline dementia, commands or questions appropriately. Level V Caveat due to dementia.  Patient is a 80 y.o. female presenting with weakness.  Weakness    Past Medical History  Diagnosis Date  . Dementia   . CVA (cerebral vascular accident)   . COPD (chronic obstructive pulmonary disease)     Emphysema  . GERD (gastroesophageal reflux disease)   . Glaucoma   . Hyperlipidemia   . Insomnia   . Rhinitis 12/14/1999  . Depression 10/12/2009  . Cancer 07/18/2008    Breast cancer-Left  . Hypertension   . Arthritis 07/18/2008    osteoporosis  . Peripheral neuropathy 07/18/2008  . Cerebrovascular disease 08/05/2003    acute  . Lumbago 08/05/2003  . Edema 08/05/2003  . Cellulitis and abscess of leg 08/05/2003  . Peripheral vascular disease 08/05/2003  . Anxiety 08/05/2003  . Hypopotassemia 08/05/2003  . Anemia    Past Surgical History  Procedure Laterality Date  . Carotid endarterectomy  2011    Left CEA  . Eye surgery  07/18/2008    cataract  . Fracture surgery  07/18/2008    closed  part neck femur  . Joint replacement      rt knee  . Breast surgery      rt   No family history on file. History  Substance  Use Topics  . Smoking status: Former Smoker    Types: Cigarettes    Quit date: 03/16/1999  . Smokeless tobacco: Not on file  . Alcohol Use: No   OB History   Grav Para Term Preterm Abortions TAB SAB Ect Mult Living                 Review of Systems  Unable to perform ROS: Dementia  Neurological: Positive for weakness.      Allergies  Lyrica; Morphine and related; Penicillins; and Sulfa antibiotics  Home Medications   Prior to Admission medications   Medication Sig Start Date End Date Taking? Authorizing Provider  antiseptic oral rinse (BIOTENE) LIQD 15 mLs by Mouth Rinse route 2 (two) times daily. Rinse mouth and spit   Yes Historical Provider, MD  azelastine (ASTELIN) 137 MCG/SPRAY nasal spray Place 1 spray into both nostrils 2 (two) times daily. Use in each nostril as directed for uncontrolled rhinorrhea.   Yes Historical Provider, MD  bimatoprost (LUMIGAN) 0.01 % SOLN Place 1 drop into both eyes daily.   Yes Historical Provider, MD  Brinzolamide-Brimonidine [facility]) 1-0.2 % SUSP Place 1 drop into both eyes 2 (two) times daily.    Yes Historical Provider, MD  calcium-vitamin D (OSCAL WITH D) 500-200 MG-UNIT per tablet Take 1 tablet by mouth 2 (two) times daily.    Yes Historical  Provider, MD  cholecalciferol (VITAMIN D) 400 UNITS TABS Take 400 Units by mouth 2 (two) times daily.   Yes Historical Provider, MD  Cholecalciferol 2000 UNITS CAPS Take 2,000 Units by mouth daily.   Yes Historical Provider, MD  clonazePAM (KLONOPIN) 0.5 MG tablet Take 0.5 mg by mouth 3 (three) times daily as needed for anxiety.   Yes Historical Provider, MD  cloNIDine (CATAPRES) 0.1 MG tablet Take 0.1 mg by mouth 2 (two) times daily as needed. For SBP> 180 & DBP > 95   Yes Historical Provider, MD  cycloSPORINE (RESTASIS) 0.05 % ophthalmic emulsion Place 1 drop into both eyes 2 (two) times daily.   Yes Historical Provider, MD  dipyridamole-aspirin (AGGRENOX) 25-200 MG per 12 hr capsule Take 1 capsule  by mouth 2 (two) times daily.   Yes Historical Provider, MD  DULoxetine (CYMBALTA) 30 MG capsule Take 30 mg by mouth daily. Takes with 60 mg for total of 90mg    Yes Historical Provider, MD  DULoxetine (CYMBALTA) 60 MG capsule Take 90 mg by mouth daily. Takes with 30mg  for total of 90mg    Yes Historical Provider, MD  fexofenadine (ALLEGRA) 180 MG tablet Take 180 mg by mouth daily.   Yes Historical Provider, MD  gabapentin (NEURONTIN) 400 MG capsule Take 1 capsule (400 mg total) by mouth 3 (three) times daily. 08/20/13  Yes Domenic Polite, MD  ketotifen (ZADITOR) 0.025 % ophthalmic solution Place 1 drop into both eyes 2 (two) times daily. Wait 3-5 minutes between eye meds   Yes Historical Provider, MD  lidocaine (LIDODERM) 5 % Place 1 patch onto the skin daily. Apply to back  -  Remove & Discard patch within 12 hours or as directed by MD   Yes Historical Provider, MD  losartan-hydrochlorothiazide (HYZAAR) 100-25 MG per tablet Take 1 tablet by mouth daily.   Yes Historical Provider, MD  meclizine (ANTIVERT) 25 MG tablet Take 25 mg by mouth every 6 (six) hours as needed for dizziness.    Yes Historical Provider, MD  memantine (NAMENDA) 5 MG tablet Take 5 mg by mouth 2 (two) times daily.   Yes Historical Provider, MD  omega-3 acid ethyl esters (LOVAZA) 1 G capsule Take 2 g by mouth 2 (two) times daily.   Yes Historical Provider, MD  omeprazole (PRILOSEC) 20 MG capsule Take 40 mg by mouth 2 (two) times daily before a meal.    Yes Historical Provider, MD  oxyCODONE (OXY IR/ROXICODONE) 5 MG immediate release tablet Take 5 mg by mouth every 4 (four) hours as needed for severe pain.   Yes Historical Provider, MD  Polyethyl Glycol-Propyl Glycol (SYSTANE OP) Apply 1 drop to eye every 2 (two) hours.    Yes Historical Provider, MD  risperiDONE (RISPERDAL M-TABS) 1 MG disintegrating tablet Take 1 mg by mouth 2 (two) times daily.   Yes Historical Provider, MD  sennosides-docusate sodium (SENOKOT-S) 8.6-50 MG tablet  Take 1 tablet by mouth daily.   Yes Historical Provider, MD  ZOLMitriptan (ZOMIG) 2.5 MG tablet Take 2.5 mg by mouth as needed. For migraine   Yes Historical Provider, MD  zolpidem (AMBIEN) 5 MG tablet Take 5 mg by mouth at bedtime as needed for sleep.   Yes Historical Provider, MD  clonazePAM (KLONOPIN) 1 MG tablet Take one tablet by mouth every night at bedtime for anxiety/insomnia for 30 days. Hold for sedation 09/04/13   Mahima Pandey, MD   BP 80/50  Pulse 102  Temp(Src) 97.8 F (36.6 C) (Oral)  Resp  21  SpO2 97% Physical Exam  Constitutional: She is oriented to person, place, and time. She appears well-developed and well-nourished. No distress.  HENT:  Head: Normocephalic and atraumatic.  Right Ear: Hearing normal.  Left Ear: Hearing normal.  Nose: Nose normal.  Mouth/Throat: Oropharynx is clear and moist and mucous membranes are normal.  Eyes: Conjunctivae and EOM are normal. Pupils are equal, round, and reactive to light.  Neck: Normal range of motion. Neck supple.  Cardiovascular: S1 normal and S2 normal.  An irregularly irregular rhythm present. Exam reveals no gallop and no friction rub.   No murmur heard. Pulmonary/Chest: Effort normal and breath sounds normal. No respiratory distress. She exhibits no tenderness.  Abdominal: Soft. Normal appearance and bowel sounds are normal. There is no hepatosplenomegaly. There is no tenderness. There is no rebound, no guarding, no tenderness at McBurney's point and negative Murphy's sign. No hernia.  Musculoskeletal: Normal range of motion.  Neurological: She is alert and oriented to person, place, and time. She has normal strength. No cranial nerve deficit or sensory deficit. Coordination normal. GCS eye subscore is 4. GCS verbal subscore is 5. GCS motor subscore is 6.  Skin: Skin is warm, dry and intact. No rash noted. No cyanosis.  Psychiatric: She has a normal mood and affect. Her speech is normal and behavior is normal. Thought content  normal.    ED Course  Procedures (including critical care time) Labs Review Labs Reviewed  BASIC METABOLIC PANEL - Abnormal; Notable for the following:    Sodium 136 (*)    Potassium 3.5 (*)    Chloride 94 (*)    Glucose, Bld 139 (*)    BUN 24 (*)    GFR calc non Af Amer 78 (*)    All other components within normal limits  CBC - Abnormal; Notable for the following:    WBC 11.8 (*)    All other components within normal limits  PRO B NATRIURETIC PEPTIDE - Abnormal; Notable for the following:    Pro B Natriuretic peptide (BNP) 1509.0 (*)    All other components within normal limits  TSH  I-STAT TROPOININ, ED    Imaging Review Dg Chest Port 1 View  09/05/2013   CLINICAL DATA:  Weakness, fatigue for 1 day, history hypertension, stroke, COPD, LEFT breast cancer, former smoker  EXAM: PORTABLE CHEST - 1 VIEW  COMPARISON:  Portable exam 1331 hr compared to 08/17/2013  FINDINGS: Rotated to the LEFT.  Normal heart size, mediastinal contours and pulmonary vascularity.  Atherosclerotic calcification aorta.  Lungs emphysematous but clear.  No pleural effusion or pneumothorax.  No acute osseous findings.  IMPRESSION: Emphysematous changes without acute infiltrate.   Electronically Signed   By: Lavonia Dana M.D.   On: 09/05/2013 13:48     EKG Interpretation   Date/Time:  Wednesday Karys 24 2015 13:15:20 EDT Ventricular Rate:  117 PR Interval:    QRS Duration: 89 QT Interval:  329 QTC Calculation: 459 R Axis:   48 Text Interpretation:  Atrial fibrillation Ventricular premature complex  Borderline repolarization abnormality Baseline wander in lead(s) V1 V4  Confirmed by POLLINA  MD, CHRISTOPHER [phone]) on 09/05/2013 2:51:42 PM      MDM   Final diagnoses:  None   new onset atrial fibrillation  Generalized weakness  Presents to the ER for evaluation of generalized weakness. Patient brought to ER by ambulance. He mass was identified atrial fibrillation with rapid ventricular response  during transport. Her response, however, has been 120  times, has been around 100 generally. She is not in any distress. As her heart rate has been generally in the 90s to 100 range with occasional spikes above 100, no treatment has been provided. This is specifically because she has been borderline hypotensive. This was treated with a small fluid bolus. Cardiology consult to evaluate the patient for further management of atrial fibrillation with rapid ventricular response.  Orpah Greek, MD 09/05/13 [phone]

## 2013-09-05 NOTE — ED Notes (Signed)
Per EMS- pt lives at [facility], was at eye doctor today for cataract surgery follow up, pt c/o of weakness and nausea for 3 days and they called EMS. Found to be in AFIB which is new. Pt c/o right hip pain since she got here, no falls. BP 136/84, AFIB HR 80-120

## 2013-09-05 NOTE — ED Provider Notes (Signed)
6:24 PM Given new onset afib w/ soft BP's will admit to hospitalist.   Clinical Impression 1. Atrial fibrillation with rapid ventricular response   2. Chronic back pain   3. H/O: CVA (cerebrovascular accident)   4. Hypertension, benign renovascular   5. Hypokalemia   6. Hypotension, unspecified hypotension type   7. Nausea and vomiting, vomiting of unspecified type   8. Vascular dementia without behavioral disturbance   9. Atrial fibrillation with RVR   10. Non-intractable vomiting with nausea, vomiting of unspecified type      Blanchard Kelch, MD 09/06/13 [phone]

## 2013-09-05 NOTE — ED Notes (Signed)
Pt is refusing to give sample for UA or in and out cath.

## 2013-09-05 NOTE — Consult Note (Signed)
Reason for Consult: Atrial fibrillation Referring Physician:  Moesha B Revere is an 80 y.o. female.  HPI:   The patient is an 80 year old female with a history of COPD, CVA, dementia, hyperlipidemia, hypertension, peripheral vascular disease, depression, GERD.  She lives at [facility] and according to previous notes, is bed bound at baseline.  Although, she told me she drove herself to the eye doctor. She was at the eye doctor earlier today when EMS was called to evaluate her.  2-D echocardiogram and September 2011 which showed  ejection fraction 04-88%, grade 1 diastolic dysfunction.   She presents with generalized weakness, dizziness, nausea, vomiting for 2-3 days. She also complains of mild abdominal pain, chronic in her neck, hip pain.  She says that she has not been eating or drinking very much in the last 2-3 days.  Past Medical History  Diagnosis Date  . Dementia   . CVA (cerebral vascular accident)   . COPD (chronic obstructive pulmonary disease)     Emphysema  . GERD (gastroesophageal reflux disease)   . Glaucoma   . Hyperlipidemia   . Insomnia   . Rhinitis 12/14/1999  . Depression 10/12/2009  . Cancer 07/18/2008    Breast cancer-Left  . Hypertension   . Arthritis 07/18/2008    osteoporosis  . Peripheral neuropathy 07/18/2008  . Cerebrovascular disease 08/05/2003    acute  . Lumbago 08/05/2003  . Edema 08/05/2003  . Cellulitis and abscess of leg 08/05/2003  . Peripheral vascular disease 08/05/2003  . Anxiety 08/05/2003  . Hypopotassemia 08/05/2003  . Anemia     Past Surgical History  Procedure Laterality Date  . Carotid endarterectomy  2011    Left CEA  . Eye surgery  07/18/2008    cataract  . Fracture surgery  07/18/2008    closed  part neck femur  . Joint replacement      rt knee  . Breast surgery      rt    No family history on file.  Social History:  reports that she quit smoking about 14 years ago. Her smoking use included Cigarettes. She smoked 0.00  packs per day. She does not have any smokeless tobacco history on file. She reports that she does not drink alcohol or use illicit drugs.  Allergies:  Allergies  Allergen Reactions  . Lyrica [Pregabalin]     unk  . Morphine And Related     unk  . Penicillins     unk  . Sulfa Antibiotics     unk    Medications:  Prior to Admission medications   Medication Sig Start Date End Date Taking? Authorizing Provider  antiseptic oral rinse (BIOTENE) LIQD 15 mLs by Mouth Rinse route 2 (two) times daily. Rinse mouth and spit   Yes Historical Provider, MD  azelastine (ASTELIN) 137 MCG/SPRAY nasal spray Place 1 spray into both nostrils 2 (two) times daily. Use in each nostril as directed for uncontrolled rhinorrhea.   Yes Historical Provider, MD  bimatoprost (LUMIGAN) 0.01 % SOLN Place 1 drop into both eyes daily.   Yes Historical Provider, MD  Brinzolamide-Brimonidine [facility]) 1-0.2 % SUSP Place 1 drop into both eyes 2 (two) times daily.    Yes Historical Provider, MD  calcium-vitamin D (OSCAL WITH D) 500-200 MG-UNIT per tablet Take 1 tablet by mouth 2 (two) times daily.    Yes Historical Provider, MD  cholecalciferol (VITAMIN D) 400 UNITS TABS Take 400 Units by mouth 2 (two) times daily.  Yes Historical Provider, MD  Cholecalciferol 2000 UNITS CAPS Take 2,000 Units by mouth daily.   Yes Historical Provider, MD  clonazePAM (KLONOPIN) 0.5 MG tablet Take 0.5 mg by mouth 3 (three) times daily as needed for anxiety.   Yes Historical Provider, MD  cloNIDine (CATAPRES) 0.1 MG tablet Take 0.1 mg by mouth 2 (two) times daily as needed. For SBP> 180 & DBP > 95   Yes Historical Provider, MD  cycloSPORINE (RESTASIS) 0.05 % ophthalmic emulsion Place 1 drop into both eyes 2 (two) times daily.   Yes Historical Provider, MD  dipyridamole-aspirin (AGGRENOX) 25-200 MG per 12 hr capsule Take 1 capsule by mouth 2 (two) times daily.   Yes Historical Provider, MD  DULoxetine (CYMBALTA) 30 MG capsule Take 30 mg by  mouth daily. Takes with 60 mg for total of $Remove'90mg'zAplHWE$    Yes Historical Provider, MD  DULoxetine (CYMBALTA) 60 MG capsule Take 90 mg by mouth daily. Takes with $RemoveBe'30mg'[id number]$  for total of $Remove'90mg'DBsQlXz$    Yes Historical Provider, MD  fexofenadine (ALLEGRA) 180 MG tablet Take 180 mg by mouth daily.   Yes Historical Provider, MD  gabapentin (NEURONTIN) 400 MG capsule Take 1 capsule (400 mg total) by mouth 3 (three) times daily. 08/20/13  Yes Domenic Polite, MD  ketotifen (ZADITOR) 0.025 % ophthalmic solution Place 1 drop into both eyes 2 (two) times daily. Wait 3-5 minutes between eye meds   Yes Historical Provider, MD  lidocaine (LIDODERM) 5 % Place 1 patch onto the skin daily. Apply to back  -  Remove & Discard patch within 12 hours or as directed by MD   Yes Historical Provider, MD  losartan-hydrochlorothiazide (HYZAAR) 100-25 MG per tablet Take 1 tablet by mouth daily.   Yes Historical Provider, MD  meclizine (ANTIVERT) 25 MG tablet Take 25 mg by mouth every 6 (six) hours as needed for dizziness.    Yes Historical Provider, MD  memantine (NAMENDA) 5 MG tablet Take 5 mg by mouth 2 (two) times daily.   Yes Historical Provider, MD  omega-3 acid ethyl esters (LOVAZA) 1 G capsule Take 2 g by mouth 2 (two) times daily.   Yes Historical Provider, MD  omeprazole (PRILOSEC) 20 MG capsule Take 40 mg by mouth 2 (two) times daily before a meal.    Yes Historical Provider, MD  oxyCODONE (OXY IR/ROXICODONE) 5 MG immediate release tablet Take 5 mg by mouth every 4 (four) hours as needed for severe pain.   Yes Historical Provider, MD  Polyethyl Glycol-Propyl Glycol (SYSTANE OP) Apply 1 drop to eye every 2 (two) hours.    Yes Historical Provider, MD  risperiDONE (RISPERDAL M-TABS) 1 MG disintegrating tablet Take 1 mg by mouth 2 (two) times daily.   Yes Historical Provider, MD  sennosides-docusate sodium (SENOKOT-S) 8.6-50 MG tablet Take 1 tablet by mouth daily.   Yes Historical Provider, MD  ZOLMitriptan (ZOMIG) 2.5 MG tablet Take 2.5 mg by  mouth as needed. For migraine   Yes Historical Provider, MD  zolpidem (AMBIEN) 5 MG tablet Take 5 mg by mouth at bedtime as needed for sleep.   Yes Historical Provider, MD  clonazePAM (KLONOPIN) 1 MG tablet Take one tablet by mouth every night at bedtime for anxiety/insomnia for 30 days. Hold for sedation 09/04/13   Blanchie Serve, MD     Results for orders placed during the hospital encounter of 09/05/13 (from the past 48 hour(s))  BASIC METABOLIC PANEL     Status: Abnormal   Collection Time  09/05/13  1:22 PM      Result Value Ref Range   Sodium 136 (*) 137 - 147 mEq/L   Potassium 3.5 (*) 3.7 - 5.3 mEq/L   Chloride 94 (*) 96 - 112 mEq/L   CO2 24  19 - 32 mEq/L   Glucose, Bld 139 (*) 70 - 99 mg/dL   BUN 24 (*) 6 - 23 mg/dL   Creatinine, Ser 0.72  0.50 - 1.10 mg/dL   Calcium 10.0  8.4 - 10.5 mg/dL   GFR calc non Af Amer 78 (*) >90 mL/min   GFR calc Af Amer >90  >90 mL/min   Comment: (NOTE)     The eGFR has been calculated using the CKD EPI equation.     This calculation has not been validated in all clinical situations.     eGFR's persistently <90 mL/min signify possible Chronic Kidney     Disease.  CBC     Status: Abnormal   Collection Time    09/05/13  1:22 PM      Result Value Ref Range   WBC 11.8 (*) 4.0 - 10.5 K/uL   RBC 4.71  3.87 - 5.11 MIL/uL   Hemoglobin 13.9  12.0 - 15.0 g/dL   HCT 40.9  36.0 - 46.0 %   MCV 86.8  78.0 - 100.0 fL   MCH 29.5  26.0 - 34.0 pg   MCHC 34.0  30.0 - 36.0 g/dL   RDW 13.5  11.5 - 15.5 %   Platelets 346  150 - 400 K/uL  I-STAT TROPOININ, ED     Status: None   Collection Time    09/05/13  1:33 PM      Result Value Ref Range   Troponin i, poc 0.04  0.00 - 0.08 ng/mL   Comment 3            Comment: Due to the release kinetics of cTnI,     a negative result within the first hours     of the onset of symptoms does not rule out     myocardial infarction with certainty.     If myocardial infarction is still suspected,     repeat the test at  appropriate intervals.  PRO B NATRIURETIC PEPTIDE     Status: Abnormal   Collection Time    09/05/13  2:16 PM      Result Value Ref Range   Pro B Natriuretic peptide (BNP) 1509.0 (*) 0 - 450 pg/mL  TSH     Status: None   Collection Time    09/05/13  2:16 PM      Result Value Ref Range   TSH 1.960  0.350 - 4.500 uIU/mL    Dg Chest Port 1 View  09/05/2013   CLINICAL DATA:  Weakness, fatigue for 1 day, history hypertension, stroke, COPD, LEFT breast cancer, former smoker  EXAM: PORTABLE CHEST - 1 VIEW  COMPARISON:  Portable exam 1331 hr compared to 08/17/2013  FINDINGS: Rotated to the LEFT.  Normal heart size, mediastinal contours and pulmonary vascularity.  Atherosclerotic calcification aorta.  Lungs emphysematous but clear.  No pleural effusion or pneumothorax.  No acute osseous findings.  IMPRESSION: Emphysematous changes without acute infiltrate.   Electronically Signed   By: Lavonia Dana M.D.   On: 09/05/2013 13:48    Review of Systems  Constitutional: Positive for malaise/fatigue. Negative for fever.  HENT: Negative for congestion and sore throat.   Respiratory: Negative for cough and shortness of breath.  Cardiovascular: Negative for chest pain, orthopnea, leg swelling and PND.  Gastrointestinal: Positive for nausea, vomiting and abdominal pain. Negative for blood in stool and melena.  Genitourinary: Negative for hematuria.  Musculoskeletal: Positive for neck pain.  Neurological: Positive for dizziness and weakness.  All other systems reviewed and are negative.  Blood pressure 105/64, pulse 102, temperature 97.8 F (36.6 C), temperature source Oral, resp. rate 23, SpO2 95.00%. Physical Exam  Nursing note and vitals reviewed. Constitutional: She appears well-developed and well-nourished. No distress.  HENT:  Head: Normocephalic and atraumatic.  Eyes: EOM are normal. Pupils are equal, round, and reactive to light. No scleral icterus.  Neck: Normal range of motion. Neck supple.    Cardiovascular: S1 normal and S2 normal.  An irregularly irregular rhythm present. Tachycardia present.   No murmur heard. Pulses:      Radial pulses are 2+ on the right side, and 2+ on the left side.       Dorsalis pedis pulses are 0 on the right side, and 0 on the left side.  No carotid bruit  Respiratory: Effort normal and breath sounds normal. She has no wheezes. She has no rales.  GI: Soft. Bowel sounds are normal. There is tenderness.  Musculoskeletal: She exhibits no edema.  Lymphadenopathy:    She has no cervical adenopathy.  Neurological: She is alert. She exhibits normal muscle tone.  Oriented to placed, Month  Skin: Skin is warm and dry.  Psychiatric: She has a normal mood and affect.    Assessment/Plan: Active Problems:   Atrial fibrillation with rapid ventricular response   Dyslipidemia   Vascular dementia without behavioral disturbance   Hypotension   Hypokalemia   Nausea & vomiting   Leukocytosis  Plan:   The patient is afib with rates ranging from the 80's to 110's.  BP will not allow start of cardizem.  She was given a 269ml bolus of NS.  If her history is accurate, afib may be triggered by acute illness.  No prior cardiac history according to records and the patient.  Chadsvasc of 7 but I am concerned about overall functionality and is likely a high fall risk.  Will not anticoagulate.  According to previous PT eval she normally can transfer from bed to wheelchair. Replace fluids and potassium.  Continue to monitor rate.  Hold home BP meds.  I would DC clonidine.  Hopefully she will convert on her own.  TSH is WNL  HAGER, BRYAN, PAC 09/05/2013, 4:30 PM    Agree with note written by Luisa Dago PAC  Pt with newly recognized Afib with RVR. Recently admitted a few weeks ago. Multiple medical probs as outlined. Pt has had generalized weekness with decreased PO intake. H/O nl LVEF by 2D. She denies CP/SOB. Her BP is soft  With H/O HTN on Hyzaar. Her exam is notable  for right carotid bruit, clear lung, cor irreg. EKG Afib. BNP mildly elevated, K+ slightly low. There has to be a precipitating cause and I suspect she will spontaneously convert. UA is not C/W UTI. BP to low for IV dilt and rate is currently 80-100. She does not appear to be a coumadin or Noac candidate. Would monitor and Rx conservatively for now. Will follow with you.  Lorretta Harp 09/05/2013 5:20 PM

## 2013-09-06 ENCOUNTER — Observation Stay (HOSPITAL_COMMUNITY): Payer: Medicare Other

## 2013-09-06 DIAGNOSIS — I4891 Unspecified atrial fibrillation: Secondary | ICD-10-CM

## 2013-09-06 LAB — CBC WITH DIFFERENTIAL/PLATELET
Basophils Absolute: 0 10*3/uL (ref 0.0–0.1)
Basophils Relative: 0 % (ref 0–1)
EOS ABS: 0.2 10*3/uL (ref 0.0–0.7)
Eosinophils Relative: 2 % (ref 0–5)
HEMATOCRIT: 34.7 % — AB (ref 36.0–46.0)
HEMOGLOBIN: 11.6 g/dL — AB (ref 12.0–15.0)
Lymphocytes Relative: 17 % (ref 12–46)
Lymphs Abs: 1.5 10*3/uL (ref 0.7–4.0)
MCH: 29.8 pg (ref 26.0–34.0)
MCHC: 33.4 g/dL (ref 30.0–36.0)
MCV: 89.2 fL (ref 78.0–100.0)
MONO ABS: 0.7 10*3/uL (ref 0.1–1.0)
MONOS PCT: 8 % (ref 3–12)
NEUTROS ABS: 6.4 10*3/uL (ref 1.7–7.7)
Neutrophils Relative %: 73 % (ref 43–77)
Platelets: 285 10*3/uL (ref 150–400)
RBC: 3.89 MIL/uL (ref 3.87–5.11)
RDW: 13.7 % (ref 11.5–15.5)
WBC: 8.8 10*3/uL (ref 4.0–10.5)

## 2013-09-06 LAB — COMPREHENSIVE METABOLIC PANEL
ALBUMIN: 2.5 g/dL — AB (ref 3.5–5.2)
ALT: 12 U/L (ref 0–35)
AST: 21 U/L (ref 0–37)
Alkaline Phosphatase: 78 U/L (ref 39–117)
BUN: 23 mg/dL (ref 6–23)
CALCIUM: 8.7 mg/dL (ref 8.4–10.5)
CO2: 23 meq/L (ref 19–32)
CREATININE: 0.68 mg/dL (ref 0.50–1.10)
Chloride: 98 mEq/L (ref 96–112)
GFR calc Af Amer: >90 mL/min (ref >90)
GFR, EST NON AFRICAN AMERICAN: 80 mL/min — AB (ref >90)
Glucose, Bld: 103 mg/dL — ABNORMAL HIGH (ref 70–99)
Potassium: 5.1 mEq/L (ref 3.7–5.3)
SODIUM: 134 meq/L — AB (ref 137–147)
TOTAL PROTEIN: 5.5 g/dL — AB (ref 6.0–8.3)
Total Bilirubin: 0.4 mg/dL (ref 0.3–1.2)

## 2013-09-06 LAB — HEMOGLOBIN A1C
Hgb A1c MFr Bld: 6.2 % — ABNORMAL HIGH (ref <5.7)
Mean Plasma Glucose: 131 mg/dL — ABNORMAL HIGH (ref <117)

## 2013-09-06 LAB — TROPONIN I: Troponin I: <0.3 ng/mL (ref <0.30)

## 2013-09-06 MED ORDER — SODIUM CHLORIDE 0.9 % IV SOLN
INTRAVENOUS | Status: DC
  Administered 2013-09-07 – 2013-09-08 (×2): via INTRAVENOUS

## 2013-09-06 NOTE — Progress Notes (Signed)
Subjective: Lethargic, wakes and moans but does not answer questions.  RN stated pt had trazadone last pm  Objective: Vital signs in last 24 hours: Temp:  [97.8 F (36.6 C)-98.5 F (36.9 C)] 98.5 F (36.9 C) (06/25 0552) Pulse Rate:  [96-115] 98 (06/25 0552) Resp:  [18-23] 18 (06/25 0552) BP: (80-111)/(44-66) 97/46 mmHg (06/25 0552) SpO2:  [94 %-98 %] 94 % (06/25 0552) Weight:  [136 lb (61.689 kg)] 136 lb (61.689 kg) (06/25 0552) Weight change:  Last BM Date: 09/04/13 Intake/Output from previous day:  Unable to do       PE: General:lethargic, NAD Skin:Warm and dry, brisk capillary refill, color pale HEENT:normocephalic, sclera clear, mucus membranes moist Heart:irreg irreg without murmur, gallup, rub or click Lungs:clear,  without rales, rhonchi, or wheezes ZOX:WRUE, + tenderness, + BS, do not palpate liver spleen or masses Ext:tr lower ext edema,  Neuro:lethargic, attempts to answer questions, MAE, follows commands, + facial symmetry   Lab Results:  Recent Labs  09/05/13 1941 09/06/13 0030  WBC 10.9* 8.8  HGB 12.8 11.6*  HCT 38.0 34.7*  PLT 321 285   BMET  Recent Labs  09/05/13 1322 09/05/13 1941 09/06/13 0030  NA 136*  --  134*  K 3.5*  --  5.1  CL 94*  --  98  CO2 24  --  23  GLUCOSE 139*  --  103*  BUN 24*  --  23  CREATININE 0.72 0.83 0.68  CALCIUM 10.0  --  8.7    Recent Labs  09/06/13 0020 09/06/13 0547  TROPONINI <0.30 <0.30     Lab Results  Component Value Date   HGBA1C 6.2* 09/05/2013     Lab Results  Component Value Date   TSH 1.960 09/05/2013    Hepatic Function Panel  Recent Labs  09/06/13 0030  PROT 5.5*  ALBUMIN 2.5*  AST 21  ALT 12  ALKPHOS 78  BILITOT 0.4    Studies/Results: Mr Brain Wo Contrast  09/05/2013   CLINICAL DATA:  New onset atrial fibrillation.  Question CVA.  EXAM: MRI HEAD WITHOUT CONTRAST  TECHNIQUE: Multiplanar, multiecho pulse sequences of the brain and surrounding structures were  obtained without intravenous contrast.  COMPARISON:  CT head 08/30/2011.  MRI brain 12/02/2009.  FINDINGS: The diffusion-weighted images demonstrate no evidence for acute or subacute infarction. The study is moderately degraded by patient motion. Extensive periventricular white matter changes are again noted. There is no significant interval change. Moderate generalized atrophy is present. Some white matter changes extend into the brainstem.  Flow present in the vertebrobasilar system. Abnormal signal is present in the right petrous and precavernous carotid artery suggesting high-grade stenosis versus occlusion.  IMPRESSION: 1. No acute intracranial abnormality. 2. Stable atrophy and diffuse white matter disease. This likely reflects the sequelae of chronic microvascular ischemia. 3. Abnormal signal within the right petrous and precavernous carotid artery is new. This may represent a high-grade stenosis or occlusion.   Electronically Signed   By: Lawrence Santiago M.D.   On: 09/05/2013 20:43   Ct Abdomen Pelvis W Contrast  09/05/2013   CLINICAL DATA:  Weakness and nausea for 3 days. Right hip pain. Vomiting. No falls. Abdominal pain.  EXAM: CT ABDOMEN AND PELVIS WITH CONTRAST  TECHNIQUE: Multidetector CT imaging of the abdomen and pelvis was performed using the standard protocol following bolus administration of intravenous contrast.  CONTRAST:  15mL OMNIPAQUE IOHEXOL 300 MG/ML  SOLN  COMPARISON:  None.  FINDINGS: Noncalcified nodule in the anterior right middle lung measuring 4 mm diameter. This is likely benign in a low risk patient.  Surgical absence of the gallbladder. There is moderate intra and extrahepatic bile duct dilatation. This may be due to postoperative physiology. No obstructing mass or stone is identified. Pancreatic duct is not dilated. Diffuse fatty infiltration of the pancreas. No focal liver lesions. Left renal cysts, largest in the upper pole measuring 7.5 cm diameter. No hydronephrosis in the  kidneys. The spleen, adrenal glands, inferior vena cava, and retroperitoneal lymph nodes are unremarkable. Diffuse calcification of the abdominal aorta and iliac arteries. Iliac stenosis is likely. Aorta iliac vessels appear patent. The stomach, small bowel, and colon are not abnormally distended. Contrast material flows through to the colon without evidence of obstruction. Scattered stool in the colon. No free air or free fluid in the abdomen.  Pelvis: Visualization of portions of the pelvis is limited due to streak artifact arising from right hip arthroplasty. The appendix is not identified. No evidence of diverticulitis. No pelvic mass or lymphadenopathy is appreciated. With degenerative changes throughout the lumbar spine. No destructive bone lesions appreciated. Fall  IMPRESSION: Bile duct dilatation is likely due to postoperative physiology. Gallbladder is surgically absent. No focal acute process suggested in the abdomen or pelvis. No evidence of bowel obstruction.   Electronically Signed   By: Lucienne Capers M.D.   On: 09/05/2013 23:27   Dg Chest Port 1 View  09/05/2013   CLINICAL DATA:  Weakness, fatigue for 1 day, history hypertension, stroke, COPD, LEFT breast cancer, former smoker  EXAM: PORTABLE CHEST - 1 VIEW  COMPARISON:  Portable exam 1331 hr compared to 08/17/2013  FINDINGS: Rotated to the LEFT.  Normal heart size, mediastinal contours and pulmonary vascularity.  Atherosclerotic calcification aorta.  Lungs emphysematous but clear.  No pleural effusion or pneumothorax.  No acute osseous findings.  IMPRESSION: Emphysematous changes without acute infiltrate.   Electronically Signed   By: Lavonia Dana M.D.   On: 09/05/2013 13:48    Medications: I have reviewed the patient's current medications. Scheduled Meds: . antiseptic oral rinse  15 mL Mouth Rinse BID  . azelastine  1 spray Each Nare BID  . cycloSPORINE  1 drop Both Eyes BID  . dipyridamole-aspirin  1 capsule Oral BID  . DULoxetine  90  mg Oral Daily  . gabapentin  400 mg Oral TID  . heparin  5,000 Units Subcutaneous 3 times per day  . ketotifen  1 drop Both Eyes BID  . memantine  5 mg Oral BID  . omega-3 acid ethyl esters  2 g Oral BID  . ondansetron  4 mg Oral 4 times per day   Or  . ondansetron (ZOFRAN) IV  4 mg Intravenous 4 times per day  . pantoprazole  40 mg Oral Daily  . risperiDONE  1 mg Oral BID  . senna-docusate  1 tablet Oral Daily  . sodium chloride  3 mL Intravenous Q12H   Continuous Infusions: . sodium chloride 75 mL/hr at 09/06/13 0325   PRN Meds:.clonazePAM, meclizine, oxyCODONE, zolpidem  Assessment/Plan: Pt admitted 09/05/13 with newly recognized Afib with RVR. Multiple medical probs. Recently treated for sepsis and aspiration PNA.   Pt has had generalized weekness with decreased PO intake. H/O nl LVEF by 2D. She denied CP/SOB on admit, only abd pain with palpation today. Her BP is soft With H/O HTN on Hyzaar. Her exam is notable for right carotid bruit, clear lung, cor irreg.  EKG Afib. BNP mildly elevated, K+ slightly low. There has to be a precipitating cause and Dr. Gwenlyn Found suspected she will spontaneously convert. UA is not C/W UTI. BP was to low for IV dilt and rate is currently 80-100. She does not appear to be a coumadin or Noac candidate. Would monitor and Rx conservatively for now.   Principal Problem:   Hypotension Active Problems:   Atrial fibrillation with rapid ventricular response- BP too low for IV dilt.  Rate slowed to 80-100.  Negative troponin. Check Echo HR staying 105 or less. Could add low dose dig if HR does not improve.   Dyslipidemia   Vascular dementia without behavioral disturbance   Hypokalemia-now elevated at 5.1   Nausea & vomiting-CT abd no acute changes.   Hx CVA, on aggrenox since    LOS: 1 day   Time spent with pt. :15 minutes. [facility] R  Nurse Practitioner Certified Pager [phone] or after 5pm and on weekends call [phone] 09/06/2013, 8:02 AM   Attending  Note:   The patient was seen and examined.  Agree with assessment and plan as noted above.  Changes made to the above note as needed.  The patient has A-fib - rate is fairly well controlled this am. She is not a candidate for coumadin or NOAC in my opinion.  She did not speak to me this am.  Sleeping in bed. Will get an echo.  The primary treatment will be fluid resuscitation.  I would not start her on an antiarrhythmic.   Just rate control.    Thayer Headings, Brooke Bonito., MD, [facility] 09/06/2013, 9:39 AM

## 2013-09-06 NOTE — Progress Notes (Signed)
  Echocardiogram 2D Echocardiogram has been performed.  Dorothy Collins 09/06/2013, 2:22 PM

## 2013-09-06 NOTE — Progress Notes (Signed)
[facility] PROGRESS NOTE  Monik B Mendell ZJI:[id number] DOB: 30-Jul-1930 DOA: 09/05/2013 PCP: Ezequiel Kayser, MD  Assessment/Plan: 80 y.o. female from [facility] skilled nursing facility with past history of dementia, history of CVA, COPD, chronic back pain, anxiety, not well ambulatory at baseline (able to walk to bed-bethroom), recent admission for aspiration pneumonia presenting with emesis, weakness, new onset atrial fibrillation with intermittent RVR.  1. Nausea, vomiting of unclear etiology; ? Viral gastroenteritis; abd exam benign; CT abd: no acute findings;  -cont IVF, antiemetics, supportive care   2. A fib RVR; HR stable off meds, on IVF; not a good candidate for anticoagulation due to risk of fall, trauma, bleeding -cont aggrenox; start low dose BB; pend echo; appreciate cardiology input  3. Hypotension, likely dehydration with n/v on top of BP meds/clonidine;  -cont IVF, hold clonidine;  4. COPD, stable; CXR no infiltrates; cont inhalers prn  5. Anxiety on benzo, lethargy on 6/25-resolved likely due to antipsychotics+benzo;  -use benzo prn, decrease the dose is tolerated  6. Dementia, h/o CVA; nto well ambulatory at baseline -cont aggrenox, memantine   Code Status: DNR Family Communication: d/w patient, called Pugh,David Son [phone] no answer, left a message   (indicate person spoken with, relationship, and if by phone, the number) Disposition Plan: SNF 24-48 hrs    Consultants:  Cardiology   Procedures:  Echo pend   Antibiotics:  none (indicate start date, and stop date if known)  HPI/Subjective: alert  Objective: Filed Vitals:   09/06/13 1054  BP: 118/60  Pulse: 98  Temp:   Resp:    No intake or output data in the 24 hours ending 09/06/13 1123 Filed Weights   09/05/13 2115 09/06/13 0552  Weight: 61.689 kg (136 lb) 61.689 kg (136 lb)    Exam:   General:  alert  Cardiovascular: s1,s2 rrr  Respiratory: CTA  BL  Abdomen: soft, nt,nd   Musculoskeletal: no LE edema   Data Reviewed: Basic Metabolic Panel:  Recent Labs Lab 09/05/13 1322 09/05/13 1941 09/06/13 0030  NA 136*  --  134*  K 3.5*  --  5.1  CL 94*  --  98  CO2 24  --  23  GLUCOSE 139*  --  103*  BUN 24*  --  23  CREATININE 0.72 0.83 0.68  CALCIUM 10.0  --  8.7  MG  --  1.8  --    Liver Function Tests:  Recent Labs Lab 09/06/13 0030  AST 21  ALT 12  ALKPHOS 78  BILITOT 0.4  PROT 5.5*  ALBUMIN 2.5*    Recent Labs Lab 09/05/13 1941  LIPASE 29   No results found for this basename: AMMONIA,  in the last 168 hours CBC:  Recent Labs Lab 09/05/13 1322 09/05/13 1941 09/06/13 0030  WBC 11.8* 10.9* 8.8  NEUTROABS  --   --  6.4  HGB 13.9 12.8 11.6*  HCT 40.9 38.0 34.7*  MCV 86.8 88.0 89.2  PLT 346 321 285   Cardiac Enzymes:  Recent Labs Lab 09/05/13 1941 09/06/13 0020 09/06/13 0547  TROPONINI <0.30 <0.30 <0.30   BNP (last 3 results)  Recent Labs  09/05/13 1416  PROBNP 1509.0*   CBG: No results found for this basename: GLUCAP,  in the last 168 hours  No results found for this or any previous visit (from the past 240 hour(s)).   Studies: X-ray Chest Pa And Lateral   09/06/2013   CLINICAL DATA:  80 year old female with weakness in confusion.  Initial encounter.  EXAM: CHEST  2 VIEW  COMPARISON:  09/05/2013 and earlier.  FINDINGS: AP and lateral views of the chest. The patient is rotated to the left. Stable lung volumes in ventilation with no acute or confluent pulmonary opacity identified. No pneumothorax, pleural effusion or edema identified. Stable cardiomegaly and mediastinal contours. Osteopenia.  IMPRESSION: No acute cardiopulmonary abnormality.   Electronically Signed   By: Lars Pinks M.D.   On: 09/06/2013 08:34   Mr Brain Wo Contrast  09/05/2013   CLINICAL DATA:  New onset atrial fibrillation.  Question CVA.  EXAM: MRI HEAD WITHOUT CONTRAST  TECHNIQUE: Multiplanar, multiecho pulse sequences  of the brain and surrounding structures were obtained without intravenous contrast.  COMPARISON:  CT head 08/30/2011.  MRI brain 12/02/2009.  FINDINGS: The diffusion-weighted images demonstrate no evidence for acute or subacute infarction. The study is moderately degraded by patient motion. Extensive periventricular white matter changes are again noted. There is no significant interval change. Moderate generalized atrophy is present. Some white matter changes extend into the brainstem.  Flow present in the vertebrobasilar system. Abnormal signal is present in the right petrous and precavernous carotid artery suggesting high-grade stenosis versus occlusion.  IMPRESSION: 1. No acute intracranial abnormality. 2. Stable atrophy and diffuse white matter disease. This likely reflects the sequelae of chronic microvascular ischemia. 3. Abnormal signal within the right petrous and precavernous carotid artery is new. This may represent a high-grade stenosis or occlusion.   Electronically Signed   By: Lawrence Santiago M.D.   On: 09/05/2013 20:43   Ct Abdomen Pelvis W Contrast  09/05/2013   CLINICAL DATA:  Weakness and nausea for 3 days. Right hip pain. Vomiting. No falls. Abdominal pain.  EXAM: CT ABDOMEN AND PELVIS WITH CONTRAST  TECHNIQUE: Multidetector CT imaging of the abdomen and pelvis was performed using the standard protocol following bolus administration of intravenous contrast.  CONTRAST:  120mL OMNIPAQUE IOHEXOL 300 MG/ML  SOLN  COMPARISON:  None.  FINDINGS: Noncalcified nodule in the anterior right middle lung measuring 4 mm diameter. This is likely benign in a low risk patient.  Surgical absence of the gallbladder. There is moderate intra and extrahepatic bile duct dilatation. This may be due to postoperative physiology. No obstructing mass or stone is identified. Pancreatic duct is not dilated. Diffuse fatty infiltration of the pancreas. No focal liver lesions. Left renal cysts, largest in the upper pole  measuring 7.5 cm diameter. No hydronephrosis in the kidneys. The spleen, adrenal glands, inferior vena cava, and retroperitoneal lymph nodes are unremarkable. Diffuse calcification of the abdominal aorta and iliac arteries. Iliac stenosis is likely. Aorta iliac vessels appear patent. The stomach, small bowel, and colon are not abnormally distended. Contrast material flows through to the colon without evidence of obstruction. Scattered stool in the colon. No free air or free fluid in the abdomen.  Pelvis: Visualization of portions of the pelvis is limited due to streak artifact arising from right hip arthroplasty. The appendix is not identified. No evidence of diverticulitis. No pelvic mass or lymphadenopathy is appreciated. With degenerative changes throughout the lumbar spine. No destructive bone lesions appreciated. Fall  IMPRESSION: Bile duct dilatation is likely due to postoperative physiology. Gallbladder is surgically absent. No focal acute process suggested in the abdomen or pelvis. No evidence of bowel obstruction.   Electronically Signed   By: Lucienne Capers M.D.   On: 09/05/2013 23:27   Dg Chest Port 1 View  09/05/2013   CLINICAL DATA:  Weakness, fatigue for 1  day, history hypertension, stroke, COPD, LEFT breast cancer, former smoker  EXAM: PORTABLE CHEST - 1 VIEW  COMPARISON:  Portable exam 1331 hr compared to 08/17/2013  FINDINGS: Rotated to the LEFT.  Normal heart size, mediastinal contours and pulmonary vascularity.  Atherosclerotic calcification aorta.  Lungs emphysematous but clear.  No pleural effusion or pneumothorax.  No acute osseous findings.  IMPRESSION: Emphysematous changes without acute infiltrate.   Electronically Signed   By: Lavonia Dana M.D.   On: 09/05/2013 13:48    Scheduled Meds: . antiseptic oral rinse  15 mL Mouth Rinse BID  . azelastine  1 spray Each Nare BID  . cycloSPORINE  1 drop Both Eyes BID  . dipyridamole-aspirin  1 capsule Oral BID  . DULoxetine  90 mg Oral  Daily  . gabapentin  400 mg Oral TID  . heparin  5,000 Units Subcutaneous 3 times per day  . ketotifen  1 drop Both Eyes BID  . memantine  5 mg Oral BID  . omega-3 acid ethyl esters  2 g Oral BID  . ondansetron  4 mg Oral 4 times per day   Or  . ondansetron (ZOFRAN) IV  4 mg Intravenous 4 times per day  . pantoprazole  40 mg Oral Daily  . risperiDONE  1 mg Oral BID  . senna-docusate  1 tablet Oral Daily  . sodium chloride  3 mL Intravenous Q12H   Continuous Infusions: . sodium chloride 75 mL/hr at 09/06/13 3295    Principal Problem:   Hypotension Active Problems:   Dyslipidemia   Vascular dementia without behavioral disturbance   Atrial fibrillation with rapid ventricular response   Hypokalemia   Nausea & vomiting   Emesis    Time spent: >35 minutes     Kinnie Feil  [facility] Pager [phone]. If 7PM-7AM, please contact night-coverage at [email or web address], password [facility] 09/06/2013, 11:23 AM  LOS: 1 day

## 2013-09-06 NOTE — Progress Notes (Signed)
Utilization review completed.  

## 2013-09-07 DIAGNOSIS — I4891 Unspecified atrial fibrillation: Secondary | ICD-10-CM | POA: Diagnosis present

## 2013-09-07 LAB — CBC WITH DIFFERENTIAL/PLATELET
Basophils Absolute: 0 10*3/uL (ref 0.0–0.1)
Basophils Relative: 0 % (ref 0–1)
EOS PCT: 2 % (ref 0–5)
Eosinophils Absolute: 0.2 10*3/uL (ref 0.0–0.7)
HCT: 34.5 % — ABNORMAL LOW (ref 36.0–46.0)
HEMOGLOBIN: 11.4 g/dL — AB (ref 12.0–15.0)
LYMPHS ABS: 1.5 10*3/uL (ref 0.7–4.0)
Lymphocytes Relative: 18 % (ref 12–46)
MCH: 29.6 pg (ref 26.0–34.0)
MCHC: 33 g/dL (ref 30.0–36.0)
MCV: 89.6 fL (ref 78.0–100.0)
Monocytes Absolute: 0.5 10*3/uL (ref 0.1–1.0)
Monocytes Relative: 7 % (ref 3–12)
Neutro Abs: 6.1 10*3/uL (ref 1.7–7.7)
Neutrophils Relative %: 73 % (ref 43–77)
Platelets: 240 10*3/uL (ref 150–400)
RBC: 3.85 MIL/uL — AB (ref 3.87–5.11)
RDW: 13.9 % (ref 11.5–15.5)
WBC: 8.3 10*3/uL (ref 4.0–10.5)

## 2013-09-07 LAB — COMPREHENSIVE METABOLIC PANEL
ALBUMIN: 2.6 g/dL — AB (ref 3.5–5.2)
ALT: 12 U/L (ref 0–35)
AST: 25 U/L (ref 0–37)
Alkaline Phosphatase: 79 U/L (ref 39–117)
BUN: 21 mg/dL (ref 6–23)
CO2: 24 mEq/L (ref 19–32)
CREATININE: 0.66 mg/dL (ref 0.50–1.10)
Calcium: 9.2 mg/dL (ref 8.4–10.5)
Chloride: 97 mEq/L (ref 96–112)
GFR calc Af Amer: >90 mL/min (ref >90)
GFR calc non Af Amer: 81 mL/min — ABNORMAL LOW (ref >90)
Glucose, Bld: 99 mg/dL (ref 70–99)
POTASSIUM: 4.4 meq/L (ref 3.7–5.3)
Sodium: 134 mEq/L — ABNORMAL LOW (ref 137–147)
TOTAL PROTEIN: 5.9 g/dL — AB (ref 6.0–8.3)
Total Bilirubin: 0.3 mg/dL (ref 0.3–1.2)

## 2013-09-07 MED ORDER — RISPERIDONE 1 MG PO TBDP: 0.5000 mg | ORAL_TABLET | Freq: Two times a day (BID) | ORAL | Status: DC

## 2013-09-07 MED ORDER — CLONAZEPAM 0.5 MG PO TABS: 0.5000 mg | ORAL_TABLET | Freq: Three times a day (TID) | ORAL | Status: AC | PRN

## 2013-09-07 MED ORDER — METOPROLOL TARTRATE 12.5 MG HALF TABLET
12.5000 mg | ORAL_TABLET | Freq: Two times a day (BID) | ORAL | Status: DC
  Administered 2013-09-07 (×2): 12.5 mg via ORAL
  Filled 2013-09-07 (×4): qty 1

## 2013-09-07 MED ORDER — GABAPENTIN 400 MG PO CAPS: 200.0000 mg | ORAL_CAPSULE | Freq: Three times a day (TID) | ORAL | Status: AC

## 2013-09-07 MED ORDER — RISPERIDONE 0.5 MG PO TABS
0.5000 mg | ORAL_TABLET | Freq: Two times a day (BID) | ORAL | Status: DC
  Administered 2013-09-07 (×2): 0.5 mg via ORAL
  Filled 2013-09-07 (×4): qty 1

## 2013-09-07 MED ORDER — OXYCODONE HCL 5 MG PO TABS: 5.0000 mg | ORAL_TABLET | ORAL | Status: DC | PRN

## 2013-09-07 MED ORDER — DULOXETINE HCL 60 MG PO CPEP
60.0000 mg | ORAL_CAPSULE | Freq: Every day | ORAL | Status: DC
  Administered 2013-09-07: 60 mg via ORAL
  Filled 2013-09-07 (×2): qty 1

## 2013-09-07 MED ORDER — GABAPENTIN 100 MG PO CAPS
100.0000 mg | ORAL_CAPSULE | Freq: Three times a day (TID) | ORAL | Status: DC
  Administered 2013-09-07 (×3): 100 mg via ORAL
  Filled 2013-09-07 (×6): qty 1

## 2013-09-07 MED ORDER — DULOXETINE HCL 30 MG PO CPEP: 60.0000 mg | ORAL_CAPSULE | Freq: Every day | ORAL | Status: AC

## 2013-09-07 MED ORDER — METOPROLOL TARTRATE 12.5 MG HALF TABLET: 12.5000 mg | ORAL_TABLET | Freq: Two times a day (BID) | ORAL | Status: DC

## 2013-09-07 NOTE — Progress Notes (Signed)
CSW (Clinical Education officer, museum) informed pt will not dc today. CSW called Elzie Rings with facility and informed. She confirmed pt can dc over weekend back to [facility]. MD aware.  [place], [place]

## 2013-09-07 NOTE — Progress Notes (Signed)
Subjective: Lethargic, wakes and moans but does not answer questions.  RN stated pt had trazadone last pm  Objective: Vital signs in last 24 hours: Temp:  [97.5 F (36.4 C)-98.1 F (36.7 C)] 97.5 F (36.4 C) (06/26 0500) Pulse Rate:  [84-98] 94 (06/26 0500) Resp:  [18] 18 (06/26 0500) BP: (79-126)/(35-73) 126/68 mmHg (06/26 0500) SpO2:  [94 %-98 %] 98 % (06/26 0500) Weight:  [139 lb 8 oz (63.277 kg)] 139 lb 8 oz (63.277 kg) (06/26 0500) Weight change: 3 lb 8 oz (1.588 kg) Last BM Date: 09/04/13 Intake/Output from previous day:  Unable to do 06/25 0701 - 06/26 0700 In: 1152.5 [I.V.:1152.5] Out: -      PE: General:lethargic, NAD Skin:Warm and dry, brisk capillary refill, color pale HEENT:normocephalic, sclera clear, mucus membranes moist Heart:irreg irreg without murmur, gallup, rub or click Lungs:clear,  without rales, rhonchi, or wheezes KGM:WNUU, + tenderness, + BS, do not palpate liver spleen or masses Ext:tr lower ext edema,  Neuro:lethargic, attempts to answer questions, MAE, follows commands, + facial symmetry   Lab Results:  Recent Labs  09/06/13 0030 09/07/13 0505  WBC 8.8 8.3  HGB 11.6* 11.4*  HCT 34.7* 34.5*  PLT 285 240   BMET  Recent Labs  09/06/13 0030 09/07/13 0505  NA 134* 134*  K 5.1 4.4  CL 98 97  CO2 23 24  GLUCOSE 103* 99  BUN 23 21  CREATININE 0.68 0.66  CALCIUM 8.7 9.2    Recent Labs  09/06/13 0020 09/06/13 0547  TROPONINI <0.30 <0.30     Lab Results  Component Value Date   HGBA1C 6.2* 09/05/2013     Lab Results  Component Value Date   TSH 1.960 09/05/2013    Hepatic Function Panel  Recent Labs  09/07/13 0505  PROT 5.9*  ALBUMIN 2.6*  AST 25  ALT 12  ALKPHOS 79  BILITOT 0.3    Studies/Results: X-ray Chest Pa And Lateral   09/06/2013   CLINICAL DATA:  80 year old female with weakness in confusion. Initial encounter.  EXAM: CHEST  2 VIEW  COMPARISON:  09/05/2013 and earlier.  FINDINGS: AP and lateral  views of the chest. The patient is rotated to the left. Stable lung volumes in ventilation with no acute or confluent pulmonary opacity identified. No pneumothorax, pleural effusion or edema identified. Stable cardiomegaly and mediastinal contours. Osteopenia.  IMPRESSION: No acute cardiopulmonary abnormality.   Electronically Signed   By: Lars Pinks M.D.   On: 09/06/2013 08:34   Mr Brain Wo Contrast  09/05/2013   CLINICAL DATA:  New onset atrial fibrillation.  Question CVA.  EXAM: MRI HEAD WITHOUT CONTRAST  TECHNIQUE: Multiplanar, multiecho pulse sequences of the brain and surrounding structures were obtained without intravenous contrast.  COMPARISON:  CT head 08/30/2011.  MRI brain 12/02/2009.  FINDINGS: The diffusion-weighted images demonstrate no evidence for acute or subacute infarction. The study is moderately degraded by patient motion. Extensive periventricular white matter changes are again noted. There is no significant interval change. Moderate generalized atrophy is present. Some white matter changes extend into the brainstem.  Flow present in the vertebrobasilar system. Abnormal signal is present in the right petrous and precavernous carotid artery suggesting high-grade stenosis versus occlusion.  IMPRESSION: 1. No acute intracranial abnormality. 2. Stable atrophy and diffuse white matter disease. This likely reflects the sequelae of chronic microvascular ischemia. 3. Abnormal signal within the right petrous and precavernous carotid artery is new. This may represent a high-grade stenosis or occlusion.  Electronically Signed   By: Lawrence Santiago M.D.   On: 09/05/2013 20:43   Ct Abdomen Pelvis W Contrast  09/05/2013   CLINICAL DATA:  Weakness and nausea for 3 days. Right hip pain. Vomiting. No falls. Abdominal pain.  EXAM: CT ABDOMEN AND PELVIS WITH CONTRAST  TECHNIQUE: Multidetector CT imaging of the abdomen and pelvis was performed using the standard protocol following bolus administration of  intravenous contrast.  CONTRAST:  168mL OMNIPAQUE IOHEXOL 300 MG/ML  SOLN  COMPARISON:  None.  FINDINGS: Noncalcified nodule in the anterior right middle lung measuring 4 mm diameter. This is likely benign in a low risk patient.  Surgical absence of the gallbladder. There is moderate intra and extrahepatic bile duct dilatation. This may be due to postoperative physiology. No obstructing mass or stone is identified. Pancreatic duct is not dilated. Diffuse fatty infiltration of the pancreas. No focal liver lesions. Left renal cysts, largest in the upper pole measuring 7.5 cm diameter. No hydronephrosis in the kidneys. The spleen, adrenal glands, inferior vena cava, and retroperitoneal lymph nodes are unremarkable. Diffuse calcification of the abdominal aorta and iliac arteries. Iliac stenosis is likely. Aorta iliac vessels appear patent. The stomach, small bowel, and colon are not abnormally distended. Contrast material flows through to the colon without evidence of obstruction. Scattered stool in the colon. No free air or free fluid in the abdomen.  Pelvis: Visualization of portions of the pelvis is limited due to streak artifact arising from right hip arthroplasty. The appendix is not identified. No evidence of diverticulitis. No pelvic mass or lymphadenopathy is appreciated. With degenerative changes throughout the lumbar spine. No destructive bone lesions appreciated. Fall  IMPRESSION: Bile duct dilatation is likely due to postoperative physiology. Gallbladder is surgically absent. No focal acute process suggested in the abdomen or pelvis. No evidence of bowel obstruction.   Electronically Signed   By: Lucienne Capers M.D.   On: 09/05/2013 23:27   Dg Chest Port 1 View  09/05/2013   CLINICAL DATA:  Weakness, fatigue for 1 day, history hypertension, stroke, COPD, LEFT breast cancer, former smoker  EXAM: PORTABLE CHEST - 1 VIEW  COMPARISON:  Portable exam 1331 hr compared to 08/17/2013  FINDINGS: Rotated to the  LEFT.  Normal heart size, mediastinal contours and pulmonary vascularity.  Atherosclerotic calcification aorta.  Lungs emphysematous but clear.  No pleural effusion or pneumothorax.  No acute osseous findings.  IMPRESSION: Emphysematous changes without acute infiltrate.   Electronically Signed   By: Lavonia Dana M.D.   On: 09/05/2013 13:48    Medications: I have reviewed the patient's current medications. Scheduled Meds: . antiseptic oral rinse  15 mL Mouth Rinse BID  . azelastine  1 spray Each Nare BID  . cycloSPORINE  1 drop Both Eyes BID  . dipyridamole-aspirin  1 capsule Oral BID  . heparin  5,000 Units Subcutaneous 3 times per day  . ketotifen  1 drop Both Eyes BID  . memantine  5 mg Oral BID  . omega-3 acid ethyl esters  2 g Oral BID  . ondansetron  4 mg Oral 4 times per day   Or  . ondansetron (ZOFRAN) IV  4 mg Intravenous 4 times per day  . pantoprazole  40 mg Oral Daily  . senna-docusate  1 tablet Oral Daily  . sodium chloride  3 mL Intravenous Q12H   Continuous Infusions: . sodium chloride 75 mL/hr at 09/06/13 0325   PRN Meds:.clonazePAM, meclizine, oxyCODONE  Assessment/Plan: Pt admitted 09/05/13 with newly  recognized Afib with RVR. Multiple medical probs. Recently treated for sepsis and aspiration PNA.   Pt has had generalized weekness with decreased PO intake. H/O nl LVEF by 2D. She denied CP/SOB on admit, only abd pain with palpation today. Her BP is soft With H/O HTN on Hyzaar. Her exam is notable for right carotid bruit, clear lung, cor irreg. EKG Afib. BNP mildly elevated, K+ slightly low. There has to be a precipitating cause and Dr. Gwenlyn Found suspected she will spontaneously convert. UA is not C/W UTI. BP was to low for IV dilt and rate is currently 80-100. She does not appear to be a coumadin or Noac candidate. Would monitor and Rx conservatively for now.   Principal Problem:   Hypotension Active Problems:   Atrial fibrillation with rapid ventricular response- BP too low  for IV dilt.  Rate slowed to 80-100.  Negative troponin.   Echo - normal LV function - EF 55%,  ? Of mild AS - no need to repeat echo.  Trivial TR.   HR staying 105 or less.   Dyslipidemia   Vascular dementia without behavioral disturbance   Hypokalemia-now elevated at 5.1   Nausea & vomiting-CT abd no acute changes.   Hx CVA, on aggrenox since  No further cardiology recs.  Continue rate control. Will sign off. Call for questions   Ramond Dial., MD, [facility] 09/07/2013, 9:13 AM

## 2013-09-07 NOTE — Progress Notes (Signed)
Clinical Social Work Department BRIEF PSYCHOSOCIAL ASSESSMENT 09/07/2013  Patient:  Dorothy Collins, Dorothy Collins     Account Number:  [id number]     Admit date:  09/05/2013  Clinical Social Worker:  Adair Laundry  Date/Time:  09/07/2013 10:00 AM  Referred by:  Physician  Date Referred:  09/07/2013 Referred for  SNF Placement   Other Referral:   Interview type:  Patient Other interview type:    PSYCHOSOCIAL DATA Living Status:  FACILITY Admitted from facility:  [facility] Level of care:  [place] Primary support name:  Abe People Primary support relationship to patient:  CHILD, ADULT Degree of support available:   Pt has good support    CURRENT CONCERNS Current Concerns  Post-Acute Placement   Other Concerns:    SOCIAL WORK ASSESSMENT / PLAN CSW informed pt was admitted from facility. CSW visited pt room and pt confirmed she was admitted from [facility]. Pt lives at facility ALF but was at [facility] prior to admission and plans to return to SNF at dc. CSW did inform pt about potential for dc today. Pt concerned about being discharged so quickly. CSW offered support and advised pt to speak with MD. Pt wanting CSW to wait to call family once dc is decided.    CSW spoke with Elzie Rings at [facility]. She confirmed pt is okay to return to SNF today.   Assessment/plan status:  Psychosocial Support/Ongoing Assessment of Needs Other assessment/ plan:   Information/referral to community resources:   None needed    PATIENT'S/FAMILY'S RESPONSE TO PLAN OF CARE: Pt is agreeable to returning to SNF at [facility], [place]

## 2013-09-07 NOTE — Progress Notes (Signed)
[facility] PROGRESS NOTE  Dorothy Collins TML:[id number] DOB: October 09, 1930 DOA: 09/05/2013 PCP: Ezequiel Kayser, MD  Assessment/Plan: 80 y.o. female from [facility] with past history of dementia, history of CVA, COPD, chronic back pain, anxiety, not well ambulatory at baseline (able to walk to bed-bethroom), recent admission for aspiration pneumonia presenting with emesis, weakness, new onset atrial fibrillation with intermittent RVR.  1. Nausea, vomiting of unclear etiology; ? Viral gastroenteritis; abd exam benign; CT abd: no acute findings;  -resolved; on antiemetics, supportive care   2. A fib RVR; HR stable off meds; not a good candidate for anticoagulation due to risk of fall, trauma, bleeding -cont aggrenox; start low dose BB ; -echo: normal LV function - EF 55%, ? Of mild AS - no need to repeat echo per cards; Trivial TR. appreciate cardiology input  3. Hypotension, likely dehydration with n/v on top of BP meds/clonidine;  -improved on IVF, hold clonidine; Hyzaar; cont BB  4. COPD, stable; CXR no infiltrates; cont inhalers prn  5. Anxiety on benzo, lethargy on 6/25-resolved likely due to antipsychotics+benzo, SSRI;  -use benzo prn, decrease the dose of antipsychotics, ssri as tolerated, resume antipsychotics;  6. Dementia, h/o CVA; nto well ambulatory at baseline, behavioral changes  -cont aggrenox, memantine   Code Status: DNR Family Communication: d/w patient, called Pugh,David Son [phone] no answer, left a message   (indicate person spoken with, relationship, and if by phone, the number) Disposition Plan: SNF 24-48 hrs    Consultants:  Cardiology   Procedures:  Echo pend   Antibiotics:  none (indicate start date, and stop date if known)  HPI/Subjective: alert  Objective: Filed Vitals:   09/07/13 0500  BP: 126/68  Pulse: 94  Temp: 97.5 F (36.4 C)  Resp: 18    Intake/Output Summary (Last 24  hours) at 09/07/13 1024 Last data filed at 09/06/13 1847  Gross per 24 hour  Intake 1152.5 ml  Output      0 ml  Net 1152.5 ml   Filed Weights   09/05/13 2115 09/06/13 0552 09/07/13 0500  Weight: 61.689 kg (136 lb) 61.689 kg (136 lb) 63.277 kg (139 lb 8 oz)    Exam:   General:  alert  Cardiovascular: s1,s2 rrr  Respiratory: CTA BL  Abdomen: soft, nt,nd   Musculoskeletal: no LE edema   Data Reviewed: Basic Metabolic Panel:  Recent Labs Lab 09/05/13 1322 09/05/13 1941 09/06/13 0030 09/07/13 0505  NA 136*  --  134* 134*  K 3.5*  --  5.1 4.4  CL 94*  --  98 97  CO2 24  --  23 24  GLUCOSE 139*  --  103* 99  BUN 24*  --  23 21  CREATININE 0.72 0.83 0.68 0.66  CALCIUM 10.0  --  8.7 9.2  MG  --  1.8  --   --    Liver Function Tests:  Recent Labs Lab 09/06/13 0030 09/07/13 0505  AST 21 25  ALT 12 12  ALKPHOS 78 79  BILITOT 0.4 0.3  PROT 5.5* 5.9*  ALBUMIN 2.5* 2.6*    Recent Labs Lab 09/05/13 1941  LIPASE 29   No results found for this basename: AMMONIA,  in the last 168 hours CBC:  Recent Labs Lab 09/05/13 1322 09/05/13 1941 09/06/13 0030 09/07/13 0505  WBC 11.8* 10.9* 8.8 8.3  NEUTROABS  --   --  6.4 6.1  HGB 13.9 12.8 11.6* 11.4*  HCT 40.9 38.0 34.7* 34.5*  MCV 86.8 88.0 89.2 89.6  PLT 346 321 285 240   Cardiac Enzymes:  Recent Labs Lab 09/05/13 1941 09/06/13 0020 09/06/13 0547  TROPONINI <0.30 <0.30 <0.30   BNP (last 3 results)  Recent Labs  09/05/13 1416  PROBNP 1509.0*   CBG: No results found for this basename: GLUCAP,  in the last 168 hours  No results found for this or any previous visit (from the past 240 hour(s)).   Studies: X-ray Chest Pa And Lateral   09/06/2013   CLINICAL DATA:  80 year old female with weakness in confusion. Initial encounter.  EXAM: CHEST  2 VIEW  COMPARISON:  09/05/2013 and earlier.  FINDINGS: AP and lateral views of the chest. The patient is rotated to the left. Stable lung volumes in  ventilation with no acute or confluent pulmonary opacity identified. No pneumothorax, pleural effusion or edema identified. Stable cardiomegaly and mediastinal contours. Osteopenia.  IMPRESSION: No acute cardiopulmonary abnormality.   Electronically Signed   By: Lars Pinks M.D.   On: 09/06/2013 08:34   Mr Brain Wo Contrast  09/05/2013   CLINICAL DATA:  New onset atrial fibrillation.  Question CVA.  EXAM: MRI HEAD WITHOUT CONTRAST  TECHNIQUE: Multiplanar, multiecho pulse sequences of the brain and surrounding structures were obtained without intravenous contrast.  COMPARISON:  CT head 08/30/2011.  MRI brain 12/02/2009.  FINDINGS: The diffusion-weighted images demonstrate no evidence for acute or subacute infarction. The study is moderately degraded by patient motion. Extensive periventricular white matter changes are again noted. There is no significant interval change. Moderate generalized atrophy is present. Some white matter changes extend into the brainstem.  Flow present in the vertebrobasilar system. Abnormal signal is present in the right petrous and precavernous carotid artery suggesting high-grade stenosis versus occlusion.  IMPRESSION: 1. No acute intracranial abnormality. 2. Stable atrophy and diffuse white matter disease. This likely reflects the sequelae of chronic microvascular ischemia. 3. Abnormal signal within the right petrous and precavernous carotid artery is new. This may represent a high-grade stenosis or occlusion.   Electronically Signed   By: Lawrence Santiago M.D.   On: 09/05/2013 20:43   Ct Abdomen Pelvis W Contrast  09/05/2013   CLINICAL DATA:  Weakness and nausea for 3 days. Right hip pain. Vomiting. No falls. Abdominal pain.  EXAM: CT ABDOMEN AND PELVIS WITH CONTRAST  TECHNIQUE: Multidetector CT imaging of the abdomen and pelvis was performed using the standard protocol following bolus administration of intravenous contrast.  CONTRAST:  152mL OMNIPAQUE IOHEXOL 300 MG/ML  SOLN   COMPARISON:  None.  FINDINGS: Noncalcified nodule in the anterior right middle lung measuring 4 mm diameter. This is likely benign in a low risk patient.  Surgical absence of the gallbladder. There is moderate intra and extrahepatic bile duct dilatation. This may be due to postoperative physiology. No obstructing mass or stone is identified. Pancreatic duct is not dilated. Diffuse fatty infiltration of the pancreas. No focal liver lesions. Left renal cysts, largest in the upper pole measuring 7.5 cm diameter. No hydronephrosis in the kidneys. The spleen, adrenal glands, inferior vena cava, and retroperitoneal lymph nodes are unremarkable. Diffuse calcification of the abdominal aorta and iliac arteries. Iliac stenosis is likely. Aorta iliac vessels appear patent. The stomach, small bowel, and colon are not abnormally distended. Contrast material flows through to the colon without evidence of obstruction. Scattered stool in the colon. No free air or free fluid in the abdomen.  Pelvis: Visualization of portions of the pelvis is limited due to streak artifact  arising from right hip arthroplasty. The appendix is not identified. No evidence of diverticulitis. No pelvic mass or lymphadenopathy is appreciated. With degenerative changes throughout the lumbar spine. No destructive bone lesions appreciated. Fall  IMPRESSION: Bile duct dilatation is likely due to postoperative physiology. Gallbladder is surgically absent. No focal acute process suggested in the abdomen or pelvis. No evidence of bowel obstruction.   Electronically Signed   By: Lucienne Capers M.D.   On: 09/05/2013 23:27   Dg Chest Port 1 View  09/05/2013   CLINICAL DATA:  Weakness, fatigue for 1 day, history hypertension, stroke, COPD, LEFT breast cancer, former smoker  EXAM: PORTABLE CHEST - 1 VIEW  COMPARISON:  Portable exam 1331 hr compared to 08/17/2013  FINDINGS: Rotated to the LEFT.  Normal heart size, mediastinal contours and pulmonary vascularity.   Atherosclerotic calcification aorta.  Lungs emphysematous but clear.  No pleural effusion or pneumothorax.  No acute osseous findings.  IMPRESSION: Emphysematous changes without acute infiltrate.   Electronically Signed   By: Lavonia Dana M.D.   On: 09/05/2013 13:48    Scheduled Meds: . antiseptic oral rinse  15 mL Mouth Rinse BID  . azelastine  1 spray Each Nare BID  . cycloSPORINE  1 drop Both Eyes BID  . dipyridamole-aspirin  1 capsule Oral BID  . heparin  5,000 Units Subcutaneous 3 times per day  . ketotifen  1 drop Both Eyes BID  . memantine  5 mg Oral BID  . omega-3 acid ethyl esters  2 g Oral BID  . ondansetron  4 mg Oral 4 times per day   Or  . ondansetron (ZOFRAN) IV  4 mg Intravenous 4 times per day  . pantoprazole  40 mg Oral Daily  . senna-docusate  1 tablet Oral Daily  . sodium chloride  3 mL Intravenous Q12H   Continuous Infusions: . sodium chloride 75 mL/hr at 09/06/13 3568    Principal Problem:   Hypotension Active Problems:   Dyslipidemia   Vascular dementia without behavioral disturbance   Atrial fibrillation with rapid ventricular response   Hypokalemia   Nausea & vomiting   Emesis    Time spent: >35 minutes     Kinnie Feil  [facility] Pager [phone]. If 7PM-7AM, please contact night-coverage at [email or web address], password [facility] 09/07/2013, 10:24 AM  LOS: 2 days

## 2013-09-07 NOTE — Progress Notes (Addendum)
Patient ID: Jeanett B Schrodt, female   DOB: [birth year], 80 y.o.   MRN: [id number]                  PROGRESS NOTE  DATE:  08/29/2013    FACILITY: [facility]    LEVEL OF CARE:   SNF   Acute Visit   CHIEF COMPLAINT:  Wound review.    HISTORY OF PRESENT ILLNESS:  Mrs. Hefferan is a patient who was recently in the hospital with sepsis and aspiration pneumonitis.  She  apparently has come back with a coccyx wound and I have been asked to look at this today.    PHYSICAL EXAMINATION:   SKIN:  INSPECTION:  Indeed, over an area of her coccyx in the proximal gluteal folds is a fairly large wound.  This is covered with an adherent eschar.  There does not appear to be any gross infection around this.  There is no crepitus.  Nevertheless, this is in a very difficult spot.    ASSESSMENT/PLAN:  Pressure wound, coccyx, as described.  This is covered with an eschar.  She was not in a condition today for me to attempt to debride this, at least mechanically.  We will use Santyl and a foam cover.  It is paramount that we have pressure relief to this area.  She is not a very compliant patient.  We will try to position her off this when she is in the chair and in bed.

## 2013-09-08 DIAGNOSIS — K219 Gastro-esophageal reflux disease without esophagitis: Secondary | ICD-10-CM

## 2013-09-08 NOTE — Clinical Social Work Note (Signed)
CSW made aware by RN patient ready for d/c to [facility]. CSW contacted facility and spoke with [place], [place] confirmed patient return to facility. CSW faxed d/c summary to facility. CSW met with patient who is agreeable to d/c plan. Per patient, she has been aware of d/c and desires to eat lunch at [facility] prior to returning to facility. Patient stated family is aware of d/c. CSW made RN, Janett Billow aware of report and room number. CSW prepared d/c packet and placed in patient's shadow chart. CSW to arrange transportation via [place]. No further needs. CSW signing off.  [place]agner, Patrick Weekend Clinical Social Worker [phone]

## 2013-09-08 NOTE — Progress Notes (Signed)
Patient refused all po am medications and her nasal spray.  Dr. Daleen Bo notified.

## 2013-09-08 NOTE — Discharge Summary (Signed)
Physician Discharge Summary  Dorothy Collins FMB:[id number] DOB: [birth year] DOA: 09/05/2013  PCP: Ezequiel Kayser, MD  Admit date: 09/05/2013 Discharge date: 09/08/2013  Time spent: >35 minutes  Recommendations for Outpatient Follow-up:  SNF F/u with psychiatry in 2-3 weeks  F/u with PCP in 1-2 weeks  Discharge Diagnoses:  Principal Problem:   Hypotension Active Problems:   Dyslipidemia   Vascular dementia without behavioral disturbance   Atrial fibrillation with rapid ventricular response   Hypokalemia   Nausea & vomiting   Emesis   A-fib   Discharge Condition: stable   Diet recommendation: low soidum   Filed Weights   09/06/13 0552 09/07/13 0500 09/08/13 0448  Weight: 61.689 kg (136 lb) 63.277 kg (139 lb 8 oz) 58.877 kg (129 lb 12.8 oz)    History of present illness:  80 y.o. female from [facility] skilled nursing facility with past history of dementia, history of CVA, COPD, chronic back pain, anxiety, not well ambulatory at baseline (able to walk to bed-bethroom), recent admission for aspiration pneumonia presenting with emesis, weakness, new onset atrial fibrillation with intermittent RVR.    Hospital Course:  1. Nausea, vomiting of unclear etiology; ? Viral gastroenteritis; abd exam benign; CT abd: no acute findings;  -resolved; on antiemetics, supportive care; Pt reports GERD, cont PPI  2. A fib RVR; HR stable off meds; not a good candidate for anticoagulation due to risk of fall, trauma, bleeding  -cont aggrenox; start low dose BB ;  -echo: normal LV function - EF 55%, ? Of mild AS - no need to repeat echo per cards; Trivial TR. appreciate cardiology input  3. Hypotension, likely dehydration with n/v on top of BP meds/clonidine;  -improved on IVF, hold clonidine; Hyzaar; cont BB, adjust meds at SNF as needed 4. COPD, stable; CXR no infiltrates; cont inhalers prn  5. Anxiety on benzo, lethargy on 6/25-resolved likely due to antipsychotics+benzo, SSRI;  -use benzo  prn, decrease the dose of antipsychotics, ssri as tolerated, resume antipsychotics;  6. Dementia, h/o CVA; not well ambulatory at baseline, behavioral changes  -cont aggrenox, memantine  7. Called d/w her son Pugh,David who told me that  Pt has multiple psych issues, schizophrenia, anxiety,hystrionic, manipulative behavioral changes;   -f/u with continuous psychiatry evaluation at SNF     Procedures:  Echo'  (i.e. Studies not automatically included, echos, thoracentesis, etc; not x-rays)  Consultations:  Cardiology   Discharge Exam: Filed Vitals:   09/08/13 0448  BP: 136/67  Pulse: 82  Temp: 98.6 F (37 C)  Resp: 18    General: alert Cardiovascular: s1,s2 rrr Respiratory: CTA BL  Discharge Instructions  Discharge Instructions   Diet - low sodium heart healthy    Complete by:  As directed      Discharge instructions    Complete by:  As directed   Please follow up with primary care doctor in 1 week     Increase activity slowly    Complete by:  As directed             Medication List    STOP taking these medications       cloNIDine 0.1 MG tablet  Commonly known as:  CATAPRES     losartan-hydrochlorothiazide 100-25 MG per tablet  Commonly known as:  HYZAAR     zolpidem 5 MG tablet  Commonly known as:  AMBIEN      TAKE these medications       antiseptic oral rinse Liqd  15 mLs by Mouth Rinse  route 2 (two) times daily. Rinse mouth and spit     azelastine 0.1 % nasal spray  Commonly known as:  ASTELIN  Place 1 spray into both nostrils 2 (two) times daily. Use in each nostril as directed for uncontrolled rhinorrhea.     bimatoprost 0.01 % Soln  Commonly known as:  LUMIGAN  Place 1 drop into both eyes daily.     calcium-vitamin D 500-200 MG-UNIT per tablet  Commonly known as:  OSCAL WITH D  Take 1 tablet by mouth 2 (two) times daily.     Cholecalciferol 2000 UNITS Caps  Take 2,000 Units by mouth daily.     cholecalciferol 400 UNITS Tabs tablet   Commonly known as:  VITAMIN D  Take 400 Units by mouth 2 (two) times daily.     clonazePAM 0.5 MG tablet  Commonly known as:  KLONOPIN  Take 1 tablet (0.5 mg total) by mouth 3 (three) times daily as needed for anxiety.     cycloSPORINE 0.05 % ophthalmic emulsion  Commonly known as:  RESTASIS  Place 1 drop into both eyes 2 (two) times daily.     dipyridamole-aspirin 200-25 MG per 12 hr capsule  Commonly known as:  AGGRENOX  Take 1 capsule by mouth 2 (two) times daily.     DULoxetine 30 MG capsule  Commonly known as:  CYMBALTA  Take 2 capsules (60 mg total) by mouth daily. Takes with 60 mg for total of 90mg      fexofenadine 180 MG tablet  Commonly known as:  ALLEGRA  Take 180 mg by mouth daily.     gabapentin 400 MG capsule  Commonly known as:  NEURONTIN  Take 1 capsule (400 mg total) by mouth 3 (three) times daily.     ketotifen 0.025 % ophthalmic solution  Commonly known as:  ZADITOR  Place 1 drop into both eyes 2 (two) times daily. Wait 3-5 minutes between eye meds     lidocaine 5 %  Commonly known as:  LIDODERM  Place 1 patch onto the skin daily. Apply to back  -  Remove & Discard patch within 12 hours or as directed by MD     meclizine 25 MG tablet  Commonly known as:  ANTIVERT  Take 25 mg by mouth every 6 (six) hours as needed for dizziness.     memantine 5 MG tablet  Commonly known as:  NAMENDA  Take 5 mg by mouth 2 (two) times daily.     metoprolol tartrate 12.5 mg Tabs tablet  Commonly known as:  LOPRESSOR  Take 0.5 tablets (12.5 mg total) by mouth 2 (two) times daily.     omega-3 acid ethyl esters 1 G capsule  Commonly known as:  LOVAZA  Take 2 g by mouth 2 (two) times daily.     omeprazole 20 MG capsule  Commonly known as:  PRILOSEC  Take 40 mg by mouth 2 (two) times daily before a meal.     oxyCODONE 5 MG immediate release tablet  Commonly known as:  Oxy IR/ROXICODONE  Take 1 tablet (5 mg total) by mouth every 4 (four) hours as needed for severe  pain.     risperiDONE 1 MG disintegrating tablet  Commonly known as:  RISPERDAL M-TABS  Take 0.5 tablets (0.5 mg total) by mouth 2 (two) times daily.     sennosides-docusate sodium 8.6-50 MG tablet  Commonly known as:  SENOKOT-S  Take 1 tablet by mouth daily.     SIMBRINZA 1-0.2 %  Susp  Generic drug:  Brinzolamide-Brimonidine  Place 1 drop into both eyes 2 (two) times daily.     SYSTANE OP  Apply 1 drop to eye every 2 (two) hours.     ZOLMitriptan 2.5 MG tablet  Commonly known as:  ZOMIG  Take 2.5 mg by mouth as needed. For migraine       Allergies  Allergen Reactions  . Lyrica [Pregabalin]     unk  . Morphine And Related     unk  . Penicillins     unk  . Sulfa Antibiotics     unk       Follow-up Information   Follow up with [facility], MD In 1 week.   Specialty:  Internal Medicine   Contact information:   [place] [address] [phone]        The results of significant diagnostics from this hospitalization (including imaging, microbiology, ancillary and laboratory) are listed below for reference.    Significant Diagnostic Studies: X-ray Chest Pa And Lateral   09/06/2013   CLINICAL DATA:  80 year old female with weakness in confusion. Initial encounter.  EXAM: CHEST  2 VIEW  COMPARISON:  09/05/2013 and earlier.  FINDINGS: AP and lateral views of the chest. The patient is rotated to the left. Stable lung volumes in ventilation with no acute or confluent pulmonary opacity identified. No pneumothorax, pleural effusion or edema identified. Stable cardiomegaly and mediastinal contours. Osteopenia.  IMPRESSION: No acute cardiopulmonary abnormality.   Electronically Signed   By: Lars Pinks M.D.   On: 09/06/2013 08:34   Dg Chest 2 View  08/17/2013   CLINICAL DATA:  Fall.  EXAM: CHEST  2 VIEW  COMPARISON:  08/16/2013  FINDINGS: Heart is upper limits normal in size. Minimal right base atelectasis. Biapical scarring. No effusions. No acute bony  abnormality.  IMPRESSION: Minimal right basilar atelectasis.  No acute findings.   Electronically Signed   By: Rolm Baptise M.D.   On: 08/17/2013 09:41   Mr Brain Wo Contrast  09/05/2013   CLINICAL DATA:  New onset atrial fibrillation.  Question CVA.  EXAM: MRI HEAD WITHOUT CONTRAST  TECHNIQUE: Multiplanar, multiecho pulse sequences of the brain and surrounding structures were obtained without intravenous contrast.  COMPARISON:  CT head 08/30/2011.  MRI brain 12/02/2009.  FINDINGS: The diffusion-weighted images demonstrate no evidence for acute or subacute infarction. The study is moderately degraded by patient motion. Extensive periventricular white matter changes are again noted. There is no significant interval change. Moderate generalized atrophy is present. Some white matter changes extend into the brainstem.  Flow present in the vertebrobasilar system. Abnormal signal is present in the right petrous and precavernous carotid artery suggesting high-grade stenosis versus occlusion.  IMPRESSION: 1. No acute intracranial abnormality. 2. Stable atrophy and diffuse white matter disease. This likely reflects the sequelae of chronic microvascular ischemia. 3. Abnormal signal within the right petrous and precavernous carotid artery is new. This may represent a high-grade stenosis or occlusion.   Electronically Signed   By: Lawrence Santiago M.D.   On: 09/05/2013 20:43   Ct Abdomen Pelvis W Contrast  09/05/2013   CLINICAL DATA:  Weakness and nausea for 3 days. Right hip pain. Vomiting. No falls. Abdominal pain.  EXAM: CT ABDOMEN AND PELVIS WITH CONTRAST  TECHNIQUE: Multidetector CT imaging of the abdomen and pelvis was performed using the standard protocol following bolus administration of intravenous contrast.  CONTRAST:  118mL OMNIPAQUE IOHEXOL 300 MG/ML  SOLN  COMPARISON:  None.  FINDINGS: Noncalcified nodule in the anterior right middle lung measuring 4 mm diameter. This is likely benign in a low risk patient.   Surgical absence of the gallbladder. There is moderate intra and extrahepatic bile duct dilatation. This may be due to postoperative physiology. No obstructing mass or stone is identified. Pancreatic duct is not dilated. Diffuse fatty infiltration of the pancreas. No focal liver lesions. Left renal cysts, largest in the upper pole measuring 7.5 cm diameter. No hydronephrosis in the kidneys. The spleen, adrenal glands, inferior vena cava, and retroperitoneal lymph nodes are unremarkable. Diffuse calcification of the abdominal aorta and iliac arteries. Iliac stenosis is likely. Aorta iliac vessels appear patent. The stomach, small bowel, and colon are not abnormally distended. Contrast material flows through to the colon without evidence of obstruction. Scattered stool in the colon. No free air or free fluid in the abdomen.  Pelvis: Visualization of portions of the pelvis is limited due to streak artifact arising from right hip arthroplasty. The appendix is not identified. No evidence of diverticulitis. No pelvic mass or lymphadenopathy is appreciated. With degenerative changes throughout the lumbar spine. No destructive bone lesions appreciated. Fall  IMPRESSION: Bile duct dilatation is likely due to postoperative physiology. Gallbladder is surgically absent. No focal acute process suggested in the abdomen or pelvis. No evidence of bowel obstruction.   Electronically Signed   By: Lucienne Capers M.D.   On: 09/05/2013 23:27   Dg Chest Port 1 View  09/05/2013   CLINICAL DATA:  Weakness, fatigue for 1 day, history hypertension, stroke, COPD, LEFT breast cancer, former smoker  EXAM: PORTABLE CHEST - 1 VIEW  COMPARISON:  Portable exam 1331 hr compared to 08/17/2013  FINDINGS: Rotated to the LEFT.  Normal heart size, mediastinal contours and pulmonary vascularity.  Atherosclerotic calcification aorta.  Lungs emphysematous but clear.  No pleural effusion or pneumothorax.  No acute osseous findings.  IMPRESSION:  Emphysematous changes without acute infiltrate.   Electronically Signed   By: Lavonia Dana M.D.   On: 09/05/2013 13:48   Dg Chest Port 1 View  08/16/2013   CLINICAL DATA:  Shortness of breath and hypotension  EXAM: PORTABLE CHEST - 1 VIEW  COMPARISON:  Portable chest x-ray of October 24, 2011  FINDINGS: The lungs are reasonably well inflated allowing for patient positioning. There is no focal infiltrate. The cardiac silhouette is mildly enlarged but stable. The pulmonary vascularity is not engorged. There is no pleural effusion or pneumothorax. The bony structures are unremarkable.  IMPRESSION: There is no active cardiopulmonary disease.   Electronically Signed   By: David  Martinique   On: 08/16/2013 13:45    Microbiology: No results found for this or any previous visit (from the past 240 hour(s)).   Labs: Basic Metabolic Panel:  Recent Labs Lab 09/05/13 1322 09/05/13 1941 09/06/13 0030 09/07/13 0505  NA 136*  --  134* 134*  K 3.5*  --  5.1 4.4  CL 94*  --  98 97  CO2 24  --  23 24  GLUCOSE 139*  --  103* 99  BUN 24*  --  23 21  CREATININE 0.72 0.83 0.68 0.66  CALCIUM 10.0  --  8.7 9.2  MG  --  1.8  --   --    Liver Function Tests:  Recent Labs Lab 09/06/13 0030 09/07/13 0505  AST 21 25  ALT 12 12  ALKPHOS 78 79  BILITOT 0.4 0.3  PROT 5.5* 5.9*  ALBUMIN 2.5* 2.6*  Recent Labs Lab 09/05/13 1941  LIPASE 29   No results found for this basename: AMMONIA,  in the last 168 hours CBC:  Recent Labs Lab 09/05/13 1322 09/05/13 1941 09/06/13 0030 09/07/13 0505  WBC 11.8* 10.9* 8.8 8.3  NEUTROABS  --   --  6.4 6.1  HGB 13.9 12.8 11.6* 11.4*  HCT 40.9 38.0 34.7* 34.5*  MCV 86.8 88.0 89.2 89.6  PLT 346 321 285 240   Cardiac Enzymes:  Recent Labs Lab 09/05/13 1941 09/06/13 0020 09/06/13 0547  TROPONINI <0.30 <0.30 <0.30   BNP: BNP (last 3 results)  Recent Labs  09/05/13 1416  PROBNP 1509.0*   CBG: No results found for this basename: GLUCAP,  in the last  168 hours     Signed:  Rowe Clack N  [facility] 09/08/2013, 9:07 AM

## 2013-09-08 NOTE — Progress Notes (Signed)
Report called to [facility], spoke to [place], [place]

## 2013-09-11 LAB — URINE CULTURE

## 2013-09-11 NOTE — Progress Notes (Signed)
[facility] PROGRESS NOTE  Dorothy Collins PRF:[id number] DOB: 11/18/1930 DOA: 09/05/2013 PCP: Ezequiel Kayser, MD  Assessment/Plan: Urine cultures came + for E coli, proteus; no UA available -called SNF, d/w her nurse Caryl Pina; per nurse Pt is not complaining of dysuria;  -Recommended to obtain UA, start empric keflex for few days   Kinnie Feil  [facility] Pager [phone]. If 7PM-7AM, please contact night-coverage at [email or web address], password [facility] 09/11/2013, 1:00 PM  LOS: 3 days

## 2013-09-17 ENCOUNTER — Other Ambulatory Visit: Payer: Self-pay | Admitting: *Deleted

## 2013-09-17 MED ORDER — OXYCODONE HCL 5 MG PO TABS: 5.0000 mg | ORAL_TABLET | ORAL | Status: DC | PRN

## 2013-10-05 ENCOUNTER — Non-Acute Institutional Stay (SKILLED_NURSING_FACILITY): Payer: Medicare Other | Admitting: Internal Medicine

## 2013-10-05 DIAGNOSIS — F03918 Unspecified dementia, unspecified severity, with other behavioral disturbance: Secondary | ICD-10-CM

## 2013-10-05 DIAGNOSIS — F0391 Unspecified dementia with behavioral disturbance: Secondary | ICD-10-CM

## 2013-10-05 DIAGNOSIS — F29 Unspecified psychosis not due to a substance or known physiological condition: Secondary | ICD-10-CM

## 2013-10-09 NOTE — Progress Notes (Signed)
Patient ID: Dorothy Collins, female   DOB: 1930/06/13, 80 y.o.   MRN: [id number]              PROGRESS NOTE  DATE:   10/05/2013    FACILITY:  [facility]    LEVEL OF CARE:   SNF   Routine Visit   CHIEF COMPLAINT:  Routine visit to follow medical issues.    HISTORY OF PRESENT ILLNESS:  Dorothy Collins is a lady whom I readmitted to the facility last month after a stay at [facility] from 08/16/2013 through 08/20/2013.  She had an aspiration event.  Chest x-ray showed right lower lobe pneumonia.  She was treated aggressively and she was returned to the facility.   She does not have any advanced directives on the chart.    I received several issues stating that she is somewhat more aggressive.  I have also received a note from Pharmacy saying she is on excessive amounts of vitamin D replacement.    LABORATORY DATA:  Lab work was done on 10/03/2013.  I believe this was routine.    Her LDL cholesterol was 145.    Her CBC with diff was essentially normal.    Comprehensive metabolic panel was normal other than a BUN of 29.    PAST MEDICAL HISTORY/PROBLEM LIST:  Includes:    Dementia, listed as multi-infarct.    COPD.    Gastroesophageal reflux disease.    Hyperlipidemia.    Depression, dating back to 2001.    Left breast cancer.    Hypertension.    Peripheral neuropathy.    Low back pain.    Peripheral vascular disease.    PAST SURGICAL HISTORY:   Includes:    Carotid endarterectomy in 2011.    Closed femoral neck fracture.    Right total knee replacement.    Right breast surgery.    CURRENT MEDICATIONS:   Medication list is reviewed.     Lumigan ophthalmic.    Os-Cal 500/200, 1 tablet twice a day.    Vitamin D3, 2000 U daily.    Clonidine 0.1 twice a day.    Restasis 1 drop twice a day.    Aggrenox 1 capsule b.i.d.    Cymbalta 90 mg daily.    Allegra 180 mg daily.    Neurontin 400 three times a day.    Lidoderm patch to the lower back.    Hyzaar  100/25, 1 tablet daily.    Namenda 5 mg b.i.d.    Lovaza 2 g b.i.d.    Prilosec 40 q.d.    Risperdal M-tabs 1 mg b.i.d.    Simbrinza 0.2%, 1 drop b.i.d.    Zomig 2.5 p.r.n. migraines   Ambien 5 mg q.h.s.    REVIEW OF SYSTEMS:    HEENT:  No headache.   CHEST/RESPIRATORY:  No cough.  No sputum.   CARDIAC:   No chest pain.    GI:  The patient thinks she has lost weight.   GU:  No dysuria.   MUSCULOSKELETAL:  Extremities:  No pain.    PHYSICAL EXAMINATION:   GENERAL APPEARANCE:  The patient is not in any distress.  She is quite verbose.  She is clearly psychotic, in one breath telling me that her mother is deceased but then talking about her mother as if she was actually present in the building.   CHEST/RESPIRATORY:  Shallow, but otherwise clear air entry.   CARDIOVASCULAR:  CARDIAC:   Heart sounds are normal.  There  are no murmurs.   GASTROINTESTINAL:  LIVER/SPLEEN/KIDNEYS:  No liver, no spleen.  No tenderness.   CIRCULATION:   EDEMA/VARICOSITIES:  Extremities:  She has venous stasis and probably significant PAD.   SKIN:  INSPECTION:  There are no open wounds.   PSYCHIATRIC:   MENTAL STATUS:   Again, the patient is orientated.  There is clearly a psychotic element to her thought content.  She is quite disinhibited.  In the past, I had thought she was bipolar, although a lot of our records are no longer on the chart.  The last psychiatry note I see on her chart is from 06/26/2013.  They list her as having dementia and a mood disorder with psychosis.    ASSESSMENT/PLAN:  Dementia.  Listed as vascular.    I had at one point thought the records suggested this lady was bipolar, although I do not see this on her current chart.  There is clearly psychosis present, although I do not have a sense of how badly this is affecting the patient in terms of her interactions with staff or other residents.    ?Weight loss.  I am going to put her on weekly weights and have the staff bring these  forward for my review.    Vitamin D deficiency.  She is on vitamin D and vitamin D3.  I will correct this.  At some point, a 25-hydroxy vitamin D level.    History of breast cancer.  I will need to review where we are with this and other preventative issues.    Hyperlipidemia.  She is on Lovaza, but not on a statin.  I am going to start her today.    CPT CODE: 76195

## 2013-10-15 ENCOUNTER — Non-Acute Institutional Stay (SKILLED_NURSING_FACILITY): Payer: Medicare Other | Admitting: Internal Medicine

## 2013-10-15 DIAGNOSIS — F411 Generalized anxiety disorder: Secondary | ICD-10-CM

## 2013-10-15 DIAGNOSIS — IMO0002 Reserved for concepts with insufficient information to code with codable children: Secondary | ICD-10-CM

## 2013-10-15 DIAGNOSIS — R451 Restlessness and agitation: Secondary | ICD-10-CM

## 2013-10-15 DIAGNOSIS — F05 Delirium due to known physiological condition: Secondary | ICD-10-CM

## 2013-10-17 NOTE — Progress Notes (Signed)
Patient ID: Dorothy Collins, female   DOB: Aug 07, 1930, 80 y.o.   MRN: [id number]           PROGRESS NOTE  DATE: 10/15/2013      FACILITY:  [facility]  LEVEL OF CARE: SNF (31)  Acute Visit  CHIEF COMPLAINT:  Manage anxiety, agitation, and confusion.    HISTORY OF PRESENT ILLNESS: I was requested by the staff to assess the patient regarding above problem(s):  Staff report that patient is having increased anxiety, agitation, and attempts to leave the facility.  She is increasingly confused.  They cannot identify precipitating or alleviating factors.  There is no temporal relationship.  Patient is a poor historian.    PAST MEDICAL HISTORY : Reviewed.  No changes/see problem list  CURRENT MEDICATIONS: Reviewed per MAR/see medication list  REVIEW OF SYSTEMS:  Unobtainable.  The patient is not following commands.    PHYSICAL EXAMINATION  VS: see VS section  GENERAL: no acute distress, normal body habitus NECK: supple, trachea midline, no neck masses, no thyroid tenderness, no thyromegaly RESPIRATORY: breathing is even & unlabored, BS CTAB CARDIAC: RRR, no murmur,no extra heart sounds, no edema GI: abdomen soft, normal BS, no masses, no tenderness, no hepatomegaly, no splenomegaly PSYCHIATRIC: the patient is alert, disoriented, affect & mood appropriate       LABS/RADIOLOGY:  On 10/03/2013:  CBC normal.       BMP normal.      ASSESSMENT/PLAN:  Confusion.  New problem.  Obtain UA, culture and sensitivities.  Medications reviewed.    Anxiety.  Use p.r.n. Klonopin.    Agitation.  Patient is already on antipsychotics.    CPT CODE: 32355        Mary-Ann Pennella Y Burch [place], [place] [phone]

## 2013-11-09 ENCOUNTER — Non-Acute Institutional Stay (SKILLED_NURSING_FACILITY): Payer: Medicare Other | Admitting: Internal Medicine

## 2013-11-09 DIAGNOSIS — F0391 Unspecified dementia with behavioral disturbance: Secondary | ICD-10-CM

## 2013-11-09 DIAGNOSIS — F03918 Unspecified dementia, unspecified severity, with other behavioral disturbance: Secondary | ICD-10-CM

## 2013-11-09 DIAGNOSIS — C50912 Malignant neoplasm of unspecified site of left female breast: Secondary | ICD-10-CM

## 2013-11-09 DIAGNOSIS — I6990 Unspecified sequelae of unspecified cerebrovascular disease: Secondary | ICD-10-CM

## 2013-11-09 DIAGNOSIS — C50919 Malignant neoplasm of unspecified site of unspecified female breast: Secondary | ICD-10-CM

## 2013-11-15 NOTE — Progress Notes (Addendum)
Patient ID: Dorothy Collins, female   DOB: 1931-03-10, 80 y.o.   MRN: [id number]               PROGRESS NOTE  DATE:  11/09/2013     FACILITY: [facility]    LEVEL OF CARE:   SNF   Routine Visit   HISTORY OF PRESENT ILLNESS:  Dorothy Collins is a patient who is a longstanding resident of this facility.  She was last admitted to hospital from 08/16/2013 through 08/20/2013 with aspiration pneumonia.  She was aggressively treated and returned to the facility.    She was treated earlier this month for an acute confusional state.  A urine culture was done that showed no growth.    LABORATORY DATA:  Lab work from 10/03/2013 showed an essentially normal CBC with differential.   Her comprehensive metabolic panel showed a BUN of 29 and a creatinine of 0.87.     Liver function tests were normal.    Her LDL cholesterol was 145.    PAST MEDICAL HISTORY/PROBLEM LIST:   Includes:    Vascular dementia with behavioral disturbances.    Carotid artery disease.    Late-effect CVA.    Hypertension, renovascular, with a history of hypokalemia.    Glaucoma.    Gastroesophageal reflux disease.    Atrial fibrillation.    CURRENT MEDICATIONS:  Medication list is reviewed.     Xalatan ophthalmic.    Vitamin D3, 2000 U daily.    Cymbalta 90 mg daily.    Allegra 180 q.d.    Lidoderm patch 5% to the back daily.    Senokot-S 1 tablet q.9 a.m.     Os-Cal plus D 500, 1 tablet twice daily.    Vitamin B 400 IU twice daily.    Restasis 1 drop in each eye b.i.d.    Aggrenox 25/200 b.i.d.    Namenda 5 mg twice daily.    Lopressor 25 mg, 1/2 tablet twice daily.    Prilosec 40 b.i.d.    Risperdal M-tabs 0.5 q.a.m. and q.h.s.        Gabapentin 400 mg three times a day.    REVIEW OF SYSTEMS:     GENERAL:  The patient states she does "not feel well and not eating well".   CHEST/RESPIRATORY:  No shortness of breath.  CARDIAC:   No chest pain.    GI:  States she has vague abdominal pain  that she really cannot describe.   GU:  No dysuria.    PHYSICAL EXAMINATION:   VITAL SIGNS:   PULSE:  78.   RESPIRATIONS:  16.   O2 SATURATIONS:  93% on room air.   GENERAL APPEARANCE:  The patient is awake, conversational.  Somewhat depressed-looking.   CHEST/RESPIRATORY:  Shallow air entry bilaterally.   CARDIOVASCULAR:  CARDIAC:   Heart sounds are normal.  There are no murmurs.  No gallops.  She appears to be euvolemic.   BREASTS:  Previous left breast surgery noted.  The right breast is without mass.   LYMPHATICS:  There is no lymphadenopathy in the supraclavicular, infraclavicular, or axillary areas.   GASTROINTESTINAL:  ABDOMEN:   She looks as though she has lost weight.  However, there are no masses.   LIVER/SPLEEN/KIDNEYS:  No liver, no spleen palpable.  There is no real tenderness.   GENITOURINARY:  BLADDER:   Not enlarged.  There is no CVA tenderness.    ASSESSMENT/PLAN:  History of left breast cancer.  It appears that she  has not had mammographies since 2011.  I think it would be reasonable to order this one more time if she will agree to it.    Probable vascular dementia, with an MRI in Dorothy Collins showing diffuse white matter disease.  I had thought that this lady carried a psychiatric diagnosis.  However, I do not have access to these records.  She is followed by Psychiatry in-house and they have been adjusting her psychiatric medications.    Weight loss.  Although the patient certainly looks like she has lost weight, this does not appear to be verified by looking at the facility records.  She appears to have been in the 150-pound range, last weight at the end of July.    Hyperlipidemia.  When last checked on 10/03/2013, her LDL was 145.  She is on Lipitor 10 mg.    In terms of overall health maintenance, the patient probably should be on aspirin.  She has a history of cerebrovascular disease, carotid artery disease.  I will follow up with a mammogram of her right breast.  I will  order stool guaiacs, although her hemoglobin has been stable.   I do not believe she is a good candidate for a colonoscopy.

## 2013-12-04 ENCOUNTER — Non-Acute Institutional Stay (SKILLED_NURSING_FACILITY): Payer: Medicare Other | Admitting: Internal Medicine

## 2013-12-04 DIAGNOSIS — R112 Nausea with vomiting, unspecified: Secondary | ICD-10-CM

## 2013-12-07 NOTE — Progress Notes (Addendum)
Patient ID: Dorothy Collins, female   DOB: 12/14/1930, 80 y.o.   MRN: [id number]               PROGRESS NOTE  DATE:  12/04/2013    FACILITY: [facility]    LEVEL OF CARE:   SNF   Acute Visit   CHIEF COMPLAINT:  Episodic abdominal pain, nausea without vomiting.    HISTORY OF PRESENT ILLNESS:  Apparently, this patient episodically complains of abdominal pain.  This morning, she complained of feeling sick with nausea but could not throw up.  She states she had two normal bowel movements yesterday.   She is refusing medications.  Currently, she tells me she feels better.  She is not spontaneously complaining of abdominal pain.  She has a history of a prior cholecystectomy and appendectomy, according to the patient.    REVIEW OF SYSTEMS:   CHEST/RESPIRATORY:  No shortness of breath.  CARDIAC:   No chest pain.     GI:  See HPI.   GU:  She is not complaining of dysuria.    PHYSICAL EXAMINATION:   VITAL SIGNS:   BLOOD PRESSURE:  126/86.   PULSE:  94.   TEMPERATURE:  98.1.   RESPIRATIONS:  20.   GENERAL APPEARANCE:  The patient does not look unwell.   CHEST/RESPIRATORY:  Clear air entry bilaterally.   CARDIOVASCULAR:  CARDIAC:   Heart sounds are normal.  There are no murmurs.  If anything, she looks slightly dehydrated.   GASTROINTESTINAL:  ABDOMEN:   Nondistended.  Bowel sounds are positive.  A vertical right upper quadrant scar.  She is somewhat tender in the right upper quadrant, but there is no guarding or rebound.   No masses.   LIVER/SPLEEN/KIDNEYS:  No liver, no spleen palpable.   GENITOURINARY:  BLADDER:   No suprapubic or costovertebral angle tenderness.     ASSESSMENT/PLAN:  Complaints of abdominal pain and nausea without vomiting.  Apparently, this is a cyclical type of thing.  She complains of this every two or three days or so.  The findings at the bedside are nonspecific.  She initially seemed to have some right upper quadrant tenderness.  The next time I checked here, it  did not seem so impressive.  She is not distended.  Bowel sounds are positive.  I am going to go ahead and check her lab work including a comprehensive metabolic panel, CBC with diff, and a serum lipase.  I will also check a urine for C&S on her only.  I am going to try to recheck her when I am next in the building.  In spite of these complaints, she does not appear to have lost substantial amounts of weight although there has not been a recent weight on her.  We will need to recheck this, as well.

## 2014-01-01 ENCOUNTER — Non-Acute Institutional Stay (SKILLED_NURSING_FACILITY): Payer: Medicare Other | Admitting: Internal Medicine

## 2014-01-01 DIAGNOSIS — I6990 Unspecified sequelae of unspecified cerebrovascular disease: Secondary | ICD-10-CM

## 2014-01-01 DIAGNOSIS — F0391 Unspecified dementia with behavioral disturbance: Secondary | ICD-10-CM

## 2014-01-01 DIAGNOSIS — E785 Hyperlipidemia, unspecified: Secondary | ICD-10-CM

## 2014-01-01 DIAGNOSIS — F03918 Unspecified dementia, unspecified severity, with other behavioral disturbance: Secondary | ICD-10-CM

## 2014-01-08 NOTE — Progress Notes (Addendum)
Patient ID: Dorothy Collins, female   DOB: Aug 10, 1930, 80 y.o.   MRN: [id number]              PROGRESS NOTE  DATE:  01/01/2014      FACILITY: [facility]    LEVEL OF CARE:   SNF   Routine Visit   CHIEF COMPLAINT:  Routine visit to follow medical issues.    HISTORY OF PRESENT ILLNESS:  Dorothy Collins is a lady who has been in the building for many years.  Her major disability is multi-infarct state.    My memory says at one point this lady had an underlying psychiatric history.  However, I am no longer able to find this in the records.  She is followed by Psychiatry.    She was last in hospital in [place] of this year.  She had pneumonia on the background of her COPD.  She was treated and is stable.    She generally has not done quite as well.  No longer do I see her up in her wheelchair quite as often.     PAST MEDICAL HISTORY/PROBLEM LIST:  Includes:    Multi-infarct dementia.    COPD.    Gastroesophageal reflux disease.     Glaucoma.    Hyperlipidemia.    History of breast cancer in 2010.    Hypertension.    Osteoporosis.    Peripheral neuropathy.    Peripheral vascular disease.    PAST SURGICAL HISTORY:   Includes:    Left carotid endarterectomy.     History of fractured femur.    Cataract surgery.    Joint replacement in the right knee.    CURRENT MEDICATIONS:   Medication list is reviewed.     Xalatan 1 drop in each eye daily.    Vitamin D3, 2000 U daily.    Cymbalta 90 daily.    Allegra 180 daily.    Lidoderm patch topically for the back, on and off 12 hours.    Senokot 8.6, 1 tablet daily.    Lipitor 10 q.d.    Ecotrin 81 q.d.    Os-Cal 500 plus D twice daily.    Aggrenox 25/200, 1 tablet twice daily.    Namenda 5 mg twice daily.     Lopressor 1/2 tablet, 12.5 b.i.d.    Lovaza 2 capsules twice daily.    Risperdal 0.5 b.i.d.    Prilosec 20 q.d.    Neurontin 400 three times a day.    Simbrinza 1 drop in each eye b.i.d.    REVIEW  OF SYSTEMS:   GENERAL:  The patient states she feels well.  States she is eating well.  Has had no further vomiting.   CHEST/RESPIRATORY:  No cough.  No shortness of breath.   CARDIAC:   No chest pain.   GI:  No abdominal pain.   No change in bowel habits.    PHYSICAL EXAMINATION:   GENERAL APPEARANCE:  The patient is not in any distress, although she looks frailer over the last six months.   CHEST/RESPIRATORY:  Shallow, but otherwise clear air entry.  Work of breathing is normal.   CARDIOVASCULAR:  CARDIAC:   Heart sounds are normal.  There are no murmurs.   GASTROINTESTINAL:  ABDOMEN:   Soft.   LIVER/SPLEEN/KIDNEYS:  No liver, no spleen.  No tenderness.   CIRCULATION:   EDEMA/VARICOSITIES:  Extremities:  No edema.    ASSESSMENT/PLAN:  Multi-infarct dementia.  She is on both aspirin and Aggrenox.  Lab work at the beginning of this month showed a normal hemoglobin.    Hyperlipidemia.  Last checked in September.  Her LDL cholesterol was 125.  She is on Lipitor 10.  I will increase this.    Medication refusals.  Apparently, she is doing better here.    Depression with anxiety and underlying dementia.   I really have not been able to find this woman's original psychiatric history.  In my mind, I thought at one point she had bipolar although I have not been able to verify this.    She needs f/u mammography, not sure if this has been arranged  Overall, her status has been stable although I do not think she is quite as active as she once was.  There are no active medical or psychiatric concerns that I can see.    The Lipitor will be increased.    CPT CODE: 62263

## 2014-03-22 ENCOUNTER — Emergency Department (HOSPITAL_COMMUNITY)
Admission: EM | Admit: 2014-03-22 | Discharge: 2014-03-23 | Disposition: A | Discharge status: Home / Self Care | Payer: Medicare Other | Attending: Emergency Medicine | Admitting: Emergency Medicine

## 2014-03-22 ENCOUNTER — Encounter (HOSPITAL_COMMUNITY): Payer: Self-pay

## 2014-03-22 ENCOUNTER — Emergency Department (HOSPITAL_COMMUNITY): Payer: Medicare Other

## 2014-03-22 DIAGNOSIS — Z853 Personal history of malignant neoplasm of breast: Secondary | ICD-10-CM | POA: Insufficient documentation

## 2014-03-22 DIAGNOSIS — G4700 Insomnia, unspecified: Secondary | ICD-10-CM | POA: Insufficient documentation

## 2014-03-22 DIAGNOSIS — Z7982 Long term (current) use of aspirin: Secondary | ICD-10-CM | POA: Diagnosis not present

## 2014-03-22 DIAGNOSIS — I10 Essential (primary) hypertension: Secondary | ICD-10-CM | POA: Insufficient documentation

## 2014-03-22 DIAGNOSIS — G629 Polyneuropathy, unspecified: Secondary | ICD-10-CM | POA: Diagnosis not present

## 2014-03-22 DIAGNOSIS — K219 Gastro-esophageal reflux disease without esophagitis: Secondary | ICD-10-CM | POA: Insufficient documentation

## 2014-03-22 DIAGNOSIS — E785 Hyperlipidemia, unspecified: Secondary | ICD-10-CM | POA: Diagnosis not present

## 2014-03-22 DIAGNOSIS — F419 Anxiety disorder, unspecified: Secondary | ICD-10-CM | POA: Diagnosis not present

## 2014-03-22 DIAGNOSIS — F329 Major depressive disorder, single episode, unspecified: Secondary | ICD-10-CM | POA: Insufficient documentation

## 2014-03-22 DIAGNOSIS — Z8673 Personal history of transient ischemic attack (TIA), and cerebral infarction without residual deficits: Secondary | ICD-10-CM | POA: Diagnosis not present

## 2014-03-22 DIAGNOSIS — J449 Chronic obstructive pulmonary disease, unspecified: Secondary | ICD-10-CM | POA: Diagnosis not present

## 2014-03-22 DIAGNOSIS — Z880 Allergy status to penicillin: Secondary | ICD-10-CM | POA: Insufficient documentation

## 2014-03-22 DIAGNOSIS — Z862 Personal history of diseases of the blood and blood-forming organs and certain disorders involving the immune mechanism: Secondary | ICD-10-CM | POA: Insufficient documentation

## 2014-03-22 DIAGNOSIS — R1011 Right upper quadrant pain: Secondary | ICD-10-CM | POA: Diagnosis present

## 2014-03-22 DIAGNOSIS — F0390 Unspecified dementia without behavioral disturbance: Secondary | ICD-10-CM | POA: Diagnosis not present

## 2014-03-22 DIAGNOSIS — Z79899 Other long term (current) drug therapy: Secondary | ICD-10-CM | POA: Diagnosis not present

## 2014-03-22 DIAGNOSIS — M1990 Unspecified osteoarthritis, unspecified site: Secondary | ICD-10-CM | POA: Diagnosis not present

## 2014-03-22 DIAGNOSIS — Z87891 Personal history of nicotine dependence: Secondary | ICD-10-CM | POA: Diagnosis not present

## 2014-03-22 LAB — COMPREHENSIVE METABOLIC PANEL
ALBUMIN: 3.6 g/dL (ref 3.5–5.2)
ALT: 13 U/L (ref 0–35)
AST: 31 U/L (ref 0–37)
Alkaline Phosphatase: 79 U/L (ref 39–117)
Anion gap: 14 (ref 5–15)
BUN: 18 mg/dL (ref 6–23)
CHLORIDE: 101 meq/L (ref 96–112)
CO2: 20 mmol/L (ref 19–32)
CREATININE: 0.67 mg/dL (ref 0.50–1.10)
Calcium: 9.2 mg/dL (ref 8.4–10.5)
GFR calc Af Amer: >90 mL/min (ref >90)
GFR calc non Af Amer: 80 mL/min — ABNORMAL LOW (ref >90)
Glucose, Bld: 100 mg/dL — ABNORMAL HIGH (ref 70–99)
POTASSIUM: 4.1 mmol/L (ref 3.5–5.1)
Sodium: 135 mmol/L (ref 135–145)
Total Bilirubin: 1.4 mg/dL — ABNORMAL HIGH (ref 0.3–1.2)
Total Protein: 7.1 g/dL (ref 6.0–8.3)

## 2014-03-22 LAB — CBC WITH DIFFERENTIAL/PLATELET
BASOS ABS: 0.1 10*3/uL (ref 0.0–0.1)
BASOS PCT: 1 % (ref 0–1)
Eosinophils Absolute: 0.2 10*3/uL (ref 0.0–0.7)
Eosinophils Relative: 2 % (ref 0–5)
HCT: 47.4 % — ABNORMAL HIGH (ref 36.0–46.0)
Hemoglobin: 15.5 g/dL — ABNORMAL HIGH (ref 12.0–15.0)
Lymphocytes Relative: 24 % (ref 12–46)
Lymphs Abs: 2.6 10*3/uL (ref 0.7–4.0)
MCH: 28.8 pg (ref 26.0–34.0)
MCHC: 32.7 g/dL (ref 30.0–36.0)
MCV: 88.1 fL (ref 78.0–100.0)
MONO ABS: 0.8 10*3/uL (ref 0.1–1.0)
Monocytes Relative: 8 % (ref 3–12)
Neutro Abs: 7.2 10*3/uL (ref 1.7–7.7)
Neutrophils Relative %: 65 % (ref 43–77)
Platelets: 310 10*3/uL (ref 150–400)
RBC: 5.38 MIL/uL — AB (ref 3.87–5.11)
RDW: 14.3 % (ref 11.5–15.5)
WBC: 10.9 10*3/uL — ABNORMAL HIGH (ref 4.0–10.5)

## 2014-03-22 LAB — LIPASE, BLOOD: Lipase: 34 U/L (ref 11–59)

## 2014-03-22 MED ORDER — IOHEXOL 300 MG/ML  SOLN
100.0000 mL | Freq: Once | INTRAMUSCULAR | Status: AC | PRN
  Administered 2014-03-22: 100 mL via INTRAVENOUS

## 2014-03-22 MED ORDER — SODIUM CHLORIDE 0.9 % IV BOLUS (SEPSIS)
1000.0000 mL | Freq: Once | INTRAVENOUS | Status: AC
  Administered 2014-03-23: 1000 mL via INTRAVENOUS

## 2014-03-22 MED ORDER — PANTOPRAZOLE SODIUM 40 MG IV SOLR
40.0000 mg | Freq: Once | INTRAVENOUS | Status: AC
  Administered 2014-03-23: 40 mg via INTRAVENOUS
  Filled 2014-03-22: qty 40

## 2014-03-22 MED ORDER — SODIUM CHLORIDE 0.9 % IV BOLUS (SEPSIS)
500.0000 mL | Freq: Once | INTRAVENOUS | Status: AC
Start: 2014-03-22 — End: 2014-03-23
  Administered 2014-03-22: 500 mL via INTRAVENOUS

## 2014-03-22 MED ORDER — ONDANSETRON HCL 4 MG/2ML IJ SOLN
4.0000 mg | Freq: Once | INTRAMUSCULAR | Status: AC
  Administered 2014-03-22: 4 mg via INTRAVENOUS
  Filled 2014-03-22: qty 2

## 2014-03-22 MED ORDER — FENTANYL CITRATE 0.05 MG/ML IJ SOLN
25.0000 ug | Freq: Once | INTRAMUSCULAR | Status: AC
  Administered 2014-03-22: 25 ug via INTRAVENOUS
  Filled 2014-03-22: qty 2

## 2014-03-22 MED ORDER — ONDANSETRON HCL 4 MG/2ML IJ SOLN
4.0000 mg | Freq: Once | INTRAMUSCULAR | Status: AC
  Administered 2014-03-23: 4 mg via INTRAVENOUS
  Filled 2014-03-22: qty 2

## 2014-03-22 NOTE — Progress Notes (Signed)
CSW met with patient at bedside who confirms she is from [facility]. There was no family at bedside. Patient also confirms that she presents to the ED because of nausea and stomach aches. Patient stated " I don't know anything right now. Im so very sick." Per chart, patient has a history of dementia.   Willette Brace [phone] ED CSW 03/22/2014 9:33 PM

## 2014-03-22 NOTE — ED Provider Notes (Addendum)
CSN: [id number]     Arrival date & time 03/22/14  1852 History   First MD Initiated Contact with Patient 03/22/14 1907     Chief Complaint  Patient presents with  . Nausea  . Abdominal Pain     (Consider location/radiation/quality/duration/timing/severity/associated sxs/prior Treatment) HPI..... Level V caveat for dementia. Right upper quadrant pain for unknown period time. She has not been eating lately.  No fevers, chills, vomiting, diarrhea, dysuria. Past medical history cholecystectomy. No radiation of pain. Severity is mild.  Past Medical History  Diagnosis Date  . Dementia   . CVA (cerebral vascular accident)   . COPD (chronic obstructive pulmonary disease)     Emphysema  . GERD (gastroesophageal reflux disease)   . Glaucoma   . Hyperlipidemia   . Insomnia   . Rhinitis 12/14/1999  . Depression 10/12/2009  . Cancer 07/18/2008    Breast cancer-Left  . Hypertension   . Arthritis 07/18/2008    osteoporosis  . Peripheral neuropathy 07/18/2008  . Cerebrovascular disease 08/05/2003    acute  . Lumbago 08/05/2003  . Edema 08/05/2003  . Cellulitis and abscess of leg 08/05/2003  . Peripheral vascular disease 08/05/2003  . Anxiety 08/05/2003  . Hypopotassemia 08/05/2003  . Anemia    Past Surgical History  Procedure Laterality Date  . Carotid endarterectomy  2011    Left CEA  . Eye surgery  07/18/2008    cataract  . Fracture surgery  07/18/2008    closed  part neck femur  . Joint replacement      rt knee  . Breast surgery      rt   No family history on file. History  Substance Use Topics  . Smoking status: Former Smoker    Types: Cigarettes    Quit date: 03/16/1999  . Smokeless tobacco: Not on file  . Alcohol Use: No   OB History    No data available     Review of Systems  Unable to perform ROS: Dementia      Allergies  Lyrica; Morphine and related; Penicillins; and Sulfa antibiotics  Home Medications   Prior to Admission medications   Medication Sig Start Date  End Date Taking? Authorizing Provider  aspirin EC 81 MG tablet Take 81 mg by mouth daily.   Yes Historical Provider, MD  atorvastatin (LIPITOR) 20 MG tablet Take 20 mg by mouth daily.   Yes Historical Provider, MD  Brinzolamide-Brimonidine [facility]) 1-0.2 % SUSP Place 1 drop into both eyes 2 (two) times daily.    Yes Historical Provider, MD  calcium-vitamin D (OSCAL WITH D) 500-200 MG-UNIT per tablet Take 1 tablet by mouth 2 (two) times daily.    Yes Historical Provider, MD  Cholecalciferol 2000 UNITS CAPS Take 2,000 Units by mouth daily.   Yes Historical Provider, MD  clonazePAM (KLONOPIN) 0.5 MG tablet Take 1 tablet (0.5 mg total) by mouth 3 (three) times daily as needed for anxiety. 09/07/13  Yes Kinnie Feil, MD  cycloSPORINE (RESTASIS) 0.05 % ophthalmic emulsion Place 1 drop into both eyes 2 (two) times daily.   Yes Historical Provider, MD  dipyridamole-aspirin (AGGRENOX) 25-200 MG per 12 hr capsule Take 1 capsule by mouth 2 (two) times daily.   Yes Historical Provider, MD  DULoxetine (CYMBALTA) 30 MG capsule Take 2 capsules (60 mg total) by mouth daily. Takes with 60 mg for total of 90mg  Patient taking differently: Take 90 mg by mouth daily. Takes with 60 mg for total of 90mg  09/07/13  Yes Ulugbek  Wynonia Lawman, MD  fexofenadine (ALLEGRA) 180 MG tablet Take 180 mg by mouth daily.   Yes Historical Provider, MD  gabapentin (NEURONTIN) 400 MG capsule Take 1 capsule (400 mg total) by mouth 3 (three) times daily. 09/07/13  Yes Kinnie Feil, MD  ketotifen (ZADITOR) 0.025 % ophthalmic solution Place 1 drop into both eyes 2 (two) times daily. Wait 3-5 minutes between eye meds   Yes Historical Provider, MD  lamoTRIgine (LAMICTAL) 25 MG tablet Take 50 mg by mouth at bedtime.   Yes Historical Provider, MD  latanoprost (XALATAN) 0.005 % ophthalmic solution Place 1 drop into both eyes at bedtime.   Yes Historical Provider, MD  lidocaine (LIDODERM) 5 % Place 1 patch onto the skin daily. Apply to back  -   Remove & Discard patch within 12 hours or as directed by MD. On for 12 hours off for 12 hours   Yes Historical Provider, MD  meclizine (ANTIVERT) 25 MG tablet Take 25 mg by mouth every 6 (six) hours as needed for dizziness.    Yes Historical Provider, MD  memantine (NAMENDA) 5 MG tablet Take 5 mg by mouth 2 (two) times daily.   Yes Historical Provider, MD  metoprolol tartrate (LOPRESSOR) 25 MG tablet Take 12.5 mg by mouth 2 (two) times daily.   Yes Historical Provider, MD  omega-3 acid ethyl esters (LOVAZA) 1 G capsule Take 2 g by mouth 2 (two) times daily.   Yes Historical Provider, MD  omeprazole (PRILOSEC) 20 MG capsule Take 40 mg by mouth 2 (two) times daily before a meal.    Yes Historical Provider, MD  oxyCODONE (OXY IR/ROXICODONE) 5 MG immediate release tablet Take 1 tablet (5 mg total) by mouth every 4 (four) hours as needed for severe pain. 09/17/13  Yes Tiffany L Reed, DO  Polyethyl Glycol-Propyl Glycol (SYSTANE OP) Apply 1 drop to eye every 2 (two) hours.    Yes Historical Provider, MD  polyethylene glycol (MIRALAX / GLYCOLAX) packet Take 17 g by mouth daily.   Yes Historical Provider, MD  risperiDONE (RISPERDAL M-TABS) 0.5 MG disintegrating tablet Take 0.5 mg by mouth 2 (two) times daily.   Yes Historical Provider, MD  senna (SENOKOT) 8.6 MG tablet Take 1 tablet by mouth daily.   Yes Historical Provider, MD  ZOLMitriptan (ZOMIG) 2.5 MG tablet Take 2.5 mg by mouth as needed. For migraine   Yes Historical Provider, MD  metoprolol tartrate (LOPRESSOR) 12.5 mg TABS tablet Take 0.5 tablets (12.5 mg total) by mouth 2 (two) times daily. 09/07/13   Kinnie Feil, MD  risperiDONE (RISPERDAL M-TABS) 1 MG disintegrating tablet Take 0.5 tablets (0.5 mg total) by mouth 2 (two) times daily. 09/07/13   Kinnie Feil, MD   BP 148/94 mmHg  Pulse 98  Temp(Src) 97.6 F (36.4 C) (Oral)  Resp 20  SpO2 94% Physical Exam  Constitutional:  Nontoxic-appearing  HENT:  Head: Normocephalic and atraumatic.   Eyes: Conjunctivae and EOM are normal. Pupils are equal, round, and reactive to light.  Neck: Normal range of motion. Neck supple.  Cardiovascular: Normal rate and regular rhythm.   Pulmonary/Chest: Effort normal and breath sounds normal.  Abdominal: Soft. Bowel sounds are normal.  Minimal tenderness right upper quadrant  Musculoskeletal: Normal range of motion.  Neurological: She is alert.  Has dementia  Skin: Skin is warm and dry.  Psychiatric:  Flat affect  Nursing note and vitals reviewed.   ED Course  Procedures (including critical care time) Labs Review Labs  Reviewed  COMPREHENSIVE METABOLIC PANEL - Abnormal; Notable for the following:    Glucose, Bld 100 (*)    Total Bilirubin 1.4 (*)    GFR calc non Af Amer 80 (*)    All other components within normal limits  CBC WITH DIFFERENTIAL - Abnormal; Notable for the following:    WBC 10.9 (*)    RBC 5.38 (*)    Hemoglobin 15.5 (*)    HCT 47.4 (*)    All other components within normal limits  LIPASE, BLOOD  URINALYSIS, ROUTINE W REFLEX MICROSCOPIC    Imaging Review Ct Abdomen Pelvis W Contrast  03/22/2014   CLINICAL DATA:  81 year old female with dementia. Poor oral intake. Abdominal discomfort.  EXAM: CT ABDOMEN AND PELVIS WITH CONTRAST  TECHNIQUE: Multidetector CT imaging of the abdomen and pelvis was performed using the standard protocol following bolus administration of intravenous contrast.  CONTRAST:  16mL OMNIPAQUE IOHEXOL 300 MG/ML  SOLN  COMPARISON:  CT of the abdomen and pelvis 09/05/2013.  FINDINGS: Lower chest: Atherosclerosis in the descending thoracic aorta. Otherwise, unremarkable.  Hepatobiliary: Status post cholecystectomy. Moderate intrahepatic biliary ductal dilatation, similar to the prior examination. Common bile duct measures up to 12 mm in diameter, similar to the prior examination, likely related to post cholecystectomy physiology. No suspicious solid hepatic lesions.  Pancreas: Unremarkable. Specifically,  no pancreatic ductal dilatation.  Spleen: Unremarkable.  Adrenals/Urinary Tract: Normal appearance of the adrenal glands bilaterally and the right kidney. 7.7 cm exophytic low-attenuation lesion extending off the upper pole of the left kidney is compatible with a large simple cyst. 2.4 cm simple cyst in the posterior aspect of the interpolar region of the left kidney also noted. No hydroureteronephrosis. Urinary bladder is partially obscured by extensive beam hardening artifact from the patient's right-sided hip arthroplasty, but is unremarkable in appearance.  Stomach/Bowel: Normal appearance of the stomach. No pathologic dilatation of small bowel or colon. The colon is extremely redundant. The cecum in particular extends into an unusual position in the left upper quadrant. The cecum itself is mildly distended, however, the small bowel immediately proximal to this is completely decompressed, as is the more distal colon.  Vascular/Lymphatic: Extensive atherosclerosis throughout the abdominal and pelvic vasculature, without definite aneurysm or dissection. No lymphadenopathy noted in the abdomen or pelvis.  Reproductive: Status post hysterectomy. Ovaries are not confidently identified may be surgically absent or atrophic.  Other: No significant volume of ascites.  No pneumoperitoneum.  Musculoskeletal: Status post right hip bipolar hemiarthroplasty. There are no aggressive appearing lytic or blastic lesions noted in the visualized portions of the skeleton.  IMPRESSION: 1. No acute findings in the abdomen or pelvis to account for the patient's symptoms. 2. Chronic intra and extrahepatic biliary ductal dilatation, unchanged compared to the prior examination from 09/05/2013, presumably reflective of post cholecystectomy physiology. 3. Extensive atherosclerosis. 4. Additional incidental findings, as above.   Electronically Signed   By: Vinnie Langton M.D.   On: 03/22/2014 23:01     EKG Interpretation   Date/Time:   Friday March 22 2014 19:49:28 EST Ventricular Rate:  120 PR Interval:    QRS Duration: 70 QT Interval:  287 QTC Calculation: 405 R Axis:   2 Text Interpretation:  Atrial fibrillation Ventricular premature complex  Anteroseptal infarct, old Nonspecific T abnormalities, inferior leads  Confirmed by Shaneice Barsanti  MD, Hayle Parisi ([id number]) on 03/22/2014 8:38:22 PM      MDM   Final diagnoses:  RUQ pain    No acute abdomen. Patient does not  appear toxic. CT abdomen pelvis reveal no acute findings. Will hydrate patient, treat pain, IV Zofran, IV Protonix. Discharge medications Zofran 4 mg    Nat Christen, MD 03/23/14 0001  Nat Christen, MD 03/23/14 1128

## 2014-03-22 NOTE — ED Notes (Addendum)
Pt presents via EMS from [facility]. Pt has dementia, per staff pt has not been compliant with eating her food for one day only and did not take her medications today. Pt's last meal was last night and pt had a bowel movement today. EMS reported that pt will refuse to answer questions if she does not want to answer them. Pt reports that she is nauseated and that her stomach hurts.

## 2014-03-22 NOTE — ED Notes (Signed)
Bed: WA08 Expected date:  Expected time:  Means of arrival:  Comments: EMS-not eating

## 2014-03-23 DIAGNOSIS — R1011 Right upper quadrant pain: Secondary | ICD-10-CM | POA: Diagnosis not present

## 2014-03-23 MED ORDER — ONDANSETRON HCL 4 MG PO TABS: 4.0000 mg | ORAL_TABLET | Freq: Four times a day (QID) | ORAL | Status: DC

## 2014-03-23 NOTE — ED Notes (Signed)
[facility] called for transport.  

## 2014-03-23 NOTE — Discharge Instructions (Signed)
CT scan shows no acute findings. Prescription for Zofran. Try to encourage clear liquids. Return if worse.

## 2014-03-23 NOTE — ED Notes (Addendum)
Pt was adamant that she was in no condition to leave at discharge. Explained to patient that her tests were negative and that we did not have a reason to admit her to the hospital. Cleaned patient and put a new brief on patient. After cleaning pt, she began to complain that she was burning in her private areas. Explained to pt that we had just cleaned her up which was the probable cause of the irritation.

## 2015-01-27 ENCOUNTER — Encounter: Payer: Self-pay | Admitting: Cardiovascular Disease

## 2015-01-27 ENCOUNTER — Ambulatory Visit (INDEPENDENT_AMBULATORY_CARE_PROVIDER_SITE_OTHER): Payer: Medicare Other | Admitting: Cardiovascular Disease

## 2015-01-27 VITALS — BP 120/70 | HR 73 | Ht 62.0 in | Wt 147.0 lb

## 2015-01-27 DIAGNOSIS — I639 Cerebral infarction, unspecified: Secondary | ICD-10-CM | POA: Diagnosis not present

## 2015-01-27 DIAGNOSIS — E785 Hyperlipidemia, unspecified: Secondary | ICD-10-CM | POA: Diagnosis not present

## 2015-01-27 DIAGNOSIS — I480 Paroxysmal atrial fibrillation: Secondary | ICD-10-CM | POA: Diagnosis not present

## 2015-01-27 DIAGNOSIS — I4891 Unspecified atrial fibrillation: Secondary | ICD-10-CM

## 2015-01-27 DIAGNOSIS — I10 Essential (primary) hypertension: Secondary | ICD-10-CM

## 2015-01-27 DIAGNOSIS — R0602 Shortness of breath: Secondary | ICD-10-CM

## 2015-01-27 MED ORDER — RIVAROXABAN 20 MG PO TABS: 20.0000 mg | ORAL_TABLET | Freq: Every day | ORAL | Status: AC

## 2015-01-27 NOTE — Patient Instructions (Addendum)
Your physician wants you to follow-up in [place].  You will receive a reminder letter in the mail two months in advance. If you don't receive a letter, please call our office to schedule the follow-up appointment.    Your physician has requested that you have an echocardiogram at [facility]. Echocardiography is a painless test that uses sound waves to create images of your heart. It provides your doctor with information about the size and shape of your heart and how well your heart's chambers and valves are working. This procedure takes approximately one hour. There are no restrictions for this procedure.  LAB- bmp,cbc,magnesium,TSH,FREET4, PLEASE HAVE DRAWN this Tuesday or Wednesday  01/28/15 or 01/29/15.  SEND RESULTS TO DR RANDOPLH'S OFFICE   FAX [phone]   PHONE [phone]   Start Xarelto  20 MG  ONE TABLET BY MOUTH --GIVE WITH HEAVIEST MEAL OF THE DAY  STOP AGGRENOX  CONTINUE WITH ASPIRIN 81 MG ONE TABLET BY MOUTH DAILY.  CONTINUE METOPROLOL  TARTRATE 12.5 MG  TWICE A DAY BY MOUTH.

## 2015-01-27 NOTE — Progress Notes (Signed)
Cardiology Office Note   Date:  01/27/2015   ID:  Dorothy Collins, DOB 1930/12/04, MRN [id number]  PCP:  No primary care provider on file.  Cardiologist:   Sharol Harness, MD   Chief Complaint  Patient presents with  . Atrial Fibrillation      History of Present Illness: Dorothy Collins is a 81 y.o. female with atrial fibrillation, stroke, carotid stenosis, and hyperlipidemia who presents for management of atrial fibrillation. Dorothy Collins is unsure why she is here today.  Her only complaint is mild shortness of breath. This typically occurs when sitting and not with exertion. She does not walk much and is mostly in a wheelchair. She denies any chest pain or pressure. She also denies palpitations, lightheadedness or dizziness. She occasionally notes lower extremity edema that improves with elevation. She denies any orthopnea or PND. Dorothy Collins is a resident of the [facility]. She thinks she is been there for approximately 11 years. She denies any falls. She does not get much exercise.  It appears that [name] was referred for atrial fibrillation. She denies being told she had this in the past.  Past Medical History  Diagnosis Date  . Dementia   . CVA (cerebral vascular accident) ([place])   . COPD (chronic obstructive pulmonary disease) ([facility])     Emphysema  . GERD (gastroesophageal reflux disease)   . Glaucoma   . Hyperlipidemia   . Insomnia   . Rhinitis 12/14/1999  . Depression 10/12/2009  . Cancer ([place]) 07/18/2008    Breast cancer-Left  . Hypertension   . Arthritis 07/18/2008    osteoporosis  . Peripheral neuropathy ([place]) 07/18/2008  . Cerebrovascular disease 08/05/2003    acute  . Lumbago 08/05/2003  . Edema 08/05/2003  . Cellulitis and abscess of leg 08/05/2003  . Peripheral vascular disease ([place]) 08/05/2003  . Anxiety 08/05/2003  . Hypopotassemia 08/05/2003  . Anemia     Past Surgical History  Procedure Laterality Date  . Carotid endarterectomy  2011    Left CEA  .  Eye surgery  07/18/2008    cataract  . Fracture surgery  07/18/2008    closed  part neck femur  . Joint replacement      rt knee  . Breast surgery      rt     Current Outpatient Prescriptions  Medication Sig Dispense Refill  . aspirin EC 81 MG tablet Take 81 mg by mouth daily.    Marland Kitchen atorvastatin (LIPITOR) 20 MG tablet Take 20 mg by mouth daily.    . Brinzolamide-Brimonidine (SIMBRINZA) 1-0.2 % SUSP Place 1 drop into both eyes 2 (two) times daily.     . calcium-vitamin D (OSCAL WITH D) 500-200 MG-UNIT per tablet Take 1 tablet by mouth 2 (two) times daily.     . Cholecalciferol 2000 UNITS CAPS Take 2,000 Units by mouth daily.    . clonazePAM (KLONOPIN) 0.5 MG tablet Take 1 tablet (0.5 mg total) by mouth 3 (three) times daily as needed for anxiety. 30 tablet 0  . cycloSPORINE (RESTASIS) 0.05 % ophthalmic emulsion Place 1 drop into both eyes 2 (two) times daily.    Marland Kitchen dipyridamole-aspirin (AGGRENOX) 25-200 MG per 12 hr capsule Take 1 capsule by mouth 2 (two) times daily.    . DULoxetine (CYMBALTA) 30 MG capsule Take 2 capsules (60 mg total) by mouth daily. Takes with 60 mg for total of 90mg   3  . fexofenadine (ALLEGRA) 180 MG tablet Take  180 mg by mouth daily.    Marland Kitchen gabapentin (NEURONTIN) 400 MG capsule Take 1 capsule (400 mg total) by mouth 3 (three) times daily.    Marland Kitchen ketotifen (ZADITOR) 0.025 % ophthalmic solution Place 1 drop into both eyes 2 (two) times daily. Wait 3-5 minutes between eye meds    . lamoTRIgine (LAMICTAL) 25 MG tablet Take 50 mg by mouth at bedtime.    Marland Kitchen latanoprost (XALATAN) 0.005 % ophthalmic solution Place 1 drop into both eyes at bedtime.    . lidocaine (LIDODERM) 5 % Place 1 patch onto the skin daily. Apply to back  -  Remove & Discard patch within 12 hours or as directed by MD. On for 12 hours off for 12 hours    . meclizine (ANTIVERT) 25 MG tablet Take 25 mg by mouth every 6 (six) hours as needed for dizziness.     . memantine (NAMENDA) 5 MG tablet Take 5 mg by mouth 2  (two) times daily.    . metoprolol tartrate (LOPRESSOR) 25 MG tablet Take 12.5 mg by mouth 2 (two) times daily.    Marland Kitchen omega-3 acid ethyl esters (LOVAZA) 1 G capsule Take 2 g by mouth 2 (two) times daily.    Marland Kitchen omeprazole (PRILOSEC) 20 MG capsule Take 40 mg by mouth 2 (two) times daily before a meal.     . ondansetron (ZOFRAN) 4 MG tablet Take 1 tablet (4 mg total) by mouth every 6 (six) hours. 12 tablet 0  . oxyCODONE (OXY IR/ROXICODONE) 5 MG immediate release tablet Take 1 tablet (5 mg total) by mouth every 4 (four) hours as needed for severe pain. 120 tablet 0  . Polyethyl Glycol-Propyl Glycol (SYSTANE OP) Apply 1 drop to eye every 2 (two) hours.     . polyethylene glycol (MIRALAX / GLYCOLAX) packet Take 17 g by mouth daily.    . risperiDONE (RISPERDAL M-TABS) 1 MG disintegrating tablet Take 0.5 tablets (0.5 mg total) by mouth 2 (two) times daily. (Patient taking differently: Take 1 mg by mouth at bedtime. )    . senna (SENOKOT) 8.6 MG tablet Take 1 tablet by mouth daily.    Marland Kitchen ZOLMitriptan (ZOMIG) 2.5 MG tablet Take 2.5 mg by mouth as needed. For migraine     No current facility-administered medications for this visit.    Allergies:   Lyrica; Morphine and related; Penicillins; and Sulfa antibiotics    Social History:  The patient  reports that she quit smoking about 15 years ago. Her smoking use included Cigarettes. She does not have any smokeless tobacco history on file. She reports that she does not drink alcohol or use illicit drugs.   Family History:  The patient's family history is not on file.    ROS:  Please see the history of present illness.   Otherwise, review of systems are positive for none.   All other systems are reviewed and negative.    PHYSICAL EXAM: VS:  BP 120/70 mmHg  Pulse 73  Ht 5\' 2"  (1.575 m)  Wt 66.679 kg (147 lb)  BMI 26.88 kg/m2 , BMI Body mass index is 26.88 kg/(m^2). GENERAL:  Well appearing HEENT:  Pupils equal round and reactive, fundi not visualized,  oral mucosa unremarkable NECK:  No jugular venous distention, waveform within normal limits, carotid upstroke brisk and symmetric, no bruits, no thyromegaly LYMPHATICS:  No cervical adenopathy LUNGS:  Clear to auscultation bilaterally HEART:  Irregularly irregular.  PMI not displaced or sustained,S1 and S2 within normal limits, no  S3, no S4, no clicks, no rubs, no murmurs ABD:  Flat, positive bowel sounds normal in frequency in pitch, no bruits, no rebound, no guarding, no midline pulsatile mass, no hepatomegaly, no splenomegaly EXT:  2 plus pulses throughout, no edema, no cyanosis no clubbing SKIN:  No rashes no nodules NEURO:  Cranial nerves II through XII grossly intact, motor grossly intact throughout PSYCH:  Cognitively intact, oriented to person place and time    EKG:  EKG is ordered today. The ekg ordered today demonstrates atrial fibrillation. Rate 76 bpm. Right axis deviation.   Recent Labs: 03/22/2014: ALT 13; BUN 18; Creatinine, Ser 0.67; Hemoglobin 15.5*; Platelets 310; Potassium 4.1; Sodium 135    Lipid Panel    Component Value Date/Time   CHOL * 12/03/2009 0610    231        ATP III CLASSIFICATION:  <200     mg/dL   Desirable  200-239  mg/dL   Borderline High  >=240    mg/dL   High          TRIG 332* 12/03/2009 0610   HDL 51 12/03/2009 0610   CHOLHDL 4.5 12/03/2009 0610   VLDL 66* 12/03/2009 0610   LDLCALC * 12/03/2009 0610    114        Total Cholesterol/HDL:CHD Risk Coronary Heart Disease Risk Table                     Men   Women  1/2 Average Risk   3.4   3.3  Average Risk       5.0   4.4  2 X Average Risk   9.6   7.1  3 X Average Risk  23.4   11.0        Use the calculated Patient Ratio above and the CHD Risk Table to determine the patient's CHD Risk.        ATP III CLASSIFICATION (LDL):  <100     mg/dL   Optimal  100-129  mg/dL   Near or Above                    Optimal  130-159  mg/dL   Borderline  160-189  mg/dL   High  >190     mg/dL   Very  High      Wt Readings from Last 3 Encounters:  01/27/15 66.679 kg (147 lb)  09/08/13 58.877 kg (129 lb 12.8 oz)  08/16/13 72.4 kg (159 lb 9.8 oz)      ASSESSMENT AND PLAN:  # Atrial fibrillation: Rate well-controlled today.  Given that her CHA2DS2-VASc score is 5 we will start her on anticoagulation with Xarelto 20 mg daily.   She has previously been on Aggrenox for secondary prevention of stroke. We will discontinue this and start aspirin 81 mg daily.  We will also check a TSH, basic metabolic panel, magnesium, and CBC. We will obtain a transthoracic echo to evaluate for structural heart disease.   Continue metoprolol.  This patients CHA2DS2-VASc Score and unadjusted Ischemic Stroke Rate (% per year) is equal to 7.2 % stroke rate/year from a score of 5  Above score calculated as 1 point each if present [CHF, HTN, DM, Vascular=MI/PAD/Aortic Plaque, Age if 65-74, or Female] Above score calculated as 2 points each if present [Age > 75, or Stroke/TIA/TE]  # Hypertension: Blood pressure well-controlled. Continue metoprolol.     # Hyperlipidemia: Continue atorvastatin.   # Prior CVA: Switching Aggrenox  to aspirin plus Xarelto as above given her new diagnosis of atrial fibrillation.  Current medicines are reviewed at length with the patient today.  The patient does not have concerns regarding medicines.  The following changes have been made:  Stop Aggrenox.  Start Xarelto and aspirin.  Labs/ tests ordered today include:  No orders of the defined types were placed in this encounter.     Disposition:   FU with Jakeel Starliper C. Oval Linsey, MD in 6 months.    Signed, Sharol Harness, MD  01/27/2015 12:09 PM    [place]

## 2015-02-10 ENCOUNTER — Other Ambulatory Visit: Payer: Self-pay

## 2015-02-10 ENCOUNTER — Ambulatory Visit (HOSPITAL_COMMUNITY): Payer: Medicare Other | Attending: Internal Medicine

## 2015-02-10 DIAGNOSIS — R0602 Shortness of breath: Secondary | ICD-10-CM | POA: Diagnosis not present

## 2015-02-10 DIAGNOSIS — I071 Rheumatic tricuspid insufficiency: Secondary | ICD-10-CM | POA: Insufficient documentation

## 2015-02-10 DIAGNOSIS — I4891 Unspecified atrial fibrillation: Secondary | ICD-10-CM | POA: Diagnosis not present

## 2015-02-10 DIAGNOSIS — E785 Hyperlipidemia, unspecified: Secondary | ICD-10-CM | POA: Diagnosis not present

## 2015-02-10 DIAGNOSIS — I10 Essential (primary) hypertension: Secondary | ICD-10-CM | POA: Insufficient documentation

## 2015-02-10 DIAGNOSIS — I340 Nonrheumatic mitral (valve) insufficiency: Secondary | ICD-10-CM | POA: Insufficient documentation

## 2015-02-10 DIAGNOSIS — I358 Other nonrheumatic aortic valve disorders: Secondary | ICD-10-CM | POA: Insufficient documentation

## 2015-02-10 DIAGNOSIS — I480 Paroxysmal atrial fibrillation: Secondary | ICD-10-CM

## 2015-02-10 DIAGNOSIS — I639 Cerebral infarction, unspecified: Secondary | ICD-10-CM | POA: Diagnosis not present

## 2015-02-10 DIAGNOSIS — I351 Nonrheumatic aortic (valve) insufficiency: Secondary | ICD-10-CM | POA: Diagnosis not present

## 2015-02-14 ENCOUNTER — Telehealth: Payer: Self-pay | Admitting: *Deleted

## 2015-02-14 NOTE — Telephone Encounter (Signed)
Patient is a resident at [facility]. Spoke to nurse Jeanine - faxed [phone]) results of echo and last  office note to be placed in patient's chart

## 2015-02-14 NOTE — Telephone Encounter (Signed)
-----   Message from Skeet Latch, MD sent at 02/11/2015  2:51 PM EST ----- Echo shows normal squeezing function and mild leaking of the mitral and tricuspid valves.   Continue current plan without changes.

## 2015-06-02 ENCOUNTER — Emergency Department (HOSPITAL_COMMUNITY): Payer: Medicare Other

## 2015-06-02 ENCOUNTER — Inpatient Hospital Stay (HOSPITAL_COMMUNITY)
Admission: EM | Admit: 2015-06-02 | Discharge: 2015-06-06 | DRG: 871 | Disposition: A | Discharge status: Skilled Nursing Facility | Payer: Medicare Other | Attending: Internal Medicine | Admitting: Internal Medicine

## 2015-06-02 ENCOUNTER — Encounter (HOSPITAL_COMMUNITY): Payer: Self-pay | Admitting: Emergency Medicine

## 2015-06-02 DIAGNOSIS — J209 Acute bronchitis, unspecified: Secondary | ICD-10-CM | POA: Diagnosis present

## 2015-06-02 DIAGNOSIS — G629 Polyneuropathy, unspecified: Secondary | ICD-10-CM | POA: Diagnosis present

## 2015-06-02 DIAGNOSIS — G9340 Encephalopathy, unspecified: Secondary | ICD-10-CM | POA: Diagnosis present

## 2015-06-02 DIAGNOSIS — J441 Chronic obstructive pulmonary disease with (acute) exacerbation: Secondary | ICD-10-CM | POA: Diagnosis present

## 2015-06-02 DIAGNOSIS — J101 Influenza due to other identified influenza virus with other respiratory manifestations: Secondary | ICD-10-CM | POA: Diagnosis present

## 2015-06-02 DIAGNOSIS — F0150 Vascular dementia without behavioral disturbance: Secondary | ICD-10-CM | POA: Diagnosis present

## 2015-06-02 DIAGNOSIS — R410 Disorientation, unspecified: Secondary | ICD-10-CM | POA: Diagnosis present

## 2015-06-02 DIAGNOSIS — G92 Toxic encephalopathy: Secondary | ICD-10-CM | POA: Diagnosis present

## 2015-06-02 DIAGNOSIS — I4891 Unspecified atrial fibrillation: Secondary | ICD-10-CM | POA: Diagnosis present

## 2015-06-02 DIAGNOSIS — F329 Major depressive disorder, single episode, unspecified: Secondary | ICD-10-CM | POA: Diagnosis present

## 2015-06-02 DIAGNOSIS — L8990 Pressure ulcer of unspecified site, unspecified stage: Secondary | ICD-10-CM | POA: Diagnosis not present

## 2015-06-02 DIAGNOSIS — N390 Urinary tract infection, site not specified: Secondary | ICD-10-CM | POA: Diagnosis present

## 2015-06-02 DIAGNOSIS — L89622 Pressure ulcer of left heel, stage 2: Secondary | ICD-10-CM | POA: Diagnosis present

## 2015-06-02 DIAGNOSIS — G4700 Insomnia, unspecified: Secondary | ICD-10-CM | POA: Diagnosis present

## 2015-06-02 DIAGNOSIS — R6510 Systemic inflammatory response syndrome (SIRS) of non-infectious origin without acute organ dysfunction: Secondary | ICD-10-CM | POA: Diagnosis not present

## 2015-06-02 DIAGNOSIS — I482 Chronic atrial fibrillation: Secondary | ICD-10-CM | POA: Diagnosis present

## 2015-06-02 DIAGNOSIS — M1990 Unspecified osteoarthritis, unspecified site: Secondary | ICD-10-CM | POA: Diagnosis present

## 2015-06-02 DIAGNOSIS — Z87891 Personal history of nicotine dependence: Secondary | ICD-10-CM

## 2015-06-02 DIAGNOSIS — J440 Chronic obstructive pulmonary disease with acute lower respiratory infection: Secondary | ICD-10-CM | POA: Diagnosis present

## 2015-06-02 DIAGNOSIS — E785 Hyperlipidemia, unspecified: Secondary | ICD-10-CM | POA: Diagnosis present

## 2015-06-02 DIAGNOSIS — F419 Anxiety disorder, unspecified: Secondary | ICD-10-CM | POA: Diagnosis present

## 2015-06-02 DIAGNOSIS — Z96651 Presence of right artificial knee joint: Secondary | ICD-10-CM | POA: Diagnosis present

## 2015-06-02 DIAGNOSIS — Z79899 Other long term (current) drug therapy: Secondary | ICD-10-CM | POA: Diagnosis not present

## 2015-06-02 DIAGNOSIS — I10 Essential (primary) hypertension: Secondary | ICD-10-CM | POA: Diagnosis present

## 2015-06-02 DIAGNOSIS — Z8673 Personal history of transient ischemic attack (TIA), and cerebral infarction without residual deficits: Secondary | ICD-10-CM | POA: Diagnosis not present

## 2015-06-02 DIAGNOSIS — Z66 Do not resuscitate: Secondary | ICD-10-CM | POA: Diagnosis present

## 2015-06-02 DIAGNOSIS — K219 Gastro-esophageal reflux disease without esophagitis: Secondary | ICD-10-CM | POA: Diagnosis present

## 2015-06-02 DIAGNOSIS — I739 Peripheral vascular disease, unspecified: Secondary | ICD-10-CM | POA: Diagnosis present

## 2015-06-02 DIAGNOSIS — M810 Age-related osteoporosis without current pathological fracture: Secondary | ICD-10-CM | POA: Diagnosis present

## 2015-06-02 DIAGNOSIS — Z7982 Long term (current) use of aspirin: Secondary | ICD-10-CM

## 2015-06-02 DIAGNOSIS — A419 Sepsis, unspecified organism: Secondary | ICD-10-CM

## 2015-06-02 DIAGNOSIS — N3000 Acute cystitis without hematuria: Secondary | ICD-10-CM

## 2015-06-02 DIAGNOSIS — Z853 Personal history of malignant neoplasm of breast: Secondary | ICD-10-CM | POA: Diagnosis not present

## 2015-06-02 DIAGNOSIS — H409 Unspecified glaucoma: Secondary | ICD-10-CM | POA: Diagnosis present

## 2015-06-02 LAB — I-STAT CG4 LACTIC ACID, ED
Lactic Acid, Venous: 1.19 mmol/L (ref 0.5–2.0)
Lactic Acid, Venous: 0.97 mmol/L (ref 0.5–2.0)

## 2015-06-02 LAB — COMPREHENSIVE METABOLIC PANEL
ALT: 9 U/L — ABNORMAL LOW (ref 14–54)
AST: 18 U/L (ref 15–41)
Albumin: 2.9 g/dL — ABNORMAL LOW (ref 3.5–5.0)
Alkaline Phosphatase: 73 U/L (ref 38–126)
Anion gap: 11 (ref 5–15)
BUN: 11 mg/dL (ref 6–20)
CALCIUM: 8.9 mg/dL (ref 8.9–10.3)
CO2: 26 mmol/L (ref 22–32)
CREATININE: 0.95 mg/dL (ref 0.44–1.00)
Chloride: 100 mmol/L — ABNORMAL LOW (ref 101–111)
GFR calc non Af Amer: 54 mL/min — ABNORMAL LOW (ref >60)
GLUCOSE: 125 mg/dL — AB (ref 65–99)
Potassium: 4.1 mmol/L (ref 3.5–5.1)
SODIUM: 137 mmol/L (ref 135–145)
Total Bilirubin: 0.6 mg/dL (ref 0.3–1.2)
Total Protein: 6 g/dL — ABNORMAL LOW (ref 6.5–8.1)

## 2015-06-02 LAB — CBC
HCT: 38 % (ref 36.0–46.0)
Hemoglobin: 12.4 g/dL (ref 12.0–15.0)
MCH: 30.5 pg (ref 26.0–34.0)
MCHC: 32.6 g/dL (ref 30.0–36.0)
MCV: 93.6 fL (ref 78.0–100.0)
PLATELETS: 175 10*3/uL (ref 150–400)
RBC: 4.06 MIL/uL (ref 3.87–5.11)
RDW: 14.3 % (ref 11.5–15.5)
WBC: 5.6 10*3/uL (ref 4.0–10.5)

## 2015-06-02 LAB — DIFFERENTIAL
BASOS PCT: 0 %
Basophils Absolute: 0 10*3/uL (ref 0.0–0.1)
EOS PCT: 0 %
Eosinophils Absolute: 0 10*3/uL (ref 0.0–0.7)
Lymphocytes Relative: 11 %
Lymphs Abs: 0.6 10*3/uL — ABNORMAL LOW (ref 0.7–4.0)
MONO ABS: 0.5 10*3/uL (ref 0.1–1.0)
Monocytes Relative: 8 %
Neutro Abs: 4.5 10*3/uL (ref 1.7–7.7)
Neutrophils Relative %: 81 %

## 2015-06-02 LAB — URINALYSIS, ROUTINE W REFLEX MICROSCOPIC
BILIRUBIN URINE: NEGATIVE
Glucose, UA: NEGATIVE mg/dL
HGB URINE DIPSTICK: NEGATIVE
KETONES UR: NEGATIVE mg/dL
Nitrite: POSITIVE — AB
PROTEIN: NEGATIVE mg/dL
Specific Gravity, Urine: 1.016 (ref 1.005–1.030)
pH: 7 (ref 5.0–8.0)

## 2015-06-02 LAB — URINE MICROSCOPIC-ADD ON

## 2015-06-02 LAB — PROCALCITONIN

## 2015-06-02 MED ORDER — SODIUM CHLORIDE 0.9 % IV BOLUS (SEPSIS)
1000.0000 mL | Freq: Once | INTRAVENOUS | Status: AC
  Administered 2015-06-02: 1000 mL via INTRAVENOUS

## 2015-06-02 MED ORDER — IPRATROPIUM BROMIDE 0.02 % IN SOLN
0.5000 mg | RESPIRATORY_TRACT | Status: DC
Start: 2015-06-03 — End: 2015-06-05
  Administered 2015-06-02 – 2015-06-05 (×13): 0.5 mg via RESPIRATORY_TRACT
  Filled 2015-06-02 (×14): qty 2.5

## 2015-06-02 MED ORDER — ACETAMINOPHEN 650 MG RE SUPP: 650.0000 mg | Freq: Four times a day (QID) | RECTAL | Status: DC | PRN

## 2015-06-02 MED ORDER — LATANOPROST 0.005 % OP SOLN
1.0000 [drp] | Freq: Every day | OPHTHALMIC | Status: DC
  Administered 2015-06-03 – 2015-06-05 (×4): 1 [drp] via OPHTHALMIC
  Filled 2015-06-02 (×2): qty 2.5

## 2015-06-02 MED ORDER — DEXTROSE 5 % IV SOLN
500.0000 mg | INTRAVENOUS | Status: DC
  Administered 2015-06-03: 500 mg via INTRAVENOUS
  Filled 2015-06-02 (×2): qty 500

## 2015-06-02 MED ORDER — LEVALBUTEROL HCL 0.63 MG/3ML IN NEBU
0.6300 mg | INHALATION_SOLUTION | RESPIRATORY_TRACT | Status: DC
  Administered 2015-06-02 – 2015-06-05 (×13): 0.63 mg via RESPIRATORY_TRACT
  Filled 2015-06-02 (×14): qty 3

## 2015-06-02 MED ORDER — SODIUM CHLORIDE 0.9% FLUSH
3.0000 mL | Freq: Two times a day (BID) | INTRAVENOUS | Status: DC
  Administered 2015-06-03 – 2015-06-06 (×6): 3 mL via INTRAVENOUS

## 2015-06-02 MED ORDER — MOMETASONE FURO-FORMOTEROL FUM 100-5 MCG/ACT IN AERO
2.0000 | INHALATION_SPRAY | Freq: Two times a day (BID) | RESPIRATORY_TRACT | Status: DC
  Administered 2015-06-02 – 2015-06-06 (×7): 2 via RESPIRATORY_TRACT
  Filled 2015-06-02: qty 8.8

## 2015-06-02 MED ORDER — ONDANSETRON HCL 4 MG/2ML IJ SOLN: 4.0000 mg | Freq: Four times a day (QID) | INTRAMUSCULAR | Status: DC | PRN

## 2015-06-02 MED ORDER — ONDANSETRON HCL 4 MG PO TABS: 4.0000 mg | ORAL_TABLET | Freq: Four times a day (QID) | ORAL | Status: DC | PRN

## 2015-06-02 MED ORDER — PANTOPRAZOLE SODIUM 40 MG PO TBEC
40.0000 mg | DELAYED_RELEASE_TABLET | Freq: Two times a day (BID) | ORAL | Status: DC
  Administered 2015-06-03 – 2015-06-06 (×7): 40 mg via ORAL
  Filled 2015-06-02 (×8): qty 1

## 2015-06-02 MED ORDER — ACETAMINOPHEN 325 MG PO TABS
650.0000 mg | ORAL_TABLET | Freq: Once | ORAL | Status: AC
  Administered 2015-06-02: 650 mg via ORAL
  Filled 2015-06-02: qty 2

## 2015-06-02 MED ORDER — IPRATROPIUM-ALBUTEROL 0.5-2.5 (3) MG/3ML IN SOLN
3.0000 mL | Freq: Once | RESPIRATORY_TRACT | Status: AC
Start: 2015-06-02 — End: 2015-06-02
  Administered 2015-06-02: 3 mL via RESPIRATORY_TRACT
  Filled 2015-06-02: qty 3

## 2015-06-02 MED ORDER — DEXTROSE 5 % IV SOLN
1.0000 g | Freq: Once | INTRAVENOUS | Status: AC
  Administered 2015-06-02: 1 g via INTRAVENOUS
  Filled 2015-06-02: qty 10

## 2015-06-02 MED ORDER — SODIUM CHLORIDE 0.9 % IV SOLN
INTRAVENOUS | Status: DC
  Administered 2015-06-03: 01:00:00 via INTRAVENOUS

## 2015-06-02 MED ORDER — ATORVASTATIN CALCIUM 20 MG PO TABS
20.0000 mg | ORAL_TABLET | Freq: Every day | ORAL | Status: DC
  Administered 2015-06-03 – 2015-06-06 (×5): 20 mg via ORAL
  Filled 2015-06-02 (×5): qty 1

## 2015-06-02 MED ORDER — MEMANTINE HCL 10 MG PO TABS
5.0000 mg | ORAL_TABLET | Freq: Two times a day (BID) | ORAL | Status: DC
  Administered 2015-06-03 – 2015-06-06 (×8): 5 mg via ORAL
  Filled 2015-06-02 (×8): qty 1

## 2015-06-02 MED ORDER — METOPROLOL TARTRATE 12.5 MG HALF TABLET
12.5000 mg | ORAL_TABLET | Freq: Two times a day (BID) | ORAL | Status: DC
  Administered 2015-06-03 – 2015-06-06 (×8): 12.5 mg via ORAL
  Filled 2015-06-02 (×8): qty 1

## 2015-06-02 MED ORDER — CLONAZEPAM 0.5 MG PO TABS
0.5000 mg | ORAL_TABLET | Freq: Three times a day (TID) | ORAL | Status: DC | PRN
Start: 2015-06-02 — End: 2015-06-06

## 2015-06-02 MED ORDER — BENZONATATE 100 MG PO CAPS
100.0000 mg | ORAL_CAPSULE | Freq: Three times a day (TID) | ORAL | Status: DC
  Administered 2015-06-03 – 2015-06-06 (×11): 100 mg via ORAL
  Filled 2015-06-02 (×11): qty 1

## 2015-06-02 MED ORDER — CEFTRIAXONE SODIUM 1 G IJ SOLR
1.0000 g | INTRAMUSCULAR | Status: DC
  Administered 2015-06-03 – 2015-06-05 (×3): 1 g via INTRAVENOUS
  Filled 2015-06-02 (×4): qty 10

## 2015-06-02 MED ORDER — ACETAMINOPHEN 325 MG PO TABS
650.0000 mg | ORAL_TABLET | Freq: Four times a day (QID) | ORAL | Status: DC | PRN
  Administered 2015-06-03 – 2015-06-04 (×2): 650 mg via ORAL
  Filled 2015-06-02 (×2): qty 2

## 2015-06-02 NOTE — ED Provider Notes (Signed)
CSN: [id number]     Arrival date & time 06/02/15  1610 History   First MD Initiated Contact with Patient 06/02/15 1623     Chief Complaint  Patient presents with  . Altered Mental Status     (Consider location/radiation/quality/duration/timing/severity/associated sxs/prior Treatment) HPI Level 5 caveat due to dementia and altered mental status. 82 year old female who presents from nursing home with altered mental status. She has a history of dementia, CVA, COPD, hyperlipidemia, hypertension,. History had been provided by family who states that she has more recently been altered. This is similar to when she has had UTI in the past. Past Medical History  Diagnosis Date  . Dementia   . CVA (cerebral vascular accident) ([place])   . COPD (chronic obstructive pulmonary disease) ([facility])     Emphysema  . GERD (gastroesophageal reflux disease)   . Glaucoma   . Hyperlipidemia   . Insomnia   . Rhinitis 12/14/1999  . Depression 10/12/2009  . Cancer ([place]) 07/18/2008    Breast cancer-Left  . Hypertension   . Arthritis 07/18/2008    osteoporosis  . Peripheral neuropathy ([place]) 07/18/2008  . Cerebrovascular disease 08/05/2003    acute  . Lumbago 08/05/2003  . Edema 08/05/2003  . Cellulitis and abscess of leg 08/05/2003  . Peripheral vascular disease ([place]) 08/05/2003  . Anxiety 08/05/2003  . Hypopotassemia 08/05/2003  . Anemia    Past Surgical History  Procedure Laterality Date  . Carotid endarterectomy  2011    Left CEA  . Eye surgery  07/18/2008    cataract  . Fracture surgery  07/18/2008    closed  part neck femur  . Joint replacement      rt knee  . Breast surgery      rt   History reviewed. No pertinent family history. Social History  Substance Use Topics  . Smoking status: Former Smoker    Types: Cigarettes    Quit date: 03/16/1999  . Smokeless tobacco: None  . Alcohol Use: No   OB History    No data available     Review of Systems  Unable to perform ROS: Mental status change       Allergies  Lyrica; Morphine and related; Penicillins; and Sulfa antibiotics  Home Medications   Prior to Admission medications   Medication Sig Start Date End Date Taking? Authorizing Provider  aspirin EC 81 MG tablet Take 81 mg by mouth daily.   Yes Historical Provider, MD  atorvastatin (LIPITOR) 20 MG tablet Take 20 mg by mouth daily.   Yes Historical Provider, MD  bimatoprost (LUMIGAN) 0.01 % SOLN Place 1 drop into the right eye at bedtime.   Yes Historical Provider, MD  Brinzolamide-Brimonidine [facility]) 1-0.2 % SUSP Place 1 drop into the right eye 3 (three) times daily.    Yes Historical Provider, MD  calcium-vitamin D (OSCAL WITH D) 500-200 MG-UNIT per tablet Take 1 tablet by mouth 2 (two) times daily.    Yes Historical Provider, MD  Cholecalciferol 2000 UNITS CAPS Take 2,000 Units by mouth daily.   Yes Historical Provider, MD  clonazePAM (KLONOPIN) 0.5 MG tablet Take 1 tablet (0.5 mg total) by mouth 3 (three) times daily as needed for anxiety. 09/07/13  Yes Kinnie Feil, MD  cycloSPORINE (RESTASIS) 0.05 % ophthalmic emulsion Place 1 drop into both eyes 2 (two) times daily.   Yes Historical Provider, MD  dipyridamole-aspirin (AGGRENOX) 200-25 MG 12hr capsule Take 1 capsule by mouth 2 (two) times daily.   Yes Historical Provider,  MD  DULoxetine (CYMBALTA) 30 MG capsule Take 2 capsules (60 mg total) by mouth daily. Takes with 60 mg for total of 90mg  09/07/13  Yes Kinnie Feil, MD  fexofenadine (ALLEGRA) 180 MG tablet Take 180 mg by mouth daily.   Yes Historical Provider, MD  gabapentin (NEURONTIN) 400 MG capsule Take 1 capsule (400 mg total) by mouth 3 (three) times daily. 09/07/13  Yes Kinnie Feil, MD  ketotifen (ZADITOR) 0.025 % ophthalmic solution Place 1 drop into both eyes 2 (two) times daily. Wait 3-5 minutes between eye meds   Yes Historical Provider, MD  lamoTRIgine (LAMICTAL) 150 MG tablet Take 150 mg by mouth 2 (two) times daily.   Yes Historical Provider, MD   lidocaine (LIDODERM) 5 % Place 1 patch onto the skin daily. Apply to back  -  Remove & Discard patch within 12 hours or as directed by MD. On for 12 hours off for 12 hours   Yes Historical Provider, MD  meclizine (ANTIVERT) 25 MG tablet Take 25 mg by mouth every 6 (six) hours as needed for dizziness.    Yes Historical Provider, MD  memantine (NAMENDA) 5 MG tablet Take 5 mg by mouth 2 (two) times daily.   Yes Historical Provider, MD  metoprolol tartrate (LOPRESSOR) 25 MG tablet Take 12.5 mg by mouth 2 (two) times daily.   Yes Historical Provider, MD  omega-3 acid ethyl esters (LOVAZA) 1 G capsule Take 2 g by mouth 2 (two) times daily.   Yes Historical Provider, MD  omeprazole (PRILOSEC) 20 MG capsule Take 40 mg by mouth 2 (two) times daily before a meal.    Yes Historical Provider, MD  oxycodone (OXY-IR) 5 MG capsule Take 5 mg by mouth every 4 (four) hours as needed for pain.   Yes Historical Provider, MD  Polyethyl Glycol-Propyl Glycol (SYSTANE OP) Place 1 drop into both eyes 4 (four) times daily.    Yes Historical Provider, MD  polyethylene glycol (MIRALAX / GLYCOLAX) packet Take 17 g by mouth daily.   Yes Historical Provider, MD  risperiDONE (RISPERDAL M-TABS) 1 MG disintegrating tablet Take 1 mg by mouth 2 (two) times daily.   Yes Historical Provider, MD  rivaroxaban (XARELTO) 20 MG TABS tablet Take 1 tablet (20 mg total) by mouth daily with supper. 01/27/15  Yes Skeet Latch, MD  senna (SENOKOT) 8.6 MG tablet Take 1 tablet by mouth 2 (two) times daily.    Yes Historical Provider, MD  traZODone (DESYREL) 50 MG tablet Take 50 mg by mouth at bedtime.   Yes Historical Provider, MD  ZOLMitriptan (ZOMIG) 2.5 MG tablet Take 2.5 mg by mouth as needed. For migraine   Yes Historical Provider, MD  ondansetron (ZOFRAN) 4 MG tablet Take 1 tablet (4 mg total) by mouth every 6 (six) hours. Patient not taking: Reported on 06/02/2015 03/23/14   Nat Christen, MD  oxyCODONE (OXY IR/ROXICODONE) 5 MG immediate  release tablet Take 1 tablet (5 mg total) by mouth every 4 (four) hours as needed for severe pain. Patient not taking: Reported on 06/02/2015 09/17/13   Tiffany L Reed, DO  risperiDONE (RISPERDAL M-TABS) 1 MG disintegrating tablet Take 0.5 tablets (0.5 mg total) by mouth 2 (two) times daily. Patient not taking: Reported on 06/02/2015 09/07/13   Kinnie Feil, MD   BP 138/63 mmHg  Pulse 104  Temp(Src) 101.5 F (38.6 C) (Rectal)  Resp 21  Ht 5\' 5"  (1.651 m)  Wt 150 lb (68.04 kg)  BMI 24.96 kg/m2  SpO2 98% Physical Exam Physical Exam  Nursing note and vitals reviewed. Constitutional:Elderly woman, appears listless, non-toxic Head: Normocephalic and atraumatic.  Mouth/Throat: Oropharynx is dry.  Neck: Normal range of motion. Neck supple.  Cardiovascular: Tachycardic rate and regular rhythm.  No edema. Pulmonary/Chest: Effort normal. Coarse breath sounds throughout. Abdominal: Soft. There is no tenderness. There is no rebound and no guarding.  Musculoskeletal: Normal range of motion.  Neurological: somnolent, opens eyes to command, obeys simple commands, moves all extremities, no facial droop, answers simple questions, oriented to self and location Skin: Skin is warm and dry.  Psychiatric: Cooperative  ED Course  Procedures (including critical care time) Labs Review Labs Reviewed  COMPREHENSIVE METABOLIC PANEL - Abnormal; Notable for the following:    Chloride 100 (*)    Glucose, Bld 125 (*)    Total Protein 6.0 (*)    Albumin 2.9 (*)    ALT 9 (*)    GFR calc non Af Amer 54 (*)    All other components within normal limits  URINALYSIS, ROUTINE W REFLEX MICROSCOPIC (NOT AT [facility]) - Abnormal; Notable for the following:    APPearance CLOUDY (*)    Nitrite POSITIVE (*)    Leukocytes, UA SMALL (*)    All other components within normal limits  DIFFERENTIAL - Abnormal; Notable for the following:    Lymphs Abs 0.6 (*)    All other components within normal limits  URINE MICROSCOPIC-ADD  ON - Abnormal; Notable for the following:    Squamous Epithelial / LPF 0-5 (*)    Bacteria, UA MANY (*)    All other components within normal limits  CULTURE, BLOOD (ROUTINE X 2)  CULTURE, BLOOD (ROUTINE X 2)  URINE CULTURE  CBC  INFLUENZA PANEL BY PCR (TYPE A & B, H1N1)  I-STAT CG4 LACTIC ACID, ED    Imaging Review Dg Chest Portable 1 View  06/02/2015  CLINICAL DATA:  Hypoxia. Altered mental status. Urinary tract infection. Dementia. EXAM: PORTABLE CHEST 1 VIEW COMPARISON:  06/06/2013 FINDINGS: The heart size and mediastinal contours are within normal limits allowing for low lung volumes. Both lungs are clear. No pneumothorax or pleural effusion visualized. Mild atherosclerotic calcification of thoracic aorta noted. IMPRESSION: No active disease. Electronically Signed   By: Earle Gell M.D.   On: 06/02/2015 17:09   I have personally reviewed and evaluated these images and lab results as part of my medical decision-making.   EKG Interpretation   Date/Time:  Monday June 02 2015 17:12:28 EDT Ventricular Rate:  94 PR Interval:    QRS Duration: 88 QT Interval:  373 QTC Calculation: 466 R Axis:   -8 Text Interpretation:  Atrial fibrillation Probable anteroseptal infarct,  old H/o of atrial fibrillation No significant change since last tracing  Confirmed by Maika Mcelveen MD, Tane Biegler [phone]) on 06/02/2015 5:35:30 PM      MDM   Final diagnoses:  Delirium  Acute cystitis without hematuria  Sepsis, due to unspecified organism [facility])    82 year old female who presents from nursing facility with altered mental status. He is febrile, tachycardic, and has 2-3 L oxygen requirement on arrival. Presentation concerning for sepsis with delirium. UTI is likely source, as catheterized specimen is positive for nitrites, leukocytes, wbc's and bacteria. She has no major electrolyte or metabolic derangements, and organ injury, leukocytosis or lactic acidosis. Chest x-ray is clear, but she does have coarse breath  sounds throughout with oxygen requirement. Given duo nebs, and swab for flu. Received IV fluids, Tylenol, and ceftriaxone for  treatment. Tachycardia improved with Tylenol and IV fluids. Discussed with [facility] hospitalist will admit for ongoing management. Dr. Abran Richard to accept.    Forde Dandy, MD 06/02/15 [phone]

## 2015-06-02 NOTE — H&P (Signed)
History and Physical        Hospital Admission Note Date: 06/02/2015  Patient name: Dorothy Collins Medical record number: [id number] Date of birth: [birth year] Age: 82 y.o. Gender: female  PCP: Wenda Low, MD  Referring physician: Dr Brantley Stage  Chief Complaint:  Altered mental status  HPI: Patient is a 82 year old female with history of dementia, in skilled nursing facility, prior CVA, COPD, GERD, hyperlipidemia, hypertension, arthritis presented to ED from the skilled nursing facility [facility]) with altered mental status. Patient is unable to provide any history and no family member is present at the bedside. Patient was sent to the ED for evaluation as she was not acting like herself. She has prior history of UTI and has similar presentation per family who reported to [place]. During my encounter, patient is oriented to self and does not know why she is in the hospital. She also is having coughing and wheezing. In ED, patient was febrile temperature and 1.5, pulse 102, respiratory rate 15, BP 106/73, O2 sats 100% on 3 L. Nasal cannula BMET unremarkable except albumin 2.9 lactic acid 1.1 CBC unremarkable Chest x-ray showed no active disease, UA positive for urinary tract infection.   Review of Systems:  Unable to obtain from the patient due to her mental status and dementia  Past Medical History: Past Medical History  Diagnosis Date  . Dementia   . CVA (cerebral vascular accident) ([place])   . COPD (chronic obstructive pulmonary disease) ([facility])     Emphysema  . GERD (gastroesophageal reflux disease)   . Glaucoma   . Hyperlipidemia   . Insomnia   . Rhinitis 12/14/1999  . Depression 10/12/2009  . Cancer ([place]) 07/18/2008    Breast cancer-Left  . Hypertension   . Arthritis 07/18/2008    osteoporosis  . Peripheral neuropathy ([place]) 07/18/2008  . Cerebrovascular disease 08/05/2003   acute  . Lumbago 08/05/2003  . Edema 08/05/2003  . Cellulitis and abscess of leg 08/05/2003  . Peripheral vascular disease ([place]) 08/05/2003  . Anxiety 08/05/2003  . Hypopotassemia 08/05/2003  . Anemia     Past Surgical History  Procedure Laterality Date  . Carotid endarterectomy  2011    Left CEA  . Eye surgery  07/18/2008    cataract  . Fracture surgery  07/18/2008    closed  part neck femur  . Joint replacement      rt knee  . Breast surgery      rt    Medications:All the medications need to be verified by the pharmacy  Prior to Admission medications   Medication Sig Start Date End Date Taking? Authorizing Provider  aspirin EC 81 MG tablet Take 81 mg by mouth daily.   Yes Historical Provider, MD  atorvastatin (LIPITOR) 20 MG tablet Take 20 mg by mouth daily.   Yes Historical Provider, MD  bimatoprost (LUMIGAN) 0.01 % SOLN Place 1 drop into the right eye at bedtime.   Yes Historical Provider, MD  Brinzolamide-Brimonidine [facility]) 1-0.2 % SUSP Place 1 drop into the right eye 3 (three) times daily.    Yes Historical Provider, MD  calcium-vitamin D (OSCAL WITH D) 500-200 MG-UNIT per tablet Take 1 tablet  by mouth 2 (two) times daily.    Yes Historical Provider, MD  Cholecalciferol 2000 UNITS CAPS Take 2,000 Units by mouth daily.   Yes Historical Provider, MD  clonazePAM (KLONOPIN) 0.5 MG tablet Take 1 tablet (0.5 mg total) by mouth 3 (three) times daily as needed for anxiety. 09/07/13  Yes Kinnie Feil, MD  cycloSPORINE (RESTASIS) 0.05 % ophthalmic emulsion Place 1 drop into both eyes 2 (two) times daily.   Yes Historical Provider, MD  dipyridamole-aspirin (AGGRENOX) 200-25 MG 12hr capsule Take 1 capsule by mouth 2 (two) times daily.   Yes Historical Provider, MD  DULoxetine (CYMBALTA) 30 MG capsule Take 2 capsules (60 mg total) by mouth daily. Takes with 60 mg for total of 90mg  09/07/13  Yes Kinnie Feil, MD  fexofenadine (ALLEGRA) 180 MG tablet Take 180 mg by mouth daily.   Yes  Historical Provider, MD  gabapentin (NEURONTIN) 400 MG capsule Take 1 capsule (400 mg total) by mouth 3 (three) times daily. 09/07/13  Yes Kinnie Feil, MD  ketotifen (ZADITOR) 0.025 % ophthalmic solution Place 1 drop into both eyes 2 (two) times daily. Wait 3-5 minutes between eye meds   Yes Historical Provider, MD  lamoTRIgine (LAMICTAL) 150 MG tablet Take 150 mg by mouth 2 (two) times daily.   Yes Historical Provider, MD  lidocaine (LIDODERM) 5 % Place 1 patch onto the skin daily. Apply to back  -  Remove & Discard patch within 12 hours or as directed by MD. On for 12 hours off for 12 hours   Yes Historical Provider, MD  meclizine (ANTIVERT) 25 MG tablet Take 25 mg by mouth every 6 (six) hours as needed for dizziness.    Yes Historical Provider, MD  memantine (NAMENDA) 5 MG tablet Take 5 mg by mouth 2 (two) times daily.   Yes Historical Provider, MD  metoprolol tartrate (LOPRESSOR) 25 MG tablet Take 12.5 mg by mouth 2 (two) times daily.   Yes Historical Provider, MD  omega-3 acid ethyl esters (LOVAZA) 1 G capsule Take 2 g by mouth 2 (two) times daily.   Yes Historical Provider, MD  omeprazole (PRILOSEC) 20 MG capsule Take 40 mg by mouth 2 (two) times daily before a meal.    Yes Historical Provider, MD  oxycodone (OXY-IR) 5 MG capsule Take 5 mg by mouth every 4 (four) hours as needed for pain.   Yes Historical Provider, MD  Polyethyl Glycol-Propyl Glycol (SYSTANE OP) Place 1 drop into both eyes 4 (four) times daily.    Yes Historical Provider, MD  polyethylene glycol (MIRALAX / GLYCOLAX) packet Take 17 g by mouth daily.   Yes Historical Provider, MD  risperiDONE (RISPERDAL M-TABS) 1 MG disintegrating tablet Take 1 mg by mouth 2 (two) times daily.   Yes Historical Provider, MD  rivaroxaban (XARELTO) 20 MG TABS tablet Take 1 tablet (20 mg total) by mouth daily with supper. 01/27/15  Yes Skeet Latch, MD  senna (SENOKOT) 8.6 MG tablet Take 1 tablet by mouth 2 (two) times daily.    Yes Historical  Provider, MD  traZODone (DESYREL) 50 MG tablet Take 50 mg by mouth at bedtime.   Yes Historical Provider, MD  ZOLMitriptan (ZOMIG) 2.5 MG tablet Take 2.5 mg by mouth as needed. For migraine   Yes Historical Provider, MD  ondansetron (ZOFRAN) 4 MG tablet Take 1 tablet (4 mg total) by mouth every 6 (six) hours. Patient not taking: Reported on 06/02/2015 03/23/14   Nat Christen, MD  oxyCODONE (OXY  IR/ROXICODONE) 5 MG immediate release tablet Take 1 tablet (5 mg total) by mouth every 4 (four) hours as needed for severe pain. Patient not taking: Reported on 06/02/2015 09/17/13   Tiffany L Reed, DO  risperiDONE (RISPERDAL M-TABS) 1 MG disintegrating tablet Take 0.5 tablets (0.5 mg total) by mouth 2 (two) times daily. Patient not taking: Reported on 06/02/2015 09/07/13   Kinnie Feil, MD    Allergies:   Allergies  Allergen Reactions  . Lyrica [Pregabalin]     unk  . Morphine And Related     unk  . Penicillins     unk  . Sulfa Antibiotics     unk    Social History:  Per records, reports that she quit smoking about 16 years ago. Her smoking use included Cigarettes. She does not have any smokeless tobacco history on file. She reports that she does not drink alcohol or use illicit drugs.  Family History: No family member at the bedside, unable to review with the patient due to altered mental status and dementia  Physical Exam: Blood pressure 106/73, pulse 102, temperature 101.5 F (38.6 C), temperature source Rectal, resp. rate 15, height 5\' 5"  (1.651 m), weight 68.04 kg (150 lb), SpO2 100 %. General: Alert, awake, oriented x1 to self, confused, coughing. HEENT: normocephalic, atraumatic, anicteric sclera, pink conjunctiva, pupils equal and reactive to light and accomodation, oropharynx clear Neck: supple, no masses or lymphadenopathy, no goiter, no bruits  Heart: Regular rate and rhythm, without murmurs, rubs or gallops. Lungs: diffuse rhonchi with scattered wheezing  Abdomen: Soft, nontender,  nondistended, positive bowel sounds, no masses. Extremities: No clubbing, cyanosis or edema with positive pedal pulses. Neuro:  strength 5/5 upper and lower extremities bilaterally Psych: alert and oriented x 1, confused Skin: small scabs and bruises on the face    LABS on Admission:  Basic Metabolic Panel:  Recent Labs Lab 06/02/15 1707  NA 137  K 4.1  CL 100*  CO2 26  GLUCOSE 125*  BUN 11  CREATININE 0.95  CALCIUM 8.9   Liver Function Tests:  Recent Labs Lab 06/02/15 1707  AST 18  ALT 9*  ALKPHOS 73  BILITOT 0.6  PROT 6.0*  ALBUMIN 2.9*   No results for input(s): LIPASE, AMYLASE in the last 168 hours. No results for input(s): AMMONIA in the last 168 hours. CBC:  Recent Labs Lab 06/02/15 1707  WBC 5.6  NEUTROABS 4.5  HGB 12.4  HCT 38.0  MCV 93.6  PLT 175   Cardiac Enzymes: No results for input(s): CKTOTAL, CKMB, CKMBINDEX, TROPONINI in the last 168 hours. BNP: Invalid input(s): POCBNP CBG: No results for input(s): GLUCAP in the last 168 hours.  Radiological Exams on Admission:  Dg Chest Portable 1 View  06/02/2015  CLINICAL DATA:  Hypoxia. Altered mental status. Urinary tract infection. Dementia. EXAM: PORTABLE CHEST 1 VIEW COMPARISON:  06/06/2013 FINDINGS: The heart size and mediastinal contours are within normal limits allowing for low lung volumes. Both lungs are clear. No pneumothorax or pleural effusion visualized. Mild atherosclerotic calcification of thoracic aorta noted. IMPRESSION: No active disease. Electronically Signed   By: Earle Gell M.D.   On: 06/02/2015 17:09    *I have personally reviewed the images above*  EKG: Independently reviewed. Rate 94, atrial fibrillation , QTC 466   Assessment/Plan Principal Problem:   Acute encephalopathy likely due to UTI, acute bronchitis/COPD exacerbation, superimposed on dementia, SIRS (fever, tachycardia, source of infection UTI) - Currently vital signs stable, admit to telemetry, place  on gentle  hydration - Obtain pro-calcitonin, lactic acid normal, obtain influenza panel, sputum cultures, blood cultures - For now placed on IV Rocephin to cover for UTI, IV Zithromax for acute COPD exacerbation - For now placed on soft diet, swallowing evaluation (per transfer records, she is on regular diet)  Active Problems:   GERD (gastroesophageal reflux disease) - Place on PPI    Vascular dementia  - Awaiting pharmacy med rec, will place on psych medications once medications reconciled    A-fib ([place]) - Currently mild tachycardia, likely due to #1 - Per prior medical record, patient appears to be on anticoagulation, will need to be reconciled    UTI (lower urinary tract infection),   SIRS (systemic inflammatory response syndrome) ([place]) - Placed on IV Rocephin, follow urine culture , blood cultures    COPD exacerbation ([place]) - Placed on scheduled bronchodilators with Xopenex and Atrovent, dulera, flutter valve, Tessalon Perles, IV Zithromax  DVT prophylaxis: SCD's   CODE STATUS: full code   Family Communication: No family member is present at the bedside   Disposition plan: Further plan will depend as patient's clinical course evolves and further radiologic and laboratory data become available. [place] back to skilled nursing facility once medically stable  Time spent on Admission: 82mins   Moriyah Byington M.D. [facility] 06/02/2015, 7:01 PM Pager: [phone]  If 7PM-7AM, please contact night-coverage [email or web address] Password [email or web address]

## 2015-06-02 NOTE — Progress Notes (Signed)
Patient transferred from the ED via stretcher to room 3E30. Patient is alert oriented to self and at times environment or place. VSS. Confirmed 2nd verification of telemetry leads and telemetry box with CMT. Currently patient complains of no pain or discomfort. Will continue to monitor patient to end of shift.

## 2015-06-02 NOTE — ED Notes (Signed)
Pt brought in EMS from [facility] for AMS since Friday.  PT has hx of dementia but per facility pt "isn't acting like herself."  Pt has hx of UTI and family reports that presentation is similar.

## 2015-06-03 LAB — CBC
HEMATOCRIT: 34.2 % — AB (ref 36.0–46.0)
HEMOGLOBIN: 10.7 g/dL — AB (ref 12.0–15.0)
MCH: 29.4 pg (ref 26.0–34.0)
MCHC: 31.3 g/dL (ref 30.0–36.0)
MCV: 94 fL (ref 78.0–100.0)
PLATELETS: 173 10*3/uL (ref 150–400)
RBC: 3.64 MIL/uL — AB (ref 3.87–5.11)
RDW: 14.5 % (ref 11.5–15.5)
WBC: 4.8 10*3/uL (ref 4.0–10.5)

## 2015-06-03 LAB — BASIC METABOLIC PANEL
ANION GAP: 8 (ref 5–15)
BUN: 11 mg/dL (ref 6–20)
CHLORIDE: 101 mmol/L (ref 101–111)
CO2: 27 mmol/L (ref 22–32)
Calcium: 8.2 mg/dL — ABNORMAL LOW (ref 8.9–10.3)
Creatinine, Ser: 0.89 mg/dL (ref 0.44–1.00)
GFR calc non Af Amer: 59 mL/min — ABNORMAL LOW (ref >60)
Glucose, Bld: 168 mg/dL — ABNORMAL HIGH (ref 65–99)
POTASSIUM: 5.7 mmol/L — AB (ref 3.5–5.1)
SODIUM: 136 mmol/L (ref 135–145)

## 2015-06-03 LAB — MRSA PCR SCREENING: MRSA by PCR: NEGATIVE

## 2015-06-03 LAB — INFLUENZA PANEL BY PCR (TYPE A & B)
H1N1 flu by pcr: NOT DETECTED
Influenza A By PCR: POSITIVE — AB
Influenza B By PCR: NEGATIVE

## 2015-06-03 MED ORDER — RISPERIDONE 1 MG PO TBDP
1.0000 mg | ORAL_TABLET | Freq: Two times a day (BID) | ORAL | Status: DC
  Administered 2015-06-03 – 2015-06-06 (×7): 1 mg via ORAL
  Filled 2015-06-03 (×10): qty 1

## 2015-06-03 MED ORDER — VANCOMYCIN HCL 500 MG IV SOLR
500.0000 mg | Freq: Two times a day (BID) | INTRAVENOUS | Status: DC
  Filled 2015-06-03 (×2): qty 500

## 2015-06-03 MED ORDER — HYDROCOD POLST-CPM POLST ER 10-8 MG/5ML PO SUER
5.0000 mL | Freq: Once | ORAL | Status: AC
  Administered 2015-06-03: 5 mL via ORAL
  Filled 2015-06-03: qty 5

## 2015-06-03 MED ORDER — PHENOL 1.4 % MT LIQD
1.0000 | OROMUCOSAL | Status: DC | PRN
  Filled 2015-06-03: qty 177

## 2015-06-03 MED ORDER — GABAPENTIN 100 MG PO CAPS
200.0000 mg | ORAL_CAPSULE | Freq: Three times a day (TID) | ORAL | Status: DC
  Administered 2015-06-03 – 2015-06-06 (×10): 200 mg via ORAL
  Filled 2015-06-03 (×10): qty 2

## 2015-06-03 MED ORDER — RIVAROXABAN 20 MG PO TABS
20.0000 mg | ORAL_TABLET | Freq: Every day | ORAL | Status: DC
  Administered 2015-06-03 – 2015-06-05 (×3): 20 mg via ORAL
  Filled 2015-06-03 (×3): qty 1

## 2015-06-03 MED ORDER — CYCLOSPORINE 0.05 % OP EMUL
1.0000 [drp] | Freq: Two times a day (BID) | OPHTHALMIC | Status: DC
  Administered 2015-06-03 – 2015-06-06 (×7): 1 [drp] via OPHTHALMIC
  Filled 2015-06-03 (×9): qty 1

## 2015-06-03 MED ORDER — OSELTAMIVIR PHOSPHATE 75 MG PO CAPS: 75.0000 mg | ORAL_CAPSULE | Freq: Two times a day (BID) | ORAL | Status: DC

## 2015-06-03 MED ORDER — DULOXETINE HCL 60 MG PO CPEP
60.0000 mg | ORAL_CAPSULE | Freq: Every day | ORAL | Status: DC
  Administered 2015-06-03 – 2015-06-06 (×4): 60 mg via ORAL
  Filled 2015-06-03 (×4): qty 1

## 2015-06-03 MED ORDER — KETOTIFEN FUMARATE 0.025 % OP SOLN
1.0000 [drp] | Freq: Two times a day (BID) | OPHTHALMIC | Status: DC
  Administered 2015-06-03 – 2015-06-06 (×7): 1 [drp] via OPHTHALMIC
  Filled 2015-06-03: qty 5

## 2015-06-03 MED ORDER — VANCOMYCIN HCL 10 G IV SOLR
1500.0000 mg | Freq: Once | INTRAVENOUS | Status: AC
  Administered 2015-06-03: 1500 mg via INTRAVENOUS
  Filled 2015-06-03: qty 1500

## 2015-06-03 MED ORDER — LAMOTRIGINE 25 MG PO TABS
150.0000 mg | ORAL_TABLET | Freq: Two times a day (BID) | ORAL | Status: DC
  Administered 2015-06-03 – 2015-06-06 (×7): 150 mg via ORAL
  Filled 2015-06-03 (×7): qty 6

## 2015-06-03 MED ORDER — POLYETHYLENE GLYCOL 3350 17 G PO PACK
17.0000 g | PACK | Freq: Every day | ORAL | Status: DC
  Administered 2015-06-04 – 2015-06-06 (×3): 17 g via ORAL
  Filled 2015-06-03 (×3): qty 1

## 2015-06-03 MED ORDER — OSELTAMIVIR PHOSPHATE 30 MG PO CAPS
30.0000 mg | ORAL_CAPSULE | Freq: Two times a day (BID) | ORAL | Status: DC
  Administered 2015-06-03 – 2015-06-06 (×7): 30 mg via ORAL
  Filled 2015-06-03 (×11): qty 1

## 2015-06-03 NOTE — Evaluation (Signed)
Clinical/Bedside Swallow Evaluation Patient Details  Name: Robinn B Standen MRN: [id number] Date of Birth: 12-03-1930  Today's Date: 06/03/2015 Time: SLP Start Time (ACUTE ONLY): 0930 SLP Stop Time (ACUTE ONLY): 0943 SLP Time Calculation (min) (ACUTE ONLY): 13 min  Past Medical History:  Past Medical History  Diagnosis Date  . Dementia   . CVA (cerebral vascular accident) ([place])   . COPD (chronic obstructive pulmonary disease) ([facility])     Emphysema  . GERD (gastroesophageal reflux disease)   . Glaucoma   . Hyperlipidemia   . Insomnia   . Rhinitis 12/14/1999  . Depression 10/12/2009  . Cancer ([place]) 07/18/2008    Breast cancer-Left  . Hypertension   . Arthritis 07/18/2008    osteoporosis  . Peripheral neuropathy ([place]) 07/18/2008  . Cerebrovascular disease 08/05/2003    acute  . Lumbago 08/05/2003  . Edema 08/05/2003  . Cellulitis and abscess of leg 08/05/2003  . Peripheral vascular disease ([place]) 08/05/2003  . Anxiety 08/05/2003  . Hypopotassemia 08/05/2003  . Anemia    Past Surgical History:  Past Surgical History  Procedure Laterality Date  . Carotid endarterectomy  2011    Left CEA  . Eye surgery  07/18/2008    cataract  . Fracture surgery  07/18/2008    closed  part neck femur  . Joint replacement      rt knee  . Breast surgery      rt   HPI:  Patient is a 82 year old female with history of dementia, in skilled nursing facility, prior CVA, COPD, GERD, hyperlipidemia, hypertension, arthritis presented to ED from the skilled nursing facility [facility]) with altered mental status. In ED, patient was febrile temperature and 101.5, pulse 102, respiratory rate 15, BP 106/73, O2 sats 100% on 3 L. Nasal cannula.  Chest x-ray showed no active disease, UA positive for urinary tract infection.   Assessment / Plan / Recommendation Clinical Impression   Pt presents with grossly intact oropharyngeal swallowing function with timely and effective oral manipulation of boluses and swift swallow  response.  No overt s/s of aspiration evident with thin liquids or purees; solids not trialed as pt reported not eating solid cracker consistencies prior to admission due to missing/broken lower denture plate, although anticipate solids to be [facility] given presentation with other consistencies assessed.  Recommend that pt continue on a regular diet and thin liquids.  No further ST needs are indicated at this time.      Aspiration Risk  Mild aspiration risk    Diet Recommendation Regular;Thin liquid   Liquid Administration via: Cup;Straw Medication Administration: Whole meds with liquid Supervision: Patient able to self feed    Other  Recommendations Oral Care Recommendations: Oral care BID   Follow up Recommendations  None      Swallow Study   General HPI: Patient is a 82 year old female with history of dementia, in skilled nursing facility, prior CVA, COPD, GERD, hyperlipidemia, hypertension, arthritis presented to ED from the skilled nursing facility [facility]) with altered mental status. In ED, patient was febrile temperature and 101.5, pulse 102, respiratory rate 15, BP 106/73, O2 sats 100% on 3 L. Nasal cannula.  Chest x-ray showed no active disease, UA positive for urinary tract infection. Type of Study: Bedside Swallow Evaluation Previous Swallow Assessment: MBS completed in 2008, no report available, per chart review pt on regular textures at SNF  Diet Prior to this Study: Regular;Thin liquids Temperature Spikes Noted: No Respiratory Status: Nasal cannula History of Recent Intubation: No Behavior/Cognition:  Alert;Cooperative;Pleasant mood Oral Cavity Assessment: Within Functional Limits Oral Care Completed by SLP: No Oral Cavity - Dentition: Dentures, top;Other (Comment) (missing bottom plate) Vision: Functional for self-feeding Self-Feeding Abilities: Able to feed self Patient Positioning: Upright in bed Baseline Vocal Quality: Hoarse Volitional Cough: Strong;Congested     Oral/Motor/Sensory Function Overall Oral Motor/Sensory Function: Within functional limits   Ice Chips     Thin Liquid Thin Liquid: Within functional limits Presentation: Cup;Straw    Nectar Thick     Honey Thick     Puree Puree: Within functional limits Presentation: Self Fed;Spoon   Solid   GO            Xoe Hoe, Elmyra Ricks L 06/03/2015,9:50 AM

## 2015-06-03 NOTE — Progress Notes (Signed)
Patient states her throat is hurting really bad and request a throat spray. Text paged hospitalist. Will continue to monitor patient to end of shift.

## 2015-06-03 NOTE — Progress Notes (Signed)
Pharmacy Antibiotic Note  Dorothy Collins is a 82 y.o. female admitted on 06/02/2015 with bacteremia.  Pharmacy has been consulted for Vancomycin dosing. WBC wnl, Tm 102.8  Plan: -Vancomycin 1500 mg IV once, then Vancomycin 500 mg IV every 12 hours.  Goal trough 15-20 mcg/mL. -Monitor CBC, renal fx, cultures and clinical progress   Height: 5\' 2"  (157.5 cm) Weight: 145 lb 9.6 oz (66.044 kg) IBW/kg (Calculated) : 50.1  Temp (24hrs), Avg:99.4 F (37.4 C), Min:98 F (36.7 C), Max:102.8 F (39.3 C)   Recent Labs Lab 06/02/15 1706 06/02/15 1707 06/02/15 2038 06/03/15 0246  WBC  --  5.6  --  4.8  CREATININE  --  0.95  --  0.89  LATICACIDVEN 1.19  --  0.97  --     Estimated Creatinine Clearance: 43.5 mL/min (by C-G formula based on Cr of 0.89).    Allergies  Allergen Reactions  . Lyrica [Pregabalin]     unk  . Morphine And Related     unk  . Penicillins     unk  . Sulfa Antibiotics     unk    Antimicrobials this admission: 3/21 Vanc>>  Dose adjustments this admission: None   Microbiology results: 3/20 BCx2>>1/2 GPC in clusters 3/20 UCx>> ngtd   Thank you for allowing pharmacy to be a part of this patient's care.  Albertina Parr, PharmD., BCPS Clinical Pharmacist Pager [phone]

## 2015-06-03 NOTE — Progress Notes (Signed)
MD made aware of pt's positive bld cx.

## 2015-06-03 NOTE — Progress Notes (Addendum)
[facility] PROGRESS NOTE  Dorothy Collins [id number] DOB: 18-Jan-1931 DOA: 06/02/2015 PCP: Wenda Low, MD  HPI/Brief narrative 82 year old female with history of dementia, in skilled nursing facility, prior CVA, COPD, GERD, hyperlipidemia, hypertension, arthritis presented to ED from the skilled nursing facility [facility]) with altered mental status, fevers, tachycardia  Assessment/Plan: 1. Influenza A 1. Patient tested flu pos 2. Started tamiflu on 3/21 for total of 5 days tx 3. Continue droplet precautions 2. Acute encephalopathy 1. Seems improved this AM 2. Suspect toxic-metabolic encephalopathy related to acute illness 3. Vascular dementia 1. Seems stable 4. A-fib 1. Rate controlled 2. Continue home xarelto for secondary stroke prevention 5. Possible UTI 1. UA with small leuks and pos nitrites in setting of encephalopathy and fevers 2. Pt was started on empiric azithromycin and rocephin per below 3. Will narrow to rocephin alone 6. COPD exacerbation 1. Con t nebs as tolerated 2. Patient was started empiric azithromycin and rocephin 3. CXR is clear 4. Will d/c azithro per above 7. DVT prophylaxis 1. On therapeutic anticoagulation (xarelto)  Code Status: Full Family Communication: Pt in room Disposition Plan: Uncertain at this time   Consultants:    Procedures:    Antibiotics: Anti-infectives    Start     Dose/Rate Route Frequency Ordered Stop   06/03/15 1800  cefTRIAXone (ROCEPHIN) 1 g in dextrose 5 % 50 mL IVPB     1 g 100 mL/hr over 30 Minutes Intravenous Every 24 hours 06/02/15 2146     06/03/15 1000  oseltamivir (TAMIFLU) capsule 30 mg     30 mg Oral 2 times daily 06/03/15 0757 06/08/15 0959   06/03/15 1000  oseltamivir (TAMIFLU) capsule 75 mg  Status:  Discontinued     75 mg Oral 2 times daily 06/03/15 0759 06/03/15 0759   06/02/15 2300  azithromycin (ZITHROMAX) 500 mg in dextrose 5 % 250 mL IVPB     500 mg 250 mL/hr over 60 Minutes  Intravenous Every 24 hours 06/02/15 2146     06/02/15 1745  cefTRIAXone (ROCEPHIN) 1 g in dextrose 5 % 50 mL IVPB     1 g 100 mL/hr over 30 Minutes Intravenous  Once 06/02/15 1736 06/02/15 1827      HPI/Subjective: Feels somewhat better today  Objective: Filed Vitals:   06/03/15 1011 06/03/15 1332 06/03/15 1555 06/03/15 1607  BP:  126/66  148/84  Pulse:  91  111  Temp:  99.4 F (37.4 C)  102.8 F (39.3 C)  TempSrc:  Oral  Oral  Resp:  18  18  Height:      Weight:      SpO2: 96% 99% 98% 100%    Intake/Output Summary (Last 24 hours) at 06/03/15 1712 Last data filed at 06/03/15 0936  Gross per 24 hour  Intake      0 ml  Output      0 ml  Net      0 ml   Filed Weights   06/02/15 1627 06/02/15 2205  Weight: 68.04 kg (150 lb) 66.044 kg (145 lb 9.6 oz)    Exam:   General:  Awake, in nad  Cardiovascular: regular, s1, s2  Respiratory: normal resp effort, no wheezing  Abdomen: soft, nondistended  Musculoskeletal: perfused, no clubbing   Data Reviewed: Basic Metabolic Panel:  Recent Labs Lab 06/02/15 1707 06/03/15 0246  NA 137 136  K 4.1 5.7*  CL 100* 101  CO2 26 27  GLUCOSE 125* 168*  BUN 11 11  CREATININE  0.95 0.89  CALCIUM 8.9 8.2*   Liver Function Tests:  Recent Labs Lab 06/02/15 1707  AST 18  ALT 9*  ALKPHOS 73  BILITOT 0.6  PROT 6.0*  ALBUMIN 2.9*   No results for input(s): LIPASE, AMYLASE in the last 168 hours. No results for input(s): AMMONIA in the last 168 hours. CBC:  Recent Labs Lab 06/02/15 1707 06/03/15 0246  WBC 5.6 4.8  NEUTROABS 4.5  --   HGB 12.4 10.7*  HCT 38.0 34.2*  MCV 93.6 94.0  PLT 175 173   Cardiac Enzymes: No results for input(s): CKTOTAL, CKMB, CKMBINDEX, TROPONINI in the last 168 hours. BNP (last 3 results) No results for input(s): BNP in the last 8760 hours.  ProBNP (last 3 results) No results for input(s): PROBNP in the last 8760 hours.  CBG: No results for input(s): GLUCAP in the last 168  hours.  Recent Results (from the past 240 hour(s))  Urine culture     Status: None (Preliminary result)   Collection Time: 06/02/15  4:50 PM  Result Value Ref Range Status   Specimen Description URINE, CATHETERIZED  Final   Special Requests NONE  Final   Culture TOO YOUNG TO READ  Final   Report Status PENDING  Incomplete  Blood culture (routine x 2)     Status: None (Preliminary result)   Collection Time: 06/02/15  4:52 PM  Result Value Ref Range Status   Specimen Description BLOOD RIGHT ANTECUBITAL  Final   Special Requests BOTTLES DRAWN AEROBIC AND ANAEROBIC 5CC  Final   Culture NO GROWTH < 24 HOURS  Final   Report Status PENDING  Incomplete  Blood culture (routine x 2)     Status: None (Preliminary result)   Collection Time: 06/02/15  4:55 PM  Result Value Ref Range Status   Specimen Description BLOOD RIGHT WRIST  Final   Special Requests BOTTLES DRAWN AEROBIC ONLY 5CC  Final   Culture NO GROWTH < 24 HOURS  Final   Report Status PENDING  Incomplete  MRSA PCR Screening     Status: None   Collection Time: 06/02/15 10:14 PM  Result Value Ref Range Status   MRSA by PCR NEGATIVE NEGATIVE Final    Comment:        The GeneXpert MRSA Assay (FDA approved for NASAL specimens only), is one component of a comprehensive MRSA colonization surveillance program. It is not intended to diagnose MRSA infection nor to guide or monitor treatment for MRSA infections.      Studies: Dg Chest Portable 1 View  06/02/2015  CLINICAL DATA:  Hypoxia. Altered mental status. Urinary tract infection. Dementia. EXAM: PORTABLE CHEST 1 VIEW COMPARISON:  06/06/2013 FINDINGS: The heart size and mediastinal contours are within normal limits allowing for low lung volumes. Both lungs are clear. No pneumothorax or pleural effusion visualized. Mild atherosclerotic calcification of thoracic aorta noted. IMPRESSION: No active disease. Electronically Signed   By: Earle Gell M.D.   On: 06/02/2015 17:09     Scheduled Meds: . atorvastatin  20 mg Oral Daily  . azithromycin  500 mg Intravenous Q24H  . benzonatate  100 mg Oral TID  . cefTRIAXone (ROCEPHIN)  IV  1 g Intravenous Q24H  . cycloSPORINE  1 drop Both Eyes BID  . DULoxetine  60 mg Oral Daily  . gabapentin  200 mg Oral TID  . levalbuterol  0.63 mg Nebulization Q4H   And  . ipratropium  0.5 mg Nebulization Q4H  . ketotifen  1 drop  Both Eyes BID  . lamoTRIgine  150 mg Oral BID  . latanoprost  1 drop Right Eye QHS  . memantine  5 mg Oral BID  . metoprolol tartrate  12.5 mg Oral BID  . mometasone-formoterol  2 puff Inhalation BID  . oseltamivir  30 mg Oral BID  . pantoprazole  40 mg Oral BID AC  . polyethylene glycol  17 g Oral Daily  . risperiDONE  1 mg Oral BID  . rivaroxaban  20 mg Oral Q supper  . sodium chloride flush  3 mL Intravenous Q12H   Continuous Infusions: . sodium chloride 75 mL/hr at 06/03/15 0048    Principal Problem:   Acute encephalopathy Active Problems:   GERD (gastroesophageal reflux disease)   Vascular dementia without behavioral disturbance   A-fib (HCC)   UTI (lower urinary tract infection)   SIRS (systemic inflammatory response syndrome) (HCC)   COPD exacerbation ([place])    Teodora Baumgarten, [place] [facility] Pager [phone]. If 7PM-7AM, please contact night-coverage at [email or web address], password [facility] 06/03/2015, 5:12 PM  LOS: 1 day

## 2015-06-04 DIAGNOSIS — N390 Urinary tract infection, site not specified: Secondary | ICD-10-CM

## 2015-06-04 DIAGNOSIS — J441 Chronic obstructive pulmonary disease with (acute) exacerbation: Secondary | ICD-10-CM

## 2015-06-04 DIAGNOSIS — I482 Chronic atrial fibrillation: Secondary | ICD-10-CM

## 2015-06-04 DIAGNOSIS — F0150 Vascular dementia without behavioral disturbance: Secondary | ICD-10-CM

## 2015-06-04 DIAGNOSIS — G9340 Encephalopathy, unspecified: Secondary | ICD-10-CM

## 2015-06-04 LAB — PROCALCITONIN

## 2015-06-04 LAB — GLUCOSE, CAPILLARY: GLUCOSE-CAPILLARY: 90 mg/dL (ref 65–99)

## 2015-06-04 MED ORDER — ASPIRIN EC 81 MG PO TBEC
81.0000 mg | DELAYED_RELEASE_TABLET | Freq: Every day | ORAL | Status: DC
  Administered 2015-06-04 – 2015-06-06 (×3): 81 mg via ORAL
  Filled 2015-06-04 (×3): qty 1

## 2015-06-04 NOTE — Progress Notes (Signed)
Left heel - dressing changed with no drainage.  Prevalon boot placed as per order.

## 2015-06-04 NOTE — Progress Notes (Signed)
Paged IV team for insertion of new IV.

## 2015-06-04 NOTE — Progress Notes (Signed)
Clarified PTA anticoagulation/antiplatelet agents.  Per [facility], she was taking Xarelto 20mg  qsupper, ASA 81mg  daily, and Aggrenox 200/125mg  daily.  Per note from Dr. Skeet Latch on 01/27/2015, Aggrenox was to have been discontinued and patient should have been on ASA 81mg  + Xarelto 20mg .  Inpatient orders changed to reflect the above recommendations from cardiology. Aggrenox has been removed from the patient's PTA medication despite the fact she was taking it as a safety measure so it is not inadvertently re-ordered on discharge.  Please contact pharmacy with any questions.  Reema Chick D. Lonnie Rosado, PharmD, BCPS Clinical Pharmacist Pager: [phone] 06/04/2015 8:29 AM

## 2015-06-04 NOTE — Progress Notes (Signed)
Patient has taken out peripheral IV for which this is the second occasion that she has done this. Patient also took off oxygen and stated that she is ready to go and that she is leaving here. Encouraged patient to let nurse apply oxygen back on and patient finally agreed. Will continue to monitor patient to end of shift.

## 2015-06-04 NOTE — Plan of Care (Signed)
Problem: Phase I Progression Outcomes Goal: Progress activity as tolerated unless otherwise ordered Outcome: Not Progressing Patient is max to extreme assist  Problem: Phase II Progression Outcomes Goal: ADLs completed with minimal assistance Outcome: Not Applicable Date Met:  56/31/49 Patient is more of a total care patient.

## 2015-06-04 NOTE — Progress Notes (Signed)
Spoke to daughter for she had called late tonight to get an update on her mother. Daughter and son share responsibility of POA. Notified her of the patient's positive flu, urinary tract infection, and blood cultures and updated her that the patient is receiving antibiotics. Daughter would like to speak to the doctor if possible. Will continue to monitor patient to end of shift.  Also daughter was able to explain area on left heel. The daughter explained that the patient had a blister but it is now healing. When heel was assessed, the area looks like a deep tissue injury. No drainage noted. Cleansed area and applied gauzed and secured with kerlex and paper tape. Inserted sticky note to have rounding doctor to place a wound consult to have the wound care nurse to assess. Will continue to monitor patient to end of shift.

## 2015-06-04 NOTE — Consult Note (Signed)
WOC wound consult note Reason for Consult: heel and sacrum Wound type: Left heel: Stage 2 Pressure Injury Did not note in pressure injury on the sacrum; reddened but blanches. Incontinent of urine, most likely related to moisture and exposure to urine Pressure Ulcer POA: Yes Measurement: 3cm x 3cm x 0cm  Wound bed: absorbed serous filled blister, hard, intact skin Drainage (amount, consistency, odor) none Periwound: intact  Dressing procedure/placement/frequency: Add Prevalon boot to offload the left heel.  Barrier cream to prevent skin injury from incontinence.  Discussed POC with patient and bedside nurse.  Re consult if needed, will not follow at this time. Thanks  Shanese Riemenschneider [place], [place] [phone])

## 2015-06-04 NOTE — Progress Notes (Addendum)
[facility] PROGRESS NOTE  Dorothy Collins [id number] DOB: December 23, 1930 DOA: 06/02/2015 PCP: Wenda Low, MD  HPI/Brief narrative 82 year old female with history of dementia, in skilled nursing facility, prior CVA, COPD, GERD, hyperlipidemia, hypertension, arthritis presented to ED from the skilled nursing facility [facility]) with altered mental status, fevers, tachycardia  Assessment/Plan: 1. Influenza A 1. Patient tested flu pos 2. Started tamiflu on 3/21 for total of 5 days tx 3. Continue droplet precautions 2. Acute encephalopathy 1. Suspect toxic-metabolic encephalopathy related to acute illness 2. Refusing being examined, does not want to take meds, refuse iv meds as well, I have called patient's son Dorothy Collins at [phone] he states Dorothy Collins has multiple psychiatric issues in the past, she has history of refusing meds and refusing being examined, he states his sister Dorothy Collins is the first contact in terms of medical treatment, i have also talked to Dorothy Collins, she gives permission to try to help patient take meds, she reports patient likes sweet, may be ice cream will help. 3. Vascular dementia 1. Per daughter , patient has memory issues at baseline, currently does not know she is in the hospital 4. A-fib, chronic,  1. Rate controlled 2. Continue home xarelto for secondary stroke prevention 5. UTI 1. UA with small leuks and pos nitrites in setting of encephalopathy and fevers 2. Pt was started on empiric rocephin  6. COPD exacerbation 1. Con t nebs as tolerated 2. Patient was started empiric azithromycin and rocephin 3. CXR is clear 4. d/c azithro per above, mrsa screening negative, d/c vanc 7. Chronic wounds, left foot, wound care consulted 8. DVT prophylaxis 1. On therapeutic anticoagulation (xarelto)  Code Status: DNR, confirmed with family Family Communication: Pt in room, daughter Dorothy Collins and Dorothy Collins over the phone on 3/22 Disposition Plan: remain in the hospital, eventually  back to snf   Consultants: none  Procedures:  none  Antibiotics: Anti-infectives    Start     Dose/Rate Route Frequency Ordered Stop   06/04/15 0900  vancomycin (VANCOCIN) 500 mg in sodium chloride 0.9 % 100 mL IVPB     500 mg 100 mL/hr over 60 Minutes Intravenous Every 12 hours 06/03/15 2054     06/03/15 1845  vancomycin (VANCOCIN) 1,500 mg in sodium chloride 0.9 % 500 mL IVPB     1,500 mg 250 mL/hr over 120 Minutes Intravenous  Once 06/03/15 1841 06/04/15 0000   06/03/15 1800  cefTRIAXone (ROCEPHIN) 1 g in dextrose 5 % 50 mL IVPB     1 g 100 mL/hr over 30 Minutes Intravenous Every 24 hours 06/02/15 2146     06/03/15 1000  oseltamivir (TAMIFLU) capsule 30 mg     30 mg Oral 2 times daily 06/03/15 0757 06/08/15 0959   06/03/15 1000  oseltamivir (TAMIFLU) capsule 75 mg  Status:  Discontinued     75 mg Oral 2 times daily 06/03/15 0759 06/03/15 0759   06/02/15 2300  azithromycin (ZITHROMAX) 500 mg in dextrose 5 % 250 mL IVPB  Status:  Discontinued     500 mg 250 mL/hr over 60 Minutes Intravenous Every 24 hours 06/02/15 2146 06/03/15 1712   06/02/15 1745  cefTRIAXone (ROCEPHIN) 1 g in dextrose 5 % 50 mL IVPB     1 g 100 mL/hr over 30 Minutes Intravenous  Once 06/02/15 1736 06/02/15 1827      HPI/Subjective: Refuse take meds, refuse being examined, does not know she is in the hospital.  Objective: Filed Vitals:   06/04/15 0618 06/04/15 0700  06/04/15 0841 06/04/15 0843  BP:  118/54    Pulse:  77    Temp:  98 F (36.7 C)    TempSrc:  Oral    Resp:  18    Height:      Weight: 67.495 kg (148 lb 12.8 oz)     SpO2:  96% 96% 96%    Intake/Output Summary (Last 24 hours) at 06/04/15 1005 Last data filed at 06/04/15 0900  Gross per 24 hour  Intake   2010 ml  Output      0 ml  Net   2010 ml   Filed Weights   06/02/15 1627 06/02/15 2205 06/04/15 0618  Weight: 68.04 kg (150 lb) 66.044 kg (145 lb 9.6 oz) 67.495 kg (148 lb 12.8 oz)    Exam:   General:  Awake, in  nad  Cardiovascular: IRRR, in chronic afib  Respiratory: normal resp effort, no wheezing  Abdomen: soft, nondistended  Musculoskeletal: perfused, no clubbing , left foot chronic ulcer  Data Reviewed: Basic Metabolic Panel:  Recent Labs Lab 06/02/15 1707 06/03/15 0246  NA 137 136  K 4.1 5.7*  CL 100* 101  CO2 26 27  GLUCOSE 125* 168*  BUN 11 11  CREATININE 0.95 0.89  CALCIUM 8.9 8.2*   Liver Function Tests:  Recent Labs Lab 06/02/15 1707  AST 18  ALT 9*  ALKPHOS 73  BILITOT 0.6  PROT 6.0*  ALBUMIN 2.9*   No results for input(s): LIPASE, AMYLASE in the last 168 hours. No results for input(s): AMMONIA in the last 168 hours. CBC:  Recent Labs Lab 06/02/15 1707 06/03/15 0246  WBC 5.6 4.8  NEUTROABS 4.5  --   HGB 12.4 10.7*  HCT 38.0 34.2*  MCV 93.6 94.0  PLT 175 173   Cardiac Enzymes: No results for input(s): CKTOTAL, CKMB, CKMBINDEX, TROPONINI in the last 168 hours. BNP (last 3 results) No results for input(s): BNP in the last 8760 hours.  ProBNP (last 3 results) No results for input(s): PROBNP in the last 8760 hours.  CBG: No results for input(s): GLUCAP in the last 168 hours.  Recent Results (from the past 240 hour(s))  Urine culture     Status: None (Preliminary result)   Collection Time: 06/02/15  4:50 PM  Result Value Ref Range Status   Specimen Description URINE, CATHETERIZED  Final   Special Requests NONE  Final   Culture TOO YOUNG TO READ  Final   Report Status PENDING  Incomplete  Blood culture (routine x 2)     Status: None (Preliminary result)   Collection Time: 06/02/15  4:52 PM  Result Value Ref Range Status   Specimen Description BLOOD RIGHT ANTECUBITAL  Final   Special Requests BOTTLES DRAWN AEROBIC AND ANAEROBIC 5CC  Final   Culture  Setup Time   Final    GRAM POSITIVE COCCI IN CLUSTERS AEROBIC BOTTLE ONLY CRITICAL RESULT CALLED TO, READ BACK BY AND VERIFIED WITH: Thom Chimes RN 1830 06/03/15 A BROWNING    Culture NO GROWTH <  24 HOURS  Final   Report Status PENDING  Incomplete  Blood culture (routine x 2)     Status: None (Preliminary result)   Collection Time: 06/02/15  4:55 PM  Result Value Ref Range Status   Specimen Description BLOOD RIGHT WRIST  Final   Special Requests BOTTLES DRAWN AEROBIC ONLY 5CC  Final   Culture NO GROWTH < 24 HOURS  Final   Report Status PENDING  Incomplete  MRSA PCR  Screening     Status: None   Collection Time: 06/02/15 10:14 PM  Result Value Ref Range Status   MRSA by PCR NEGATIVE NEGATIVE Final    Comment:        The GeneXpert MRSA Assay (FDA approved for NASAL specimens only), is one component of a comprehensive MRSA colonization surveillance program. It is not intended to diagnose MRSA infection nor to guide or monitor treatment for MRSA infections.      Studies: Dg Chest Portable 1 View  06/02/2015  CLINICAL DATA:  Hypoxia. Altered mental status. Urinary tract infection. Dementia. EXAM: PORTABLE CHEST 1 VIEW COMPARISON:  06/06/2013 FINDINGS: The heart size and mediastinal contours are within normal limits allowing for low lung volumes. Both lungs are clear. No pneumothorax or pleural effusion visualized. Mild atherosclerotic calcification of thoracic aorta noted. IMPRESSION: No active disease. Electronically Signed   By: Earle Gell M.D.   On: 06/02/2015 17:09    Scheduled Meds: . aspirin EC  81 mg Oral Daily  . atorvastatin  20 mg Oral Daily  . benzonatate  100 mg Oral TID  . cefTRIAXone (ROCEPHIN)  IV  1 g Intravenous Q24H  . cycloSPORINE  1 drop Both Eyes BID  . DULoxetine  60 mg Oral Daily  . gabapentin  200 mg Oral TID  . levalbuterol  0.63 mg Nebulization Q4H   And  . ipratropium  0.5 mg Nebulization Q4H  . ketotifen  1 drop Both Eyes BID  . lamoTRIgine  150 mg Oral BID  . latanoprost  1 drop Right Eye QHS  . memantine  5 mg Oral BID  . metoprolol tartrate  12.5 mg Oral BID  . mometasone-formoterol  2 puff Inhalation BID  . oseltamivir  30 mg Oral BID   . pantoprazole  40 mg Oral BID AC  . polyethylene glycol  17 g Oral Daily  . risperiDONE  1 mg Oral BID  . rivaroxaban  20 mg Oral Q supper  . sodium chloride flush  3 mL Intravenous Q12H  . vancomycin  500 mg Intravenous Q12H   Continuous Infusions: . sodium chloride 75 mL/hr at 06/03/15 0048    Principal Problem:   Acute encephalopathy Active Problems:   GERD (gastroesophageal reflux disease)   Vascular dementia without behavioral disturbance   A-fib (HCC)   UTI (lower urinary tract infection)   SIRS (systemic inflammatory response syndrome) (HCC)   COPD exacerbation ([place])  Time spent> 26mins  Makilah Dowda MD PhD  [facility] Pager [phone]. If 7PM-7AM, please contact night-coverage at [email or web address], password [facility] 06/04/2015, 10:05 AM  LOS: 2 days

## 2015-06-05 DIAGNOSIS — L8990 Pressure ulcer of unspecified site, unspecified stage: Secondary | ICD-10-CM | POA: Diagnosis present

## 2015-06-05 LAB — BASIC METABOLIC PANEL
ANION GAP: 12 (ref 5–15)
BUN: 10 mg/dL (ref 6–20)
CALCIUM: 8.4 mg/dL — AB (ref 8.9–10.3)
CO2: 25 mmol/L (ref 22–32)
Chloride: 103 mmol/L (ref 101–111)
Creatinine, Ser: 0.57 mg/dL (ref 0.44–1.00)
Glucose, Bld: 90 mg/dL (ref 65–99)
Potassium: 4.2 mmol/L (ref 3.5–5.1)
SODIUM: 140 mmol/L (ref 135–145)

## 2015-06-05 LAB — CBC
HEMATOCRIT: 39.4 % (ref 36.0–46.0)
HEMOGLOBIN: 11.5 g/dL — AB (ref 12.0–15.0)
MCH: 26.6 pg (ref 26.0–34.0)
MCHC: 29.2 g/dL — ABNORMAL LOW (ref 30.0–36.0)
MCV: 91.2 fL (ref 78.0–100.0)
Platelets: 126 10*3/uL — ABNORMAL LOW (ref 150–400)
RBC: 4.32 MIL/uL (ref 3.87–5.11)
RDW: 14.1 % (ref 11.5–15.5)
WBC: 3.6 10*3/uL — ABNORMAL LOW (ref 4.0–10.5)

## 2015-06-05 LAB — GLUCOSE, CAPILLARY
GLUCOSE-CAPILLARY: 136 mg/dL — AB (ref 65–99)
GLUCOSE-CAPILLARY: 91 mg/dL (ref 65–99)
GLUCOSE-CAPILLARY: 114 mg/dL — AB (ref 65–99)

## 2015-06-05 LAB — URINE CULTURE

## 2015-06-05 LAB — CULTURE, BLOOD (ROUTINE X 2)

## 2015-06-05 MED ORDER — IPRATROPIUM BROMIDE 0.02 % IN SOLN
0.5000 mg | Freq: Three times a day (TID) | RESPIRATORY_TRACT | Status: DC
  Administered 2015-06-06 (×2): 0.5 mg via RESPIRATORY_TRACT
  Filled 2015-06-05 (×2): qty 2.5

## 2015-06-05 MED ORDER — LEVALBUTEROL HCL 0.63 MG/3ML IN NEBU
0.6300 mg | INHALATION_SOLUTION | Freq: Three times a day (TID) | RESPIRATORY_TRACT | Status: DC
  Administered 2015-06-06 (×2): 0.63 mg via RESPIRATORY_TRACT
  Filled 2015-06-05 (×2): qty 3

## 2015-06-05 NOTE — Progress Notes (Signed)
Patient has is extremely incontinent to where the patient and their bedding has been changed about 3-4 times during the night. Also during the night, patient has refused her telemetry box and after several times of encouraging her to wear it, patient finally lets nurse apply the leads to monitor her heart. Patient has been more irritable than usual but began to be somewhat more pleasant this morning. Nurse signing off and oncoming nurse will care for patient at this time.

## 2015-06-05 NOTE — Progress Notes (Signed)
[facility] PROGRESS NOTE  Dorothy Collins [id number] DOB: Mar 09, 1931 DOA: 06/02/2015 PCP: Wenda Low, MD  HPI/Brief narrative 82 year old female with history of dementia, in skilled nursing facility, prior CVA, COPD, GERD, hyperlipidemia, hypertension, arthritis presented to ED from the skilled nursing facility [facility]) with altered mental status, fevers, tachycardia  Assessment/Plan: 1. Influenza A 1. Patient tested flu pos 2. Started tamiflu on 3/21 for total of 5 days tx 3. Continue droplet precautions, wean oxygen 2. Acute encephalopathy with baseline dementia 1. Suspect toxic-metabolic encephalopathy related to acute illness 2. Refusing being examined, does not want to take meds, refuse iv meds as well, I have called patient's son Shanon Brow at [phone] he states [name] has multiple psychiatric issues in the past, she has history of refusing meds and refusing being examined, he states his sister Jeani Hawking is the first contact in terms of medical treatment, i have also talked to lynn, she gives permission to try to help patient take meds, she reports patient likes sweet, may be ice cream will help. 3. Better, more cooperative on 3/23 3. Vascular dementia 1. Per daughter , patient has memory issues at baseline, currently does not know she is in the hospital 4. A-fib, chronic,  1. Rate controlled on metoprolol tartrate 2. Continue home xarelto for secondary stroke prevention 5. UTI ? 1. UA with small leuks and pos nitrites in setting of encephalopathy and fevers 2. Pt was started on empiric rocephin , urine culture with multiple species, blood culture 1/2 with coag negative staph, likely contamination, will continue rocephin for another 24hrs to cover possible uti and also cover for possible bronchitis 6. COPD exacerbation 1. Con t nebs as tolerated 2. Patient was started empiric azithromycin and rocephin 3. CXR is clear 4. d/c azithro per above, mrsa screening negative, d/c  vanc, continue rocephin for another 24hrs 7. Chronic wounds, left foot, wound care consulted, Prevalon boot applied  8. DVT prophylaxis 1. On therapeutic anticoagulation (xarelto)  Code Status: DNR, confirmed with family Family Communication: Pt in room, daughter Jeani Hawking and david over the phone on 3/22 Disposition Plan:  back to snf likely on 3/24   Consultants: none  Procedures:  none  Antibiotics: Anti-infectives    Start     Dose/Rate Route Frequency Ordered Stop   06/04/15 0900  vancomycin (VANCOCIN) 500 mg in sodium chloride 0.9 % 100 mL IVPB  Status:  Discontinued     500 mg 100 mL/hr over 60 Minutes Intravenous Every 12 hours 06/03/15 2054 06/04/15 1023   06/03/15 1845  vancomycin (VANCOCIN) 1,500 mg in sodium chloride 0.9 % 500 mL IVPB     1,500 mg 250 mL/hr over 120 Minutes Intravenous  Once 06/03/15 1841 06/04/15 0000   06/03/15 1800  cefTRIAXone (ROCEPHIN) 1 g in dextrose 5 % 50 mL IVPB     1 g 100 mL/hr over 30 Minutes Intravenous Every 24 hours 06/02/15 2146     06/03/15 1000  oseltamivir (TAMIFLU) capsule 30 mg     30 mg Oral 2 times daily 06/03/15 0757 06/08/15 0959   06/03/15 1000  oseltamivir (TAMIFLU) capsule 75 mg  Status:  Discontinued     75 mg Oral 2 times daily 06/03/15 0759 06/03/15 0759   06/02/15 2300  azithromycin (ZITHROMAX) 500 mg in dextrose 5 % 250 mL IVPB  Status:  Discontinued     500 mg 250 mL/hr over 60 Minutes Intravenous Every 24 hours 06/02/15 2146 06/03/15 1712   06/02/15 1745  cefTRIAXone (ROCEPHIN)  1 g in dextrose 5 % 50 mL IVPB     1 g 100 mL/hr over 30 Minutes Intravenous  Once 06/02/15 1736 06/02/15 1827      HPI/Subjective: More cooperative today, allowed to be examined, though history not reliable.  Objective: Filed Vitals:   06/04/15 2259 06/05/15 0652 06/05/15 1129 06/05/15 1240  BP: 145/91 139/69 127/73   Pulse: 118 97 94   Temp: 98.9 F (37.2 C) 99.4 F (37.4 C) 98.5 F (36.9 C)   TempSrc: Axillary Oral Oral    Resp: 18 16 16    Height:      Weight:  67.482 kg (148 lb 12.3 oz)    SpO2: 96% 98% 96% 94%    Intake/Output Summary (Last 24 hours) at 06/05/15 1331 Last data filed at 06/05/15 1101  Gross per 24 hour  Intake 2602.5 ml  Output      0 ml  Net 2602.5 ml   Filed Weights   06/02/15 2205 06/04/15 0618 06/05/15 0652  Weight: 66.044 kg (145 lb 9.6 oz) 67.495 kg (148 lb 12.8 oz) 67.482 kg (148 lb 12.3 oz)    Exam:   General:  Awake, in nad  Cardiovascular: IRRR, in chronic afib  Respiratory: normal resp effort, no wheezing  Abdomen: soft, nondistended  Musculoskeletal: perfused, no clubbing , left foot chronic ulcer, now on prevalon boot  Data Reviewed: Basic Metabolic Panel:  Recent Labs Lab 06/02/15 1707 06/03/15 0246 06/05/15 0545  NA 137 136 140  K 4.1 5.7* 4.2  CL 100* 101 103  CO2 26 27 25   GLUCOSE 125* 168* 90  BUN 11 11 10   CREATININE 0.95 0.89 0.57  CALCIUM 8.9 8.2* 8.4*   Liver Function Tests:  Recent Labs Lab 06/02/15 1707  AST 18  ALT 9*  ALKPHOS 73  BILITOT 0.6  PROT 6.0*  ALBUMIN 2.9*   No results for input(s): LIPASE, AMYLASE in the last 168 hours. No results for input(s): AMMONIA in the last 168 hours. CBC:  Recent Labs Lab 06/02/15 1707 06/03/15 0246 06/05/15 0545  WBC 5.6 4.8 3.6*  NEUTROABS 4.5  --   --   HGB 12.4 10.7* 11.5*  HCT 38.0 34.2* 39.4  MCV 93.6 94.0 91.2  PLT 175 173 126*   Cardiac Enzymes: No results for input(s): CKTOTAL, CKMB, CKMBINDEX, TROPONINI in the last 168 hours. BNP (last 3 results) No results for input(s): BNP in the last 8760 hours.  ProBNP (last 3 results) No results for input(s): PROBNP in the last 8760 hours.  CBG:  Recent Labs Lab 06/04/15 2202 06/05/15 1131  GLUCAP 90 136*    Recent Results (from the past 240 hour(s))  Urine culture     Status: None   Collection Time: 06/02/15  4:50 PM  Result Value Ref Range Status   Specimen Description URINE, CATHETERIZED  Final   Special  Requests NONE  Final   Culture MULTIPLE SPECIES PRESENT, SUGGEST RECOLLECTION  Final   Report Status 06/05/2015 FINAL  Final  Blood culture (routine x 2)     Status: None   Collection Time: 06/02/15  4:52 PM  Result Value Ref Range Status   Specimen Description BLOOD RIGHT ANTECUBITAL  Final   Special Requests BOTTLES DRAWN AEROBIC AND ANAEROBIC 5CC  Final   Culture  Setup Time   Final    GRAM POSITIVE COCCI IN CLUSTERS AEROBIC BOTTLE ONLY CRITICAL RESULT CALLED TO, READ BACK BY AND VERIFIED WITH: Thom Chimes RN 1830 06/03/15 A BROWNING  Culture   Final    STAPHYLOCOCCUS SPECIES (COAGULASE NEGATIVE) THE SIGNIFICANCE OF ISOLATING THIS ORGANISM FROM A SINGLE SET OF BLOOD CULTURES WHEN MULTIPLE SETS ARE DRAWN IS UNCERTAIN. PLEASE NOTIFY THE MICROBIOLOGY DEPARTMENT WITHIN ONE WEEK IF SPECIATION AND SENSITIVITIES ARE REQUIRED.    Report Status 06/05/2015 FINAL  Final  Blood culture (routine x 2)     Status: None (Preliminary result)   Collection Time: 06/02/15  4:55 PM  Result Value Ref Range Status   Specimen Description BLOOD RIGHT WRIST  Final   Special Requests BOTTLES DRAWN AEROBIC ONLY 5CC  Final   Culture NO GROWTH 3 DAYS  Final   Report Status PENDING  Incomplete  MRSA PCR Screening     Status: None   Collection Time: 06/02/15 10:14 PM  Result Value Ref Range Status   MRSA by PCR NEGATIVE NEGATIVE Final    Comment:        The GeneXpert MRSA Assay (FDA approved for NASAL specimens only), is one component of a comprehensive MRSA colonization surveillance program. It is not intended to diagnose MRSA infection nor to guide or monitor treatment for MRSA infections.      Studies: No results found.  Scheduled Meds: . aspirin EC  81 mg Oral Daily  . atorvastatin  20 mg Oral Daily  . benzonatate  100 mg Oral TID  . cefTRIAXone (ROCEPHIN)  IV  1 g Intravenous Q24H  . cycloSPORINE  1 drop Both Eyes BID  . DULoxetine  60 mg Oral Daily  . gabapentin  200 mg Oral TID  .  levalbuterol  0.63 mg Nebulization Q4H   And  . ipratropium  0.5 mg Nebulization Q4H  . ketotifen  1 drop Both Eyes BID  . lamoTRIgine  150 mg Oral BID  . latanoprost  1 drop Right Eye QHS  . memantine  5 mg Oral BID  . metoprolol tartrate  12.5 mg Oral BID  . mometasone-formoterol  2 puff Inhalation BID  . oseltamivir  30 mg Oral BID  . pantoprazole  40 mg Oral BID AC  . polyethylene glycol  17 g Oral Daily  . risperiDONE  1 mg Oral BID  . rivaroxaban  20 mg Oral Q supper  . sodium chloride flush  3 mL Intravenous Q12H   Continuous Infusions:    Principal Problem:   Acute encephalopathy Active Problems:   GERD (gastroesophageal reflux disease)   Vascular dementia without behavioral disturbance   A-fib (HCC)   UTI (lower urinary tract infection)   SIRS (systemic inflammatory response syndrome) (HCC)   COPD exacerbation (HCC)   Pressure ulcer  Time spent 70mins  Bliss Behnke MD PhD  [facility] Pager [phone]. If 7PM-7AM, please contact night-coverage at [email or web address], password [facility] 06/05/2015, 1:31 PM  LOS: 3 days

## 2015-06-06 DIAGNOSIS — J111 Influenza due to unidentified influenza virus with other respiratory manifestations: Secondary | ICD-10-CM

## 2015-06-06 LAB — BASIC METABOLIC PANEL
Anion gap: 8 (ref 5–15)
BUN: 6 mg/dL (ref 6–20)
CO2: 28 mmol/L (ref 22–32)
CREATININE: 0.61 mg/dL (ref 0.44–1.00)
Calcium: 8.3 mg/dL — ABNORMAL LOW (ref 8.9–10.3)
Chloride: 104 mmol/L (ref 101–111)
Glucose, Bld: 94 mg/dL (ref 65–99)
Potassium: 3.2 mmol/L — ABNORMAL LOW (ref 3.5–5.1)
SODIUM: 140 mmol/L (ref 135–145)

## 2015-06-06 LAB — CBC
HCT: 32.6 % — ABNORMAL LOW (ref 36.0–46.0)
HEMOGLOBIN: 10.3 g/dL — AB (ref 12.0–15.0)
MCH: 29.3 pg (ref 26.0–34.0)
MCHC: 31.6 g/dL (ref 30.0–36.0)
MCV: 92.6 fL (ref 78.0–100.0)
PLATELETS: 146 10*3/uL — AB (ref 150–400)
RBC: 3.52 MIL/uL — AB (ref 3.87–5.11)
RDW: 14.1 % (ref 11.5–15.5)
WBC: 3.7 10*3/uL — ABNORMAL LOW (ref 4.0–10.5)

## 2015-06-06 LAB — PROCALCITONIN

## 2015-06-06 LAB — GLUCOSE, CAPILLARY: GLUCOSE-CAPILLARY: 177 mg/dL — AB (ref 65–99)

## 2015-06-06 MED ORDER — TIOTROPIUM BROMIDE MONOHYDRATE 18 MCG IN CAPS: 18.0000 ug | ORAL_CAPSULE | Freq: Every morning | RESPIRATORY_TRACT | Status: AC

## 2015-06-06 MED ORDER — DOXYCYCLINE HYCLATE 100 MG PO CAPS: 100.0000 mg | ORAL_CAPSULE | Freq: Two times a day (BID) | ORAL | Status: AC

## 2015-06-06 MED ORDER — BUDESONIDE-FORMOTEROL FUMARATE 80-4.5 MCG/ACT IN AERO: 2.0000 | INHALATION_SPRAY | Freq: Two times a day (BID) | RESPIRATORY_TRACT | Status: AC

## 2015-06-06 MED ORDER — OSELTAMIVIR PHOSPHATE 30 MG PO CAPS: 30.0000 mg | ORAL_CAPSULE | Freq: Two times a day (BID) | ORAL | Status: AC

## 2015-06-06 NOTE — Progress Notes (Signed)
Patient will DC to: [facility] Anticipated DC date: 06/06/15 Family notified: Son Transport by: PTAR 3pm  CSW signing off.  Cedric Fishman, [place] Social Worker [phone]

## 2015-06-06 NOTE — Clinical Social Work Note (Signed)
Clinical Social Work Assessment  Patient Details  Name: Dorothy Collins MRN: [id number] Date of Birth: [birth year]  Date of referral:  06/06/15               Reason for consult:  Facility Placement                Permission sought to share information with:  Facility Sport and exercise psychologist, Family Supports Permission granted to share information::  No (Patient is disoriented)  Name::     Dorothy Collins  Agency::  [employer]  Relationship::  Son  Contact Information:  [phone]  Housing/Transportation Living arrangements for the past 2 months:  Dorothy Collins of Information:  Adult Children Patient Interpreter Needed:  None Criminal Activity/Legal Involvement Pertinent to Current Situation/Hospitalization:  No - Comment as needed Significant Relationships:  Adult Children Lives with:  Self Do you feel safe going back to the place where you live?  Yes Need for family participation in patient care:  Yes (Comment)  Care giving concerns:  CSW received referral for return to SNF. Patient is disoriented. CSW spoke with patient's daughter regarding patient's return to [facility]. Patient's daughter is in agreement with plan.   Social Worker assessment / plan:  CSW spoke with patient's daughter concerning return to SNF. Employment status:  Retired Forensic scientist:  Medicare PT Recommendations:  [place] / Referral to community resources:  [place]  Patient/Family's Response to care:  Patient's daughter expresses agreement with discharge plan. Patient will return by [facility].  Patient/Family's Understanding of and Emotional Response to Diagnosis, Current Treatment, and Prognosis:  No questions/concerns.  Emotional Assessment Appearance:  Appears stated age Attitude/Demeanor/Rapport:  Unable to Assess Affect (typically observed):  Unable to Assess Orientation:  Oriented to Self Alcohol / Substance use:  Not Applicable Psych  involvement (Current and /or in the community):  No (Comment)  Discharge Needs  Concerns to be addressed:  Care Coordination Readmission within the last 30 days:  No Current discharge risk:  None Barriers to Discharge:  Continued Medical Work up   [employer], [place] 06/06/2015, 8:45 AM

## 2015-06-06 NOTE — Discharge Summary (Addendum)
Discharge Summary  Dorothy Collins [id number] DOB: Feb 25, 1931  PCP: Dorothy Low, MD  Admit date: 06/02/2015 Discharge date: 06/06/2015  Time spent: >16mins  Recommendations for Outpatient Follow-up:  1. F/u with PMD at [facility] for hospital discharge follow up, repeat cbc/bmp at follow up 2. New meds: doxycycline for 5 days, tamiflu for two more days, symbicort, spiriva  Discharge Diagnoses:  Active Hospital Problems   Diagnosis Date Noted  . Acute encephalopathy 06/02/2015  . Pressure ulcer 06/05/2015  . UTI (lower urinary tract infection) 06/02/2015  . SIRS (systemic inflammatory response syndrome) ([place]) 06/02/2015  . COPD exacerbation ([place]) 06/02/2015  . A-fib ([place]) 09/07/2013  . Vascular dementia without behavioral disturbance 10/02/2012  . GERD (gastroesophageal reflux disease) 10/02/2012    Resolved Hospital Problems   Diagnosis Date Noted Date Resolved  No resolved problems to display.    Discharge Condition: stable  Diet recommendation: heart healthy  Filed Weights   06/04/15 0618 06/05/15 0652 06/06/15 0415  Weight: 67.495 kg (148 lb 12.8 oz) 67.482 kg (148 lb 12.3 oz) 65.726 kg (144 lb 14.4 oz)    History of present illness:  Patient is a 82 year old female with history of dementia, in skilled nursing facility, prior CVA, COPD, GERD, hyperlipidemia, hypertension, arthritis presented to ED from the skilled nursing facility [facility]) with altered mental status. Patient is unable to provide any history and no family member is present at the bedside. Patient was sent to the ED for evaluation as she was not acting like herself. She has prior history of UTI and has similar presentation per family who reported to [place]. During my encounter, patient is oriented to self and does not know why she is in the hospital. She also is having coughing and wheezing. In ED, patient was febrile temperature and 1.5, pulse 102, respiratory rate 15, BP 106/73, O2 sats 100% on 3 L. Nasal  cannula BMET unremarkable except albumin 2.9 lactic acid 1.1 CBC unremarkable Chest x-ray showed no active disease, UA positive for urinary tract infection.  Hospital Course:  Principal Problem:   Acute encephalopathy Active Problems:   GERD (gastroesophageal reflux disease)   Vascular dementia without behavioral disturbance   A-fib (HCC)   UTI (lower urinary tract infection)   SIRS (systemic inflammatory response syndrome) (HCC)   COPD exacerbation (HCC)   Pressure ulcer   Influenza A with sepsis presented on admission with fever 101, sinus tachycardia, tachypneic  Patient tested flu pos  Started tamiflu on 3/21 for total of 5 days tx  Continue droplet precautions, wean oxygen  Acute encephalopathy with baseline dementia  Suspect toxic-metabolic encephalopathy related to acute illness  Refusing being examined, does not want to take meds, refuse iv meds as well, I have called patient's son Dorothy Collins at [phone] he states [name] has multiple psychiatric issues in the past, she has history of refusing meds and refusing being examined, he states his sister Dorothy Collins is the first contact in terms of medical treatment, i have also talked to lynn, she gives permission to try to help patient take meds, she reports patient likes sweet, may be ice cream will help.  Better, more cooperative and pleasant on 3/24  Vascular dementia  Per daughter , patient has memory issues at baseline, currently does not know she is in the hospital  A-fib, chronic,   Rate controlled on metoprolol tartrate  Continue home xarelto for secondary stroke prevention  UTI ?  UA with small leuks and pos nitrites in setting of encephalopathy  and fevers  Pt was started on empiric rocephin , urine culture with multiple species, blood culture 1/2 with coag negative staph, likely contamination, will continue rocephin for another 24hrs to cover possible uti and also cover for possible bronchitis  COPD  exacerbation  Con t nebs as tolerated  Patient was started empiric azithromycin and rocephin  CXR is clear  d/c azithro per above, mrsa screening negative, d/c vanc, continue rocephin for another 24hrs  Patient is discharged on symbicort/spiriva and doxycycline for 81more days.  Chronic wounds, left foot, wound care consulted, Prevalon boot applied   DVT prophylaxis  On therapeutic anticoagulation (xarelto)  Code Status: DNR, confirmed with family Family Communication: Pt in room, daughter Dorothy Collins and Dorothy Collins over the phone on 3/22 Disposition Plan: back to snf  on 3/24   Consultants: none  Procedures:  none   Discharge Exam: BP 134/86 mmHg  Pulse 113  Temp(Src) 98.1 F (36.7 C) (Oral)  Resp 16  Ht 5\' 2"  (1.575 m)  Wt 65.726 kg (144 lb 14.4 oz)  BMI 26.50 kg/m2  SpO2 96%   General: Awake, in nad  Cardiovascular: IRRR, in chronic afib  Respiratory: normal resp effort, no wheezing  Abdomen: soft, nondistended  Musculoskeletal: perfused, no clubbing , left foot chronic ulcer, now on prevalon boot   Discharge Instructions You were cared for by a hospitalist during your hospital stay. If you have any questions about your discharge medications or the care you received while you were in the hospital after you are discharged, you can call the unit and asked to speak with the hospitalist on call if the hospitalist that took care of you is not available. Once you are discharged, your primary care physician will handle any further medical issues. Please note that NO REFILLS for any discharge medications will be authorized once you are discharged, as it is imperative that you return to your primary care physician (or establish a relationship with a primary care physician if you do not have one) for your aftercare needs so that they can reassess your need for medications and monitor your lab values.  Discharge Instructions    Diet - Collins sodium heart healthy    Complete by:   As directed      Increase activity slowly    Complete by:  As directed             Medication List    STOP taking these medications        ondansetron 4 MG tablet  Commonly known as:  ZOFRAN     oxycodone 5 MG capsule  Commonly known as:  OXY-IR     oxyCODONE 5 MG immediate release tablet  Commonly known as:  Oxy IR/ROXICODONE     SIMBRINZA 1-0.2 % Susp  Generic drug:  Brinzolamide-Brimonidine      TAKE these medications        aspirin EC 81 MG tablet  Take 81 mg by mouth daily.     atorvastatin 20 MG tablet  Commonly known as:  LIPITOR  Take 20 mg by mouth daily.     bimatoprost 0.01 % Soln  Commonly known as:  LUMIGAN  Place 1 drop into the right eye at bedtime.     budesonide-formoterol 80-4.5 MCG/ACT inhaler  Commonly known as:  SYMBICORT  Inhale 2 puffs into the lungs 2 (two) times daily.     calcium-vitamin D 500-200 MG-UNIT tablet  Commonly known as:  OSCAL WITH D  Take 1 tablet by  mouth 2 (two) times daily.     Cholecalciferol 2000 units Caps  Take 2,000 Units by mouth daily.     clonazePAM 0.5 MG tablet  Commonly known as:  KLONOPIN  Take 1 tablet (0.5 mg total) by mouth 3 (three) times daily as needed for anxiety.     cycloSPORINE 0.05 % ophthalmic emulsion  Commonly known as:  RESTASIS  Place 1 drop into both eyes 2 (two) times daily.     doxycycline 100 MG capsule  Commonly known as:  VIBRAMYCIN  Take 1 capsule (100 mg total) by mouth 2 (two) times daily.     DULoxetine 30 MG capsule  Commonly known as:  CYMBALTA  Take 2 capsules (60 mg total) by mouth daily. Takes with 60 mg for total of 90mg      fexofenadine 180 MG tablet  Commonly known as:  ALLEGRA  Take 180 mg by mouth daily.     gabapentin 400 MG capsule  Commonly known as:  NEURONTIN  Take 1 capsule (400 mg total) by mouth 3 (three) times daily.     ketotifen 0.025 % ophthalmic solution  Commonly known as:  ZADITOR  Place 1 drop into both eyes 2 (two) times daily. Wait 3-5  minutes between eye meds     lamoTRIgine 150 MG tablet  Commonly known as:  LAMICTAL  Take 150 mg by mouth 2 (two) times daily.     lidocaine 5 %  Commonly known as:  LIDODERM  Place 1 patch onto the skin daily. Apply to back  -  Remove & Discard patch within 12 hours or as directed by MD. On for 12 hours off for 12 hours     meclizine 25 MG tablet  Commonly known as:  ANTIVERT  Take 25 mg by mouth every 6 (six) hours as needed for dizziness.     memantine 5 MG tablet  Commonly known as:  NAMENDA  Take 5 mg by mouth 2 (two) times daily.     metoprolol tartrate 25 MG tablet  Commonly known as:  LOPRESSOR  Take 12.5 mg by mouth 2 (two) times daily.     omega-3 acid ethyl esters 1 g capsule  Commonly known as:  LOVAZA  Take 2 g by mouth 2 (two) times daily.     omeprazole 20 MG capsule  Commonly known as:  PRILOSEC  Take 40 mg by mouth 2 (two) times daily before a meal.     oseltamivir 30 MG capsule  Commonly known as:  TAMIFLU  Take 1 capsule (30 mg total) by mouth 2 (two) times daily.     polyethylene glycol packet  Commonly known as:  MIRALAX / GLYCOLAX  Take 17 g by mouth daily.     risperiDONE 1 MG disintegrating tablet  Commonly known as:  RISPERDAL M-TABS  Take 1 mg by mouth 2 (two) times daily.     rivaroxaban 20 MG Tabs tablet  Commonly known as:  XARELTO  Take 1 tablet (20 mg total) by mouth daily with supper.     senna 8.6 MG tablet  Commonly known as:  SENOKOT  Take 1 tablet by mouth 2 (two) times daily.     SYSTANE OP  Place 1 drop into both eyes 4 (four) times daily.     tiotropium 18 MCG inhalation capsule  Commonly known as:  SPIRIVA HANDIHALER  Place 1 capsule (18 mcg total) into inhaler and inhale every morning.     traZODone 50 MG tablet  Commonly known as:  DESYREL  Take 50 mg by mouth at bedtime.     ZOLMitriptan 2.5 MG tablet  Commonly known as:  ZOMIG  Take 2.5 mg by mouth as needed. For migraine       Allergies  Allergen  Reactions  . Lyrica [Pregabalin]     unk  . Morphine And Related     unk  . Penicillins     unk  . Sulfa Antibiotics     unk      The results of significant diagnostics from this hospitalization (including imaging, microbiology, ancillary and laboratory) are listed below for reference.    Significant Diagnostic Studies: Dg Chest Portable 1 View  06/02/2015  CLINICAL DATA:  Hypoxia. Altered mental status. Urinary tract infection. Dementia. EXAM: PORTABLE CHEST 1 VIEW COMPARISON:  06/06/2013 FINDINGS: The heart size and mediastinal contours are within normal limits allowing for Collins lung volumes. Both lungs are clear. No pneumothorax or pleural effusion visualized. Mild atherosclerotic calcification of thoracic aorta noted. IMPRESSION: No active disease. Electronically Signed   By: Earle Gell M.D.   On: 06/02/2015 17:09    Microbiology: Recent Results (from the past 240 hour(s))  Urine culture     Status: None   Collection Time: 06/02/15  4:50 PM  Result Value Ref Range Status   Specimen Description URINE, CATHETERIZED  Final   Special Requests NONE  Final   Culture MULTIPLE SPECIES PRESENT, SUGGEST RECOLLECTION  Final   Report Status 06/05/2015 FINAL  Final  Blood culture (routine x 2)     Status: None   Collection Time: 06/02/15  4:52 PM  Result Value Ref Range Status   Specimen Description BLOOD RIGHT ANTECUBITAL  Final   Special Requests BOTTLES DRAWN AEROBIC AND ANAEROBIC 5CC  Final   Culture  Setup Time   Final    GRAM POSITIVE COCCI IN CLUSTERS AEROBIC BOTTLE ONLY CRITICAL RESULT CALLED TO, READ BACK BY AND VERIFIED WITH: T FAIRING RN 1830 06/03/15 A BROWNING    Culture   Final    STAPHYLOCOCCUS SPECIES (COAGULASE NEGATIVE) THE SIGNIFICANCE OF ISOLATING THIS ORGANISM FROM A SINGLE SET OF BLOOD CULTURES WHEN MULTIPLE SETS ARE DRAWN IS UNCERTAIN. PLEASE NOTIFY THE MICROBIOLOGY DEPARTMENT WITHIN ONE WEEK IF SPECIATION AND SENSITIVITIES ARE REQUIRED.    Report Status  06/05/2015 FINAL  Final  Blood culture (routine x 2)     Status: None (Preliminary result)   Collection Time: 06/02/15  4:55 PM  Result Value Ref Range Status   Specimen Description BLOOD RIGHT WRIST  Final   Special Requests BOTTLES DRAWN AEROBIC ONLY 5CC  Final   Culture NO GROWTH 4 DAYS  Final   Report Status PENDING  Incomplete  MRSA PCR Screening     Status: None   Collection Time: 06/02/15 10:14 PM  Result Value Ref Range Status   MRSA by PCR NEGATIVE NEGATIVE Final    Comment:        The GeneXpert MRSA Assay (FDA approved for NASAL specimens only), is one component of a comprehensive MRSA colonization surveillance program. It is not intended to diagnose MRSA infection nor to guide or monitor treatment for MRSA infections.      Labs: Basic Metabolic Panel:  Recent Labs Lab 06/02/15 1707 06/03/15 0246 06/05/15 0545 06/06/15 0540  NA 137 136 140 140  K 4.1 5.7* 4.2 3.2*  CL 100* 101 103 104  CO2 26 27 25 28   GLUCOSE 125* 168* 90 94  BUN 11 11 10  6  CREATININE 0.95 0.89 0.57 0.61  CALCIUM 8.9 8.2* 8.4* 8.3*   Liver Function Tests:  Recent Labs Lab 06/02/15 1707  AST 18  ALT 9*  ALKPHOS 73  BILITOT 0.6  PROT 6.0*  ALBUMIN 2.9*   No results for input(s): LIPASE, AMYLASE in the last 168 hours. No results for input(s): AMMONIA in the last 168 hours. CBC:  Recent Labs Lab 06/02/15 1707 06/03/15 0246 06/05/15 0545 06/06/15 0540  WBC 5.6 4.8 3.6* 3.7*  NEUTROABS 4.5  --   --   --   HGB 12.4 10.7* 11.5* 10.3*  HCT 38.0 34.2* 39.4 32.6*  MCV 93.6 94.0 91.2 92.6  PLT 175 173 126* 146*   Cardiac Enzymes: No results for input(s): CKTOTAL, CKMB, CKMBINDEX, TROPONINI in the last 168 hours. BNP: BNP (last 3 results) No results for input(s): BNP in the last 8760 hours.  ProBNP (last 3 results) No results for input(s): PROBNP in the last 8760 hours.  CBG:  Recent Labs Lab 06/04/15 2202 06/05/15 1131 06/05/15 1653 06/05/15 2119 06/06/15 1225    GLUCAP 90 136* 91 114* 177*       Signed:  Taylon Coole MD, PhD  [facility] 06/06/2015, 1:33 PM

## 2015-06-06 NOTE — NC FL2 (Signed)
[place] LEVEL OF CARE SCREENING TOOL     IDENTIFICATION  Patient Name: Dorothy Collins Birthdate: 10-15-1930 Sex: female Admission Date (Current Location): 06/02/2015  [facility] and [place] Number:  Herbalist and Address:  The [place]. [facility], [place] [address], [place], [place] [address]      Provider Number: [id number]  Attending Physician Name and Address:  Florencia Reasons, MD  Relative Name and Phone Number:  Shanon Brow, son, [phone]    Current Level of Care: Hospital Recommended Level of Care: [place] Prior Approval Number:    Date Approved/Denied:   PASRR Number:    Discharge Plan: SNF    Current Diagnoses: Patient Active Problem List   Diagnosis Date Noted  . Pressure ulcer 06/05/2015  . UTI (lower urinary tract infection) 06/02/2015  . Acute encephalopathy 06/02/2015  . SIRS (systemic inflammatory response syndrome) ([place]) 06/02/2015  . COPD exacerbation ([place]) 06/02/2015  . A-fib ([place]) 09/07/2013  . Atrial fibrillation with rapid ventricular response ([place]) 09/05/2013  . Hypokalemia 09/05/2013  . Nausea & vomiting 09/05/2013  . Emesis 09/05/2013  . Hypotension 08/16/2013  . Sepsis ([place]) 08/16/2013  . Aspiration into airway 08/16/2013  . H/O: CVA (cerebrovascular accident) 08/16/2013  . Hypoxia 08/16/2013  . AKI (acute kidney injury) ([place]) 08/16/2013  . Hypertension, benign renovascular 10/02/2012  . Late effects of CVA (cerebrovascular accident) 10/02/2012  . Peripheral neuropathy ([place]) 10/02/2012  . Chronic back pain 10/02/2012  . Dyslipidemia 10/02/2012  . Insomnia 10/02/2012  . GERD (gastroesophageal reflux disease) 10/02/2012  . Glaucoma 10/02/2012  . Constipation 10/02/2012  . Rhinitis, allergic 10/02/2012  . Depression 10/02/2012  . Vascular dementia without behavioral disturbance 10/02/2012  . Occlusion and stenosis of carotid artery without mention of cerebral infarction 09/01/2011    Orientation  RESPIRATION BLADDER Height & Weight     Self  O2 (2L) Incontinent Weight: 65.726 kg (144 lb 14.4 oz) (scale b) Height:  5\' 2"  (157.5 cm)  BEHAVIORAL SYMPTOMS/MOOD NEUROLOGICAL BOWEL NUTRITION STATUS      Continent  (Please see DC Summary)  AMBULATORY STATUS COMMUNICATION OF NEEDS Skin   Extensive Assist Verbally PU Stage and Appropriate Care (Deep tissue injury on heel; PU stage I on sacrum;)                       Personal Care Assistance Level of Assistance  Bathing, Feeding, Dressing Bathing Assistance: Maximum assistance Feeding assistance: Limited assistance Dressing Assistance: Maximum assistance     Functional Limitations Info             SPECIAL CARE FACTORS FREQUENCY                       Contractures      Additional Factors Info  Code Status, Allergies, Isolation Precautions, Psychotropic Code Status Info: DNR Allergies Info: Lyrica, Morphine And Related, Penicillins, Sulfa Antibiotics Psychotropic Info: Cymbalta   Isolation Precautions Info: Droplet precaution     Current Medications (06/06/2015):  This is the current hospital active medication list Current Facility-Administered Medications  Medication Dose Route Frequency Provider Last Rate Last Dose  . acetaminophen (TYLENOL) tablet 650 mg  650 mg Oral Q6H PRN Ripudeep K Rai, MD   650 mg at 06/04/15 2300   Or  . acetaminophen (TYLENOL) suppository 650 mg  650 mg Rectal Q6H PRN Ripudeep Krystal Eaton, MD      . aspirin EC tablet 81 mg  81  mg Oral Daily Florencia Reasons, MD   81 mg at 06/05/15 0951  . atorvastatin (LIPITOR) tablet 20 mg  20 mg Oral Daily Ripudeep Krystal Eaton, MD   20 mg at 06/05/15 0951  . benzonatate (TESSALON) capsule 100 mg  100 mg Oral TID Ripudeep Krystal Eaton, MD   100 mg at 06/05/15 2230  . cefTRIAXone (ROCEPHIN) 1 g in dextrose 5 % 50 mL IVPB  1 g Intravenous Q24H Ripudeep K Rai, MD   1 g at 06/05/15 1656  . clonazePAM (KLONOPIN) tablet 0.5 mg  0.5 mg Oral TID PRN Ripudeep K Rai, MD      .  cycloSPORINE (RESTASIS) 0.05 % ophthalmic emulsion 1 drop  1 drop Both Eyes BID Donne Hazel, MD   1 drop at 06/05/15 2232  . DULoxetine (CYMBALTA) DR capsule 60 mg  60 mg Oral Daily Donne Hazel, MD   60 mg at 06/05/15 0951  . gabapentin (NEURONTIN) capsule 200 mg  200 mg Oral TID Donne Hazel, MD   200 mg at 06/05/15 2231  . levalbuterol (XOPENEX) nebulizer solution 0.63 mg  0.63 mg Nebulization Q8H Florencia Reasons, MD   0.63 mg at 06/06/15 0753   And  . ipratropium (ATROVENT) nebulizer solution 0.5 mg  0.5 mg Nebulization TID Florencia Reasons, MD   0.5 mg at 06/06/15 0753  . ketotifen (ZADITOR) 0.025 % ophthalmic solution 1 drop  1 drop Both Eyes BID Donne Hazel, MD   1 drop at 06/05/15 2232  . lamoTRIgine (LAMICTAL) tablet 150 mg  150 mg Oral BID Donne Hazel, MD   150 mg at 06/05/15 2230  . latanoprost (XALATAN) 0.005 % ophthalmic solution 1 drop  1 drop Right Eye QHS Ripudeep Krystal Eaton, MD   1 drop at 06/05/15 2232  . memantine (NAMENDA) tablet 5 mg  5 mg Oral BID Ripudeep Krystal Eaton, MD   5 mg at 06/05/15 2231  . metoprolol tartrate (LOPRESSOR) tablet 12.5 mg  12.5 mg Oral BID Ripudeep K Rai, MD   12.5 mg at 06/05/15 2249  . mometasone-formoterol (DULERA) 100-5 MCG/ACT inhaler 2 puff  2 puff Inhalation BID Ripudeep Krystal Eaton, MD   2 puff at 06/06/15 0753  . ondansetron (ZOFRAN) tablet 4 mg  4 mg Oral Q6H PRN Ripudeep K Rai, MD       Or  . ondansetron (ZOFRAN) injection 4 mg  4 mg Intravenous Q6H PRN Ripudeep K Rai, MD      . oseltamivir (TAMIFLU) capsule 30 mg  30 mg Oral BID Donne Hazel, MD   30 mg at 06/05/15 2231  . pantoprazole (PROTONIX) EC tablet 40 mg  40 mg Oral BID AC Ripudeep Krystal Eaton, MD   40 mg at 06/06/15 0647  . phenol (CHLORASEPTIC) mouth spray 1 spray  1 spray Mouth/Throat PRN Rhetta Mura Schorr, NP      . polyethylene glycol (MIRALAX / GLYCOLAX) packet 17 g  17 g Oral Daily Donne Hazel, MD   17 g at 06/05/15 0951  . risperiDONE (RISPERDAL M-TABS) disintegrating tablet 1 mg  1 mg Oral BID  Donne Hazel, MD   1 mg at 06/05/15 2200  . rivaroxaban (XARELTO) tablet 20 mg  20 mg Oral Q supper Donne Hazel, MD   20 mg at 06/05/15 1655  . sodium chloride flush (NS) 0.9 % injection 3 mL  3 mL Intravenous Q12H Ripudeep Krystal Eaton, MD   3 mL at 06/05/15 [phone]  Discharge Medications: Please see discharge summary for a list of discharge medications.  Relevant Imaging Results:  Relevant Lab Results:   Additional Information SSN: [place], [place]

## 2015-06-07 LAB — CULTURE, BLOOD (ROUTINE X 2): Culture: NO GROWTH

## 2015-08-13 ENCOUNTER — Encounter: Payer: Medicare Other | Admitting: Cardiovascular Disease

## 2015-08-13 NOTE — Progress Notes (Signed)
This encounter was created in error - please disregard.

## 2015-08-14 ENCOUNTER — Ambulatory Visit (INDEPENDENT_AMBULATORY_CARE_PROVIDER_SITE_OTHER): Payer: Medicare Other | Admitting: Cardiovascular Disease

## 2015-08-14 ENCOUNTER — Encounter: Payer: Self-pay | Admitting: Cardiovascular Disease

## 2015-08-14 VITALS — BP 106/60 | HR 120 | Ht 62.0 in | Wt 140.0 lb

## 2015-08-14 DIAGNOSIS — I10 Essential (primary) hypertension: Secondary | ICD-10-CM

## 2015-08-14 DIAGNOSIS — Z79899 Other long term (current) drug therapy: Secondary | ICD-10-CM

## 2015-08-14 DIAGNOSIS — I480 Paroxysmal atrial fibrillation: Secondary | ICD-10-CM | POA: Diagnosis not present

## 2015-08-14 DIAGNOSIS — E785 Hyperlipidemia, unspecified: Secondary | ICD-10-CM | POA: Diagnosis not present

## 2015-08-14 NOTE — Progress Notes (Signed)
Cardiology Office Note   Date:  08/14/2015   ID:  Dorothy Collins, DOB Nov 13, 1930, MRN [id number]  PCP:  Wenda Low, MD  Cardiologist:   Skeet Latch, MD   Chief Complaint  Patient presents with  . Follow-up    6 months  pt states no Sx.--swelling is noticeable in left foot/ankle    History of Present Illness: Dorothy Collins is a 82 y.o. female with atrial fibrillation, stroke, carotid stenosis s/p L CEA, and hyperlipidemia who presents for management of atrial fibrillation.  Dorothy Collins was first seen 01/2015, at which times she complained of mild shortness of breath. She was started on Xarelto for presumed new atrial fibrillation.  She had previously been on Aggrenox for a prior stroke, which was discontinued given that she was started on Xarelto.  Thyroid function was ordered but She had an echo that revealed LVEF 55-60% and mild TR.  In March Dorothy Collins was admitted to the hospital with influenza.    Dorothy Collins is a resident of the [facility]. She thinks she is been there for approximately 11 years. She is accompanied today by a driver from the facility. Dorothy Collins reports that she is not feeling well today.  She is unable to articulate what is wrong. She denies chest pain or shortness of breath. She has not noted any lower extremity edema. She does report that she has not been sleeping well. She doesn't feel as though her heart is racing and has no lightheadedness or dizziness.   Past Medical History  Diagnosis Date  . Dementia   . CVA (cerebral vascular accident) ([place])   . COPD (chronic obstructive pulmonary disease) ([facility])     Emphysema  . GERD (gastroesophageal reflux disease)   . Glaucoma   . Hyperlipidemia   . Insomnia   . Rhinitis 12/14/1999  . Depression 10/12/2009  . Cancer ([place]) 07/18/2008    Breast cancer-Left  . Hypertension   . Arthritis 07/18/2008    osteoporosis  . Peripheral neuropathy ([place]) 07/18/2008  . Cerebrovascular disease 08/05/2003    acute  .  Lumbago 08/05/2003  . Edema 08/05/2003  . Cellulitis and abscess of leg 08/05/2003  . Peripheral vascular disease ([place]) 08/05/2003  . Anxiety 08/05/2003  . Hypopotassemia 08/05/2003  . Anemia     Past Surgical History  Procedure Laterality Date  . Carotid endarterectomy  2011    Left CEA  . Eye surgery  07/18/2008    cataract  . Fracture surgery  07/18/2008    closed  part neck femur  . Joint replacement      rt knee  . Breast surgery      rt     Current Outpatient Prescriptions  Medication Sig Dispense Refill  . amiodarone (PACERONE) 200 MG tablet Take 200 mg by mouth as directed. 2 TABLETS BY MOUTH TWICE A DAY FOR 7 DAYS AND THEN 1 TABLET DAILY    . aspirin EC 81 MG tablet Take 81 mg by mouth daily.    Marland Kitchen atorvastatin (LIPITOR) 20 MG tablet Take 20 mg by mouth daily.    . bimatoprost (LUMIGAN) 0.01 % SOLN Place 1 drop into the right eye at bedtime.    . budesonide-formoterol (SYMBICORT) 80-4.5 MCG/ACT inhaler Inhale 2 puffs into the lungs 2 (two) times daily. 1 Inhaler 0  . calcium-vitamin D (OSCAL WITH D) 500-200 MG-UNIT per tablet Take 1 tablet by mouth 2 (two) times daily.     . Cholecalciferol 2000  UNITS CAPS Take 2,000 Units by mouth daily.    . clonazePAM (KLONOPIN) 0.5 MG tablet Take 1 tablet (0.5 mg total) by mouth 3 (three) times daily as needed for anxiety. 30 tablet 0  . cycloSPORINE (RESTASIS) 0.05 % ophthalmic emulsion Place 1 drop into both eyes 2 (two) times daily.    Marland Kitchen doxycycline (VIBRAMYCIN) 100 MG capsule Take 1 capsule (100 mg total) by mouth 2 (two) times daily. 10 capsule 0  . DULoxetine (CYMBALTA) 30 MG capsule Take 2 capsules (60 mg total) by mouth daily. Takes with 60 mg for total of 90mg   3  . fexofenadine (ALLEGRA) 180 MG tablet Take 180 mg by mouth daily.    Marland Kitchen gabapentin (NEURONTIN) 400 MG capsule Take 1 capsule (400 mg total) by mouth 3 (three) times daily.    Marland Kitchen ketotifen (ZADITOR) 0.025 % ophthalmic solution Place 1 drop into both eyes 2 (two) times daily.  Wait 3-5 minutes between eye meds    . lamoTRIgine (LAMICTAL) 150 MG tablet Take 150 mg by mouth 2 (two) times daily.    Marland Kitchen lidocaine (LIDODERM) 5 % Place 1 patch onto the skin daily. Apply to back  -  Remove & Discard patch within 12 hours or as directed by MD. On for 12 hours off for 12 hours    . meclizine (ANTIVERT) 25 MG tablet Take 25 mg by mouth every 6 (six) hours as needed for dizziness.     . memantine (NAMENDA) 5 MG tablet Take 5 mg by mouth 2 (two) times daily.    . metoprolol tartrate (LOPRESSOR) 25 MG tablet Take 12.5 mg by mouth 2 (two) times daily.    Marland Kitchen omega-3 acid ethyl esters (LOVAZA) 1 G capsule Take 2 g by mouth 2 (two) times daily.    Marland Kitchen omeprazole (PRILOSEC) 20 MG capsule Take 40 mg by mouth 2 (two) times daily before a meal.     . oseltamivir (TAMIFLU) 30 MG capsule Take 1 capsule (30 mg total) by mouth 2 (two) times daily. 6 capsule 0  . Polyethyl Glycol-Propyl Glycol (SYSTANE OP) Place 1 drop into both eyes 4 (four) times daily.     . polyethylene glycol (MIRALAX / GLYCOLAX) packet Take 17 g by mouth daily.    . risperiDONE (RISPERDAL M-TABS) 1 MG disintegrating tablet Take 1 mg by mouth 2 (two) times daily.    . rivaroxaban (XARELTO) 20 MG TABS tablet Take 1 tablet (20 mg total) by mouth daily with supper. 30 tablet 7  . senna (SENOKOT) 8.6 MG tablet Take 1 tablet by mouth 2 (two) times daily.     Marland Kitchen tiotropium (SPIRIVA HANDIHALER) 18 MCG inhalation capsule Place 1 capsule (18 mcg total) into inhaler and inhale every morning. 30 capsule 0  . traZODone (DESYREL) 50 MG tablet Take 50 mg by mouth at bedtime.    Marland Kitchen ZOLMitriptan (ZOMIG) 2.5 MG tablet Take 2.5 mg by mouth as needed. For migraine     No current facility-administered medications for this visit.    Allergies:   Lyrica; Morphine and related; Penicillins; and Sulfa antibiotics    Social History:  The patient  reports that she quit smoking about 16 years ago. Her smoking use included Cigarettes. She does not have  any smokeless tobacco history on file. She reports that she does not drink alcohol or use illicit drugs.   Family History:  The patient's family history is not on file.    ROS:  Please see the history of present illness.  Otherwise, review of systems are positive for none.   All other systems are reviewed and negative.    PHYSICAL EXAM: VS:  BP 106/60 mmHg  Pulse 120  Ht 5\' 2"  (1.575 m)  Wt 140 lb (63.504 kg)  BMI 25.60 kg/m2 , BMI Body mass index is 25.6 kg/(m^2). GENERAL:  Elderly woman, chronically ill-appearing.  No acute distress.  HEENT:  Pupils equal round and reactive, fundi not visualized, oral mucosa unremarkable.  Multiple facial scabs.   NECK:  No jugular venous distention, waveform within normal limits, carotid upstroke brisk and symmetric, no bruits, no thyromegaly LYMPHATICS:  No cervical adenopathy LUNGS:  Clear to auscultation bilaterally HEART:  Irregularly irregular.  PMI not displaced or sustained,S1 and S2 within normal limits, no S3, no S4, no clicks, no rubs, no murmurs ABD:  Flat, positive bowel sounds normal in frequency in pitch, no bruits, no rebound, no guarding, no midline pulsatile mass, no hepatomegaly, no splenomegaly EXT:  2 plus pulses throughout, no edema, no cyanosis no clubbing SKIN:  No rashes no nodules NEURO:  Cranial nerves II through XII grossly intact, motor grossly intact throughout PSYCH:  Cognitively intact, oriented to person place and time   EKG:  EKG is ordered today. The ekg ordered today demonstrates atrial fibrillation. Rate 120 bpm. Nonspecific ST/T changes.  Echo 02/10/15: Study Conclusions  - Left ventricle: The cavity size was normal. Wall thickness was  normal. Systolic function was normal. The estimated ejection  fraction was in the range of 55% to 60%. Wall motion was normal;  there were no regional wall motion abnormalities. The study is  not technically sufficient to allow evaluation of LV diastolic   function. - Aortic valve: Trileaflet. Sclerosis without stenosis. There was  trivial regurgitation. - Mitral valve: Mildly thickened leaflets . There was mild  regurgitation. - Left atrium: The atrium was normal in size. - Tricuspid valve: There was mild regurgitation. Jet not adequate  to measure TR velocity. - Inferior vena cava: The vessel was normal in size. The  respirophasic diameter changes were in the normal range (>= 50%),  consistent with normal central venous pressure.   Recent Labs: 06/02/2015: ALT 9* 06/06/2015: BUN 6; Creatinine, Ser 0.61; Hemoglobin 10.3*; Platelets 146*; Potassium 3.2*; Sodium 140    Lipid Panel    Component Value Date/Time   CHOL * 12/03/2009 0610    231        ATP III CLASSIFICATION:  <200     mg/dL   Desirable  200-239  mg/dL   Borderline High  >=240    mg/dL   High          TRIG 332* 12/03/2009 0610   HDL 51 12/03/2009 0610   CHOLHDL 4.5 12/03/2009 0610   VLDL 66* 12/03/2009 0610   LDLCALC * 12/03/2009 0610    114        Total Cholesterol/HDL:CHD Risk Coronary Heart Disease Risk Table                     Men   Women  1/2 Average Risk   3.4   3.3  Average Risk       5.0   4.4  2 X Average Risk   9.6   7.1  3 X Average Risk  23.4   11.0        Use the calculated Patient Ratio above and the CHD Risk Table to determine the patient's CHD Risk.  ATP III CLASSIFICATION (LDL):  <100     mg/dL   Optimal  100-129  mg/dL   Near or Above                    Optimal  130-159  mg/dL   Borderline  160-189  mg/dL   High  >190     mg/dL   Very High      Wt Readings from Last 3 Encounters:  08/14/15 140 lb (63.504 kg)  06/06/15 144 lb 14.4 oz (65.726 kg)  01/27/15 147 lb (66.679 kg)      ASSESSMENT AND PLAN:  # Atrial fibrillation:  Dorothy Collins Remains in atrial fibrillation and the rates are poorly-controlled. Her blood pressure is too low to titrate her metoprolol. We will continue metoprolol 5 mg daily. We'll start  amiodarone 400 mg twice a day for 7 days followed by 200 mg daily.  We will check a compressive metabolic panel as well as thyroid function. I do not think should be able to cooperate for pulmonary function testing as she had difficulty following my directions for listening to her lungs.  Therefore we will defer PFTs.  Continue metoprolol and Xarelto. Given her dementia I would like to avoid DC cardioversion if possible. If her heart rate remains uncontrolled she has been loaded on amiodarone we will consider digoxin. This patients CHA2DS2-VASc Score and unadjusted Ischemic Stroke Rate (% per year) is equal to 7.2 % stroke rate/year from a score of 5  Above score calculated as 1 point each if present [CHF, HTN, DM, Vascular=MI/PAD/Aortic Plaque, Age if 65-74, or Female] Above score calculated as 2 points each if present [Age > 75, or Stroke/TIA/TE]  # Hypertension: Blood pressure well-controlled. Continue metoprolol.     # Hyperlipidemia: Continue atorvastatin.  We will check a fasting lipid panel and LFTs today.   # Prior CVA: Continue aspirin plus Xarelto.  Continue atorvastatin.   Current medicines are reviewed at length with the patient today.  The patient does not have concerns regarding medicines.  The following changes have been made:  Start amiodarone. Labs/ tests ordered today include:   Orders Placed This Encounter  Procedures  . Lipid panel  . Comprehensive Metabolic Panel (CMET)  . TSH  . EKG 12-Lead    Disposition:   FU with one of our PAs in 7-10 days.    Signed, Skeet Latch, MD  08/14/2015 5:24 PM    [place]

## 2015-08-14 NOTE — Patient Instructions (Addendum)
Medication Instructions:  START AMIODARONE 400 MG TWICE A DAY FOR 7 DAYS AND THEN DECREASE TO 200 MG DAILY  Labwork: LIPID/CMET/TSH ON THE FIRST FLOOR AT [facility]  Testing/Procedures: NONE  Follow-Up: Your physician recommends that you schedule a follow-up appointment in: 7-10 DAYS WITH NP/PA  If you need a refill on your cardiac medications before your next appointment, please call your pharmacy.

## 2015-08-16 ENCOUNTER — Encounter (HOSPITAL_COMMUNITY): Payer: Self-pay | Admitting: Internal Medicine

## 2015-08-16 ENCOUNTER — Emergency Department (HOSPITAL_COMMUNITY): Payer: Medicare Other

## 2015-08-16 ENCOUNTER — Inpatient Hospital Stay (HOSPITAL_COMMUNITY)
Admission: EM | Admit: 2015-08-16 | Discharge: 2015-09-13 | DRG: 871 | Disposition: E | Payer: Medicare Other | Attending: Internal Medicine | Admitting: Internal Medicine

## 2015-08-16 DIAGNOSIS — I10 Essential (primary) hypertension: Secondary | ICD-10-CM | POA: Diagnosis present

## 2015-08-16 DIAGNOSIS — Z7982 Long term (current) use of aspirin: Secondary | ICD-10-CM | POA: Diagnosis not present

## 2015-08-16 DIAGNOSIS — R7881 Bacteremia: Secondary | ICD-10-CM | POA: Diagnosis not present

## 2015-08-16 DIAGNOSIS — Z882 Allergy status to sulfonamides status: Secondary | ICD-10-CM | POA: Diagnosis not present

## 2015-08-16 DIAGNOSIS — M545 Low back pain: Secondary | ICD-10-CM | POA: Diagnosis present

## 2015-08-16 DIAGNOSIS — G629 Polyneuropathy, unspecified: Secondary | ICD-10-CM | POA: Diagnosis present

## 2015-08-16 DIAGNOSIS — J9601 Acute respiratory failure with hypoxia: Secondary | ICD-10-CM | POA: Diagnosis present

## 2015-08-16 DIAGNOSIS — F329 Major depressive disorder, single episode, unspecified: Secondary | ICD-10-CM | POA: Diagnosis present

## 2015-08-16 DIAGNOSIS — Z885 Allergy status to narcotic agent status: Secondary | ICD-10-CM

## 2015-08-16 DIAGNOSIS — K219 Gastro-esophageal reflux disease without esophagitis: Secondary | ICD-10-CM | POA: Diagnosis present

## 2015-08-16 DIAGNOSIS — N179 Acute kidney failure, unspecified: Secondary | ICD-10-CM | POA: Diagnosis present

## 2015-08-16 DIAGNOSIS — H409 Unspecified glaucoma: Secondary | ICD-10-CM | POA: Diagnosis present

## 2015-08-16 DIAGNOSIS — R7989 Other specified abnormal findings of blood chemistry: Secondary | ICD-10-CM | POA: Diagnosis present

## 2015-08-16 DIAGNOSIS — I110 Hypertensive heart disease with heart failure: Secondary | ICD-10-CM | POA: Diagnosis present

## 2015-08-16 DIAGNOSIS — B370 Candidal stomatitis: Secondary | ICD-10-CM | POA: Diagnosis present

## 2015-08-16 DIAGNOSIS — A4101 Sepsis due to Methicillin susceptible Staphylococcus aureus: Secondary | ICD-10-CM | POA: Diagnosis present

## 2015-08-16 DIAGNOSIS — Z96651 Presence of right artificial knee joint: Secondary | ICD-10-CM | POA: Diagnosis present

## 2015-08-16 DIAGNOSIS — Z7901 Long term (current) use of anticoagulants: Secondary | ICD-10-CM

## 2015-08-16 DIAGNOSIS — F419 Anxiety disorder, unspecified: Secondary | ICD-10-CM | POA: Diagnosis present

## 2015-08-16 DIAGNOSIS — I444 Left anterior fascicular block: Secondary | ICD-10-CM | POA: Diagnosis present

## 2015-08-16 DIAGNOSIS — G9340 Encephalopathy, unspecified: Secondary | ICD-10-CM

## 2015-08-16 DIAGNOSIS — R5383 Other fatigue: Secondary | ICD-10-CM | POA: Diagnosis present

## 2015-08-16 DIAGNOSIS — Z853 Personal history of malignant neoplasm of breast: Secondary | ICD-10-CM

## 2015-08-16 DIAGNOSIS — Z8673 Personal history of transient ischemic attack (TIA), and cerebral infarction without residual deficits: Secondary | ICD-10-CM | POA: Diagnosis not present

## 2015-08-16 DIAGNOSIS — F0150 Vascular dementia without behavioral disturbance: Secondary | ICD-10-CM | POA: Diagnosis present

## 2015-08-16 DIAGNOSIS — E785 Hyperlipidemia, unspecified: Secondary | ICD-10-CM | POA: Diagnosis present

## 2015-08-16 DIAGNOSIS — Z87891 Personal history of nicotine dependence: Secondary | ICD-10-CM

## 2015-08-16 DIAGNOSIS — A4901 Methicillin susceptible Staphylococcus aureus infection, unspecified site: Secondary | ICD-10-CM | POA: Diagnosis not present

## 2015-08-16 DIAGNOSIS — D6959 Other secondary thrombocytopenia: Secondary | ICD-10-CM | POA: Diagnosis present

## 2015-08-16 DIAGNOSIS — G9341 Metabolic encephalopathy: Secondary | ICD-10-CM | POA: Diagnosis present

## 2015-08-16 DIAGNOSIS — K5900 Constipation, unspecified: Secondary | ICD-10-CM | POA: Diagnosis present

## 2015-08-16 DIAGNOSIS — I739 Peripheral vascular disease, unspecified: Secondary | ICD-10-CM | POA: Diagnosis present

## 2015-08-16 DIAGNOSIS — I5033 Acute on chronic diastolic (congestive) heart failure: Secondary | ICD-10-CM | POA: Diagnosis present

## 2015-08-16 DIAGNOSIS — E876 Hypokalemia: Secondary | ICD-10-CM | POA: Diagnosis present

## 2015-08-16 DIAGNOSIS — I495 Sick sinus syndrome: Secondary | ICD-10-CM | POA: Diagnosis present

## 2015-08-16 DIAGNOSIS — I951 Orthostatic hypotension: Secondary | ICD-10-CM

## 2015-08-16 DIAGNOSIS — R509 Fever, unspecified: Secondary | ICD-10-CM | POA: Diagnosis not present

## 2015-08-16 DIAGNOSIS — Z7951 Long term (current) use of inhaled steroids: Secondary | ICD-10-CM | POA: Diagnosis not present

## 2015-08-16 DIAGNOSIS — M810 Age-related osteoporosis without current pathological fracture: Secondary | ICD-10-CM | POA: Diagnosis present

## 2015-08-16 DIAGNOSIS — Z880 Allergy status to penicillin: Secondary | ICD-10-CM | POA: Diagnosis not present

## 2015-08-16 DIAGNOSIS — G8929 Other chronic pain: Secondary | ICD-10-CM | POA: Diagnosis present

## 2015-08-16 DIAGNOSIS — I481 Persistent atrial fibrillation: Secondary | ICD-10-CM | POA: Diagnosis present

## 2015-08-16 DIAGNOSIS — M549 Dorsalgia, unspecified: Secondary | ICD-10-CM | POA: Diagnosis not present

## 2015-08-16 DIAGNOSIS — A419 Sepsis, unspecified organism: Secondary | ICD-10-CM

## 2015-08-16 DIAGNOSIS — I959 Hypotension, unspecified: Secondary | ICD-10-CM | POA: Diagnosis present

## 2015-08-16 DIAGNOSIS — Z515 Encounter for palliative care: Secondary | ICD-10-CM | POA: Diagnosis present

## 2015-08-16 DIAGNOSIS — R627 Adult failure to thrive: Secondary | ICD-10-CM | POA: Diagnosis present

## 2015-08-16 DIAGNOSIS — Z66 Do not resuscitate: Secondary | ICD-10-CM | POA: Diagnosis present

## 2015-08-16 DIAGNOSIS — J310 Chronic rhinitis: Secondary | ICD-10-CM | POA: Diagnosis present

## 2015-08-16 DIAGNOSIS — Z87440 Personal history of urinary (tract) infections: Secondary | ICD-10-CM

## 2015-08-16 DIAGNOSIS — R0600 Dyspnea, unspecified: Secondary | ICD-10-CM | POA: Diagnosis not present

## 2015-08-16 DIAGNOSIS — E860 Dehydration: Secondary | ICD-10-CM | POA: Diagnosis present

## 2015-08-16 DIAGNOSIS — Z8249 Family history of ischemic heart disease and other diseases of the circulatory system: Secondary | ICD-10-CM

## 2015-08-16 DIAGNOSIS — I4891 Unspecified atrial fibrillation: Secondary | ICD-10-CM | POA: Diagnosis not present

## 2015-08-16 DIAGNOSIS — I6529 Occlusion and stenosis of unspecified carotid artery: Secondary | ICD-10-CM | POA: Diagnosis present

## 2015-08-16 DIAGNOSIS — Z79899 Other long term (current) drug therapy: Secondary | ICD-10-CM | POA: Diagnosis not present

## 2015-08-16 DIAGNOSIS — Z888 Allergy status to other drugs, medicaments and biological substances status: Secondary | ICD-10-CM | POA: Diagnosis not present

## 2015-08-16 DIAGNOSIS — R0602 Shortness of breath: Secondary | ICD-10-CM

## 2015-08-16 DIAGNOSIS — N390 Urinary tract infection, site not specified: Secondary | ICD-10-CM | POA: Diagnosis present

## 2015-08-16 DIAGNOSIS — R401 Stupor: Secondary | ICD-10-CM | POA: Diagnosis present

## 2015-08-16 DIAGNOSIS — Z7189 Other specified counseling: Secondary | ICD-10-CM | POA: Diagnosis not present

## 2015-08-16 DIAGNOSIS — D696 Thrombocytopenia, unspecified: Secondary | ICD-10-CM | POA: Diagnosis present

## 2015-08-16 DIAGNOSIS — F32A Depression, unspecified: Secondary | ICD-10-CM | POA: Diagnosis present

## 2015-08-16 DIAGNOSIS — J449 Chronic obstructive pulmonary disease, unspecified: Secondary | ICD-10-CM | POA: Diagnosis present

## 2015-08-16 DIAGNOSIS — R6521 Severe sepsis with septic shock: Secondary | ICD-10-CM | POA: Diagnosis present

## 2015-08-16 DIAGNOSIS — I083 Combined rheumatic disorders of mitral, aortic and tricuspid valves: Secondary | ICD-10-CM | POA: Diagnosis present

## 2015-08-16 LAB — CBC WITH DIFFERENTIAL/PLATELET
BASOS ABS: 0 10*3/uL (ref 0.0–0.1)
Basophils Relative: 0 %
EOS ABS: 0 10*3/uL (ref 0.0–0.7)
EOS PCT: 0 %
HCT: 36.7 % (ref 36.0–46.0)
HEMOGLOBIN: 12.3 g/dL (ref 12.0–15.0)
Lymphocytes Relative: 12 %
Lymphs Abs: 0.9 10*3/uL (ref 0.7–4.0)
MCH: 29.6 pg (ref 26.0–34.0)
MCHC: 33.5 g/dL (ref 30.0–36.0)
MCV: 88.4 fL (ref 78.0–100.0)
Monocytes Absolute: 0.6 10*3/uL (ref 0.1–1.0)
Monocytes Relative: 8 %
NEUTROS PCT: 80 %
Neutro Abs: 6.1 10*3/uL (ref 1.7–7.7)
PLATELETS: 94 10*3/uL — AB (ref 150–400)
RBC: 4.15 MIL/uL (ref 3.87–5.11)
RDW: 14.2 % (ref 11.5–15.5)
WBC: 7.5 10*3/uL (ref 4.0–10.5)

## 2015-08-16 LAB — MAGNESIUM: Magnesium: 1.6 mg/dL — ABNORMAL LOW (ref 1.7–2.4)

## 2015-08-16 LAB — COMPREHENSIVE METABOLIC PANEL WITH GFR
ALT: 30 U/L (ref 14–54)
AST: 60 U/L — ABNORMAL HIGH (ref 15–41)
Albumin: 2.2 g/dL — ABNORMAL LOW (ref 3.5–5.0)
Alkaline Phosphatase: 64 U/L (ref 38–126)
Anion gap: 9 (ref 5–15)
BUN: 50 mg/dL — ABNORMAL HIGH (ref 6–20)
CO2: 21 mmol/L — ABNORMAL LOW (ref 22–32)
Calcium: 7.3 mg/dL — ABNORMAL LOW (ref 8.9–10.3)
Chloride: 102 mmol/L (ref 101–111)
Creatinine, Ser: 1.3 mg/dL — ABNORMAL HIGH (ref 0.44–1.00)
GFR calc Af Amer: 43 mL/min — ABNORMAL LOW
GFR calc non Af Amer: 37 mL/min — ABNORMAL LOW
Glucose, Bld: 167 mg/dL — ABNORMAL HIGH (ref 65–99)
Potassium: 3.2 mmol/L — ABNORMAL LOW (ref 3.5–5.1)
Sodium: 132 mmol/L — ABNORMAL LOW (ref 135–145)
Total Bilirubin: 0.6 mg/dL (ref 0.3–1.2)
Total Protein: 5.3 g/dL — ABNORMAL LOW (ref 6.5–8.1)

## 2015-08-16 LAB — URINALYSIS, ROUTINE W REFLEX MICROSCOPIC
Bilirubin Urine: NEGATIVE
Glucose, UA: NEGATIVE mg/dL
Ketones, ur: NEGATIVE mg/dL
Nitrite: NEGATIVE
Protein, ur: 30 mg/dL — AB
Specific Gravity, Urine: 1.021 (ref 1.005–1.030)
pH: 7.5 (ref 5.0–8.0)

## 2015-08-16 LAB — URINE MICROSCOPIC-ADD ON

## 2015-08-16 LAB — PROTIME-INR
INR: 2.64 — AB (ref 0.00–1.49)
PROTHROMBIN TIME: 26.9 s — AB (ref 11.6–15.2)

## 2015-08-16 LAB — APTT: aPTT: 48 seconds — ABNORMAL HIGH (ref 24–37)

## 2015-08-16 LAB — MRSA PCR SCREENING: MRSA by PCR: NEGATIVE

## 2015-08-16 LAB — PROCALCITONIN: Procalcitonin: 1.02 ng/mL

## 2015-08-16 LAB — I-STAT CG4 LACTIC ACID, ED
LACTIC ACID, VENOUS: 1.37 mmol/L (ref 0.5–2.0)
Lactic Acid, Venous: 2.74 mmol/L (ref 0.5–2.0)

## 2015-08-16 MED ORDER — DEXTROSE 5 % IV SOLN
2.0000 g | Freq: Once | INTRAVENOUS | Status: AC
  Administered 2015-08-16: 2 g via INTRAVENOUS
  Filled 2015-08-16: qty 2

## 2015-08-16 MED ORDER — SENNOSIDES-DOCUSATE SODIUM 8.6-50 MG PO TABS: 1.0000 | ORAL_TABLET | Freq: Every evening | ORAL | Status: DC | PRN

## 2015-08-16 MED ORDER — LATANOPROST 0.005 % OP SOLN
1.0000 [drp] | Freq: Every day | OPHTHALMIC | Status: DC
  Administered 2015-08-16 – 2015-08-19 (×4): 1 [drp] via OPHTHALMIC
  Filled 2015-08-16: qty 2.5

## 2015-08-16 MED ORDER — SORBITOL 70 % SOLN
30.0000 mL | Freq: Every day | Status: DC | PRN
  Filled 2015-08-16: qty 30

## 2015-08-16 MED ORDER — IPRATROPIUM BROMIDE 0.02 % IN SOLN
0.5000 mg | RESPIRATORY_TRACT | Status: DC | PRN
  Administered 2015-08-19: 0.5 mg via RESPIRATORY_TRACT
  Filled 2015-08-16: qty 2.5

## 2015-08-16 MED ORDER — LEVOFLOXACIN IN D5W 750 MG/150ML IV SOLN: 750.0000 mg | INTRAVENOUS | Status: DC

## 2015-08-16 MED ORDER — ACETAMINOPHEN 650 MG RE SUPP
650.0000 mg | Freq: Once | RECTAL | Status: AC
  Administered 2015-08-16: 650 mg via RECTAL
  Filled 2015-08-16: qty 1

## 2015-08-16 MED ORDER — ONDANSETRON HCL 4 MG PO TABS: 4.0000 mg | ORAL_TABLET | Freq: Four times a day (QID) | ORAL | Status: DC | PRN

## 2015-08-16 MED ORDER — SODIUM CHLORIDE 0.9 % IV SOLN: INTRAVENOUS | Status: DC

## 2015-08-16 MED ORDER — ONDANSETRON HCL 4 MG/2ML IJ SOLN
4.0000 mg | Freq: Four times a day (QID) | INTRAMUSCULAR | Status: DC | PRN
Start: 2015-08-16 — End: 2015-08-21
  Administered 2015-08-18: 4 mg via INTRAVENOUS
  Filled 2015-08-16: qty 2

## 2015-08-16 MED ORDER — SODIUM CHLORIDE 0.9 % IV SOLN
INTRAVENOUS | Status: DC
  Administered 2015-08-16 – 2015-08-17 (×2): via INTRAVENOUS
  Filled 2015-08-16 (×3): qty 1000

## 2015-08-16 MED ORDER — SODIUM CHLORIDE 0.9 % IV SOLN
INTRAVENOUS | Status: DC
  Administered 2015-08-16: 17:00:00 via INTRAVENOUS

## 2015-08-16 MED ORDER — CETYLPYRIDINIUM CHLORIDE 0.05 % MT LIQD
7.0000 mL | Freq: Two times a day (BID) | OROMUCOSAL | Status: DC
  Administered 2015-08-17 – 2015-08-20 (×5): 7 mL via OROMUCOSAL

## 2015-08-16 MED ORDER — SODIUM CHLORIDE 0.9 % IV BOLUS (SEPSIS)
1000.0000 mL | Freq: Once | INTRAVENOUS | Status: AC
  Administered 2015-08-16: 1000 mL via INTRAVENOUS

## 2015-08-16 MED ORDER — DEXTROSE 5 % IV SOLN: 2.0000 g | Freq: Once | INTRAVENOUS | Status: DC

## 2015-08-16 MED ORDER — HYDROCORTISONE NA SUCCINATE PF 100 MG IJ SOLR
50.0000 mg | Freq: Four times a day (QID) | INTRAMUSCULAR | Status: DC
  Administered 2015-08-16 – 2015-08-17 (×3): 50 mg via INTRAVENOUS
  Filled 2015-08-16 (×3): qty 2

## 2015-08-16 MED ORDER — LEVOFLOXACIN IN D5W 750 MG/150ML IV SOLN: 750.0000 mg | Freq: Once | INTRAVENOUS | Status: DC

## 2015-08-16 MED ORDER — LEVALBUTEROL HCL 0.63 MG/3ML IN NEBU
0.6300 mg | INHALATION_SOLUTION | RESPIRATORY_TRACT | Status: DC | PRN
  Administered 2015-08-19 – 2015-08-20 (×2): 0.63 mg via RESPIRATORY_TRACT
  Filled 2015-08-16 (×2): qty 3

## 2015-08-16 MED ORDER — LEVOFLOXACIN IN D5W 750 MG/150ML IV SOLN
750.0000 mg | Freq: Once | INTRAVENOUS | Status: AC
  Administered 2015-08-16: 750 mg via INTRAVENOUS
  Filled 2015-08-16: qty 150

## 2015-08-16 MED ORDER — FLEET ENEMA 7-19 GM/118ML RE ENEM: 1.0000 | ENEMA | Freq: Once | RECTAL | Status: DC | PRN

## 2015-08-16 MED ORDER — FAMOTIDINE IN NACL 20-0.9 MG/50ML-% IV SOLN
20.0000 mg | INTRAVENOUS | Status: DC
  Administered 2015-08-16 – 2015-08-17 (×2): 20 mg via INTRAVENOUS
  Filled 2015-08-16 (×2): qty 50

## 2015-08-16 MED ORDER — ACETAMINOPHEN 650 MG RE SUPP
650.0000 mg | Freq: Four times a day (QID) | RECTAL | Status: DC | PRN
  Administered 2015-08-20 (×2): 650 mg via RECTAL
  Filled 2015-08-16 (×2): qty 1

## 2015-08-16 MED ORDER — CYCLOSPORINE 0.05 % OP EMUL
1.0000 [drp] | Freq: Two times a day (BID) | OPHTHALMIC | Status: DC
  Administered 2015-08-16 – 2015-08-20 (×8): 1 [drp] via OPHTHALMIC
  Filled 2015-08-16 (×10): qty 1

## 2015-08-16 MED ORDER — SODIUM CHLORIDE 0.9% FLUSH
3.0000 mL | Freq: Two times a day (BID) | INTRAVENOUS | Status: DC
  Administered 2015-08-16 – 2015-08-20 (×4): 3 mL via INTRAVENOUS

## 2015-08-16 MED ORDER — HYDROCORTISONE NA SUCCINATE PF 100 MG IJ SOLR: 100.0000 mg | Freq: Four times a day (QID) | INTRAMUSCULAR | Status: DC

## 2015-08-16 MED ORDER — HEPARIN SODIUM (PORCINE) 5000 UNIT/ML IJ SOLN
5000.0000 [IU] | Freq: Three times a day (TID) | INTRAMUSCULAR | Status: DC
  Administered 2015-08-16 – 2015-08-17 (×2): 5000 [IU] via SUBCUTANEOUS
  Filled 2015-08-16 (×2): qty 1

## 2015-08-16 MED ORDER — DEXTROSE 5 % IV SOLN
2.0000 g | INTRAVENOUS | Status: DC
  Administered 2015-08-16: 2 g via INTRAVENOUS
  Filled 2015-08-16: qty 2

## 2015-08-16 MED ORDER — VANCOMYCIN HCL IN DEXTROSE 1-5 GM/200ML-% IV SOLN: 1000.0000 mg | INTRAVENOUS | Status: DC

## 2015-08-16 MED ORDER — PRO-STAT SUGAR FREE PO LIQD
30.0000 mL | Freq: Two times a day (BID) | ORAL | Status: DC
  Administered 2015-08-17 – 2015-08-19 (×2): 30 mL via ORAL
  Filled 2015-08-16 (×3): qty 30

## 2015-08-16 MED ORDER — BUDESONIDE 0.25 MG/2ML IN SUSP
0.2500 mg | Freq: Two times a day (BID) | RESPIRATORY_TRACT | Status: DC
  Administered 2015-08-16 – 2015-08-20 (×8): 0.25 mg via RESPIRATORY_TRACT
  Filled 2015-08-16 (×8): qty 2

## 2015-08-16 MED ORDER — VANCOMYCIN HCL IN DEXTROSE 1-5 GM/200ML-% IV SOLN
1000.0000 mg | Freq: Once | INTRAVENOUS | Status: AC
  Administered 2015-08-16: 1000 mg via INTRAVENOUS
  Filled 2015-08-16: qty 200

## 2015-08-16 MED ORDER — ACETAMINOPHEN 325 MG PO TABS
650.0000 mg | ORAL_TABLET | Freq: Four times a day (QID) | ORAL | Status: DC | PRN
  Administered 2015-08-18: 650 mg via ORAL
  Filled 2015-08-16: qty 2

## 2015-08-16 MED ORDER — CEFEPIME HCL 2 G IJ SOLR
2.0000 g | INTRAMUSCULAR | Status: DC
  Filled 2015-08-16: qty 2

## 2015-08-16 NOTE — ED Notes (Signed)
Per lab, CMP is currently running now and should be resulted in he next 23minutes.

## 2015-08-16 NOTE — H&P (Signed)
History and Physical    Dorothy Collins [id number] DOB: December 08, 1930 DOA: 08/21/2015  PCP: Wenda Low, MD  Patient coming from: [place] [facility]  Chief Complaint: Lethargic  HPI: Thereasa B Collins is a 82 y.o. female with medical history significant of atrial fibrillation on chronic anticoagulation, history of CVA, history of carotid stenosis status post left CEA, hyperlipidemia, dementia, history of recurrent UTIs, depression, anxiety who presented from nursing facility with increased lethargy 2 days and patient also noted to be hypotensive. Patient is demented and a such most of the history was obtained from the chart and per ED physician's report. At my interview patient was lethargic however opens eyes to noxious stimuli and answers some questions. Patient states she is not sure why she is in the emergency room. Patient does endorse being weak over the past 2-3 days. Patient denied any fevers, no chills, no nausea, no vomiting, no abdominal pain, no diarrhea, no constipation, no visual changes, no asymmetric weakness or numbness, no melena, no hematemesis, no hematochezia, no diarrhea, no constipation. Patient does endorse some dysuria.  ED Course: Patient was seen in the emergency room noted to be significantly lethargic and given 3 boluses of normal saline. Patient noted to have a blood pressure of 79/50 on arrival with a heart rate of 128 and a temperature 100.8. Compressive metabolic profile had a sodium of 132 potassium 3.2 bicarbonate 21 BUN 50 creatinine 1.30 glucose of 167 albumin of 2.2 AST of 60 protein of 5.3 otherwise was within normal limits. Initial lactic acid was elevated at 2.7 for a repeat lactic acid was 1.37. CBC had a platelet count of 94 otherwise was within normal limits. Urinalysis had large leukocytes nitrite negative to numerous to count WBCs. Chest x-ray with no acute cardiopulmonary abnormalities. EKG with A. fib with a rate of 114.  Review of  Systems: As per HPI otherwise 10 point review of systems negative.    Past Medical History  Diagnosis Date  . Dementia   . CVA (cerebral vascular accident) ([place])   . COPD (chronic obstructive pulmonary disease) (HCC)     Emphysema  . GERD (gastroesophageal reflux disease)   . Glaucoma   . Hyperlipidemia   . Insomnia   . Rhinitis 12/14/1999  . Depression 10/12/2009  . Cancer ([place]) 07/18/2008    Breast cancer-Left  . Hypertension   . Arthritis 07/18/2008    osteoporosis  . Peripheral neuropathy ([place]) 07/18/2008  . Cerebrovascular disease 08/05/2003    acute  . Lumbago 08/05/2003  . Edema 08/05/2003  . Cellulitis and abscess of leg 08/05/2003  . Peripheral vascular disease ([place]) 08/05/2003  . Anxiety 08/05/2003  . Hypopotassemia 08/05/2003  . Anemia     Past Surgical History  Procedure Laterality Date  . Carotid endarterectomy  2011    Left CEA  . Eye surgery  07/18/2008    cataract  . Fracture surgery  07/18/2008    closed  part neck femur  . Joint replacement      rt knee  . Breast surgery      rt     reports that she quit smoking about 16 years ago. Her smoking use included Cigarettes. She does not have any smokeless tobacco history on file. She reports that she does not drink alcohol or use illicit drugs.  Allergies  Allergen Reactions  . Lyrica [Pregabalin]     Unknown; listed on MAR  . Morphine And Related     Unknown; listed on MAR  .  Penicillins     Unknown; listed on MAR  . Sulfa Antibiotics     Unknown; listed on mar    History reviewed. No pertinent family history. Family history reviewed and not pertinent.  Prior to Admission medications   Medication Sig Start Date End Date Taking? Authorizing Provider  acetaminophen (TYLENOL) 650 MG CR tablet Take 650 mg by mouth every 4 (four) hours as needed for pain or fever.   Yes Historical Provider, MD  Amino Acids-Protein Hydrolys (FEEDING SUPPLEMENT, PRO-STAT SUGAR FREE 64,) LIQD Take 30 mLs by mouth 2 (two) times  daily.   Yes Historical Provider, MD  amiodarone (PACERONE) 200 MG tablet Take 200-400 mg by mouth as directed. Take 2 TABLETS BY MOUTH TWICE A DAY FOR 7 DAYS AND THEN 1 TABLET DAILY   Yes Historical Provider, MD  aspirin EC 81 MG tablet Take 81 mg by mouth daily.   Yes Historical Provider, MD  atorvastatin (LIPITOR) 20 MG tablet Take 20 mg by mouth daily.   Yes Historical Provider, MD  bimatoprost (LUMIGAN) 0.01 % SOLN Place 1 drop into the right eye at bedtime.   Yes Historical Provider, MD  budesonide-formoterol (SYMBICORT) 80-4.5 MCG/ACT inhaler Inhale 2 puffs into the lungs 2 (two) times daily. 06/06/15  Yes Florencia Reasons, MD  calcium-vitamin D (OSCAL WITH D) 500-200 MG-UNIT per tablet Take 1 tablet by mouth 2 (two) times daily.    Yes Historical Provider, MD  Cholecalciferol 2000 UNITS CAPS Take 2,000 Units by mouth daily.   Yes Historical Provider, MD  clonazePAM (KLONOPIN) 0.5 MG tablet Take 1 tablet (0.5 mg total) by mouth 3 (three) times daily as needed for anxiety. 09/07/13  Yes Kinnie Feil, MD  cycloSPORINE (RESTASIS) 0.05 % ophthalmic emulsion Place 1 drop into both eyes 2 (two) times daily.   Yes Historical Provider, MD  DULoxetine (CYMBALTA) 30 MG capsule Take 2 capsules (60 mg total) by mouth daily. Takes with 60 mg for total of 90mg  09/07/13  Yes Kinnie Feil, MD  fexofenadine (ALLEGRA) 180 MG tablet Take 180 mg by mouth daily.   Yes Historical Provider, MD  gabapentin (NEURONTIN) 400 MG capsule Take 1 capsule (400 mg total) by mouth 3 (three) times daily. 09/07/13  Yes Kinnie Feil, MD  ketotifen (ZADITOR) 0.025 % ophthalmic solution Place 1 drop into both eyes 2 (two) times daily. Wait 3-5 minutes between eye meds   Yes Historical Provider, MD  lamoTRIgine (LAMICTAL) 150 MG tablet Take 150 mg by mouth 2 (two) times daily.   Yes Historical Provider, MD  lidocaine (LIDODERM) 5 % Place 1 patch onto the skin daily. Apply to back  -  Remove & Discard patch within 12 hours or as  directed by MD. On for 12 hours off for 12 hours   Yes Historical Provider, MD  meclizine (ANTIVERT) 25 MG tablet Take 25 mg by mouth every 6 (six) hours as needed for dizziness.    Yes Historical Provider, MD  memantine (NAMENDA) 5 MG tablet Take 5 mg by mouth 2 (two) times daily.   Yes Historical Provider, MD  metoprolol tartrate (LOPRESSOR) 25 MG tablet Take 12.5 mg by mouth 2 (two) times daily.   Yes Historical Provider, MD  Multiple Vitamins-Minerals (DECUBI-VITE PO) Take 1 tablet by mouth daily.   Yes Historical Provider, MD  Nutritional Supplements (BOOST PLUS PO) Take 1 Can by mouth 3 (three) times daily with meals.   Yes Historical Provider, MD  omega-3 acid ethyl esters (LOVAZA) 1  G capsule Take 2 g by mouth 2 (two) times daily.   Yes Historical Provider, MD  omeprazole (PRILOSEC) 20 MG capsule Take 40 mg by mouth 2 (two) times daily before a meal.    Yes Historical Provider, MD  Polyethyl Glycol-Propyl Glycol (SYSTANE OP) Place 1 drop into both eyes 4 (four) times daily.    Yes Historical Provider, MD  polyethylene glycol (MIRALAX / GLYCOLAX) packet Take 17 g by mouth daily.   Yes Historical Provider, MD  risperiDONE (RISPERDAL M-TABS) 1 MG disintegrating tablet Take 1 mg by mouth 2 (two) times daily.   Yes Historical Provider, MD  rivaroxaban (XARELTO) 20 MG TABS tablet Take 1 tablet (20 mg total) by mouth daily with supper. 01/27/15  Yes Skeet Latch, MD  senna (SENOKOT) 8.6 MG tablet Take 1 tablet by mouth 2 (two) times daily.    Yes Historical Provider, MD  traZODone (DESYREL) 50 MG tablet Take 50 mg by mouth at bedtime.   Yes Historical Provider, MD  umeclidinium bromide (INCRUSE ELLIPTA) 62.5 MCG/INH AEPB Inhale 1 puff into the lungs daily.   Yes Historical Provider, MD  ZOLMitriptan (ZOMIG) 2.5 MG tablet Take 2.5 mg by mouth as needed. For migraine   Yes Historical Provider, MD  doxycycline (VIBRAMYCIN) 100 MG capsule Take 1 capsule (100 mg total) by mouth 2 (two) times  daily. Patient not taking: Reported on 09/12/2015 06/06/15   Florencia Reasons, MD  oseltamivir (TAMIFLU) 30 MG capsule Take 1 capsule (30 mg total) by mouth 2 (two) times daily. Patient not taking: Reported on 08/31/2015 06/06/15   Florencia Reasons, MD  tiotropium (SPIRIVA HANDIHALER) 18 MCG inhalation capsule Place 1 capsule (18 mcg total) into inhaler and inhale every morning. Patient not taking: Reported on 08/21/2015 06/06/15   Florencia Reasons, MD    Physical Exam: Filed Vitals:   08/28/2015 1600 09/12/2015 1630 09/03/2015 1633 08/28/2015 1700  BP: 87/53 87/50 87/50  89/51  Pulse: 101 110 117 100  Temp: 101.7 F (38.7 C) 101.3 F (38.5 C) 101.3 F (38.5 C) 100.8 F (38.2 C)  TempSrc:      Resp: 32 18 24 22   SpO2: 100% 100% 100% 99%      Constitutional: Elderly female laying on a gurney opens eyes to noxious stimuli. Drifts back to sleep. Filed Vitals:   08/15/2015 1600 09/12/2015 1630 08/18/2015 1633 08/15/2015 1700  BP: 87/53 87/50 87/50  89/51  Pulse: 101 110 117 100  Temp: 101.7 F (38.7 C) 101.3 F (38.5 C) 101.3 F (38.5 C) 100.8 F (38.2 C)  TempSrc:      Resp: 32 18 24 22   SpO2: 100% 100% 100% 99%   Eyes: PERRLA, lids and conjunctivae normal ENMT: Mucous membranes are extremely dry. Posterior pharynx clear of any exudate or lesions.Normal dentition.  Neck: normal, supple, no masses, no thyromegaly Respiratory: clear to auscultation bilaterally anterior lung fields, no wheezing, no crackles. Normal respiratory effort. No accessory muscle use.  Cardiovascular: Irregularly irregular. No extremity edema. 2+ pedal pulses. No carotid bruits.  Abdomen: no tenderness, no masses palpated. No hepatosplenomegaly. Bowel sounds positive.  Musculoskeletal: no clubbing / cyanosis. No joint deformity upper and lower extremities. Skin: no rashes, lesions, ulcers. No induration Neurologic: Unable to assess due to current mental status. Psychiatric: Unable to assess due to current mental status.   Labs on Admission: I have  personally reviewed following labs and imaging studies  CBC:  Recent Labs Lab 08/30/2015 1255  WBC 7.5  NEUTROABS 6.1  HGB 12.3  HCT  36.7  MCV 88.4  PLT 94*   Basic Metabolic Panel:  Recent Labs Lab 08/27/2015 1543  NA 132*  K 3.2*  CL 102  CO2 21*  GLUCOSE 167*  BUN 50*  CREATININE 1.30*  CALCIUM 7.3*   GFR: Estimated Creatinine Clearance: 29.2 mL/min (by C-G formula based on Cr of 1.3). Liver Function Tests:  Recent Labs Lab 09/12/2015 1543  AST 60*  ALT 30  ALKPHOS 64  BILITOT 0.6  PROT 5.3*  ALBUMIN 2.2*   No results for input(s): LIPASE, AMYLASE in the last 168 hours. No results for input(s): AMMONIA in the last 168 hours. Coagulation Profile: No results for input(s): INR, PROTIME in the last 168 hours. Cardiac Enzymes: No results for input(s): CKTOTAL, CKMB, CKMBINDEX, TROPONINI in the last 168 hours. BNP (last 3 results) No results for input(s): PROBNP in the last 8760 hours. HbA1C: No results for input(s): HGBA1C in the last 72 hours. CBG: No results for input(s): GLUCAP in the last 168 hours. Lipid Profile: No results for input(s): CHOL, HDL, LDLCALC, TRIG, CHOLHDL, LDLDIRECT in the last 72 hours. Thyroid Function Tests: No results for input(s): TSH, T4TOTAL, FREET4, T3FREE, THYROIDAB in the last 72 hours. Anemia Panel: No results for input(s): VITAMINB12, FOLATE, FERRITIN, TIBC, IRON, RETICCTPCT in the last 72 hours. Urine analysis:    Component Value Date/Time   COLORURINE AMBER* 09/05/2015 1519   APPEARANCEUR CLOUDY* 08/17/2015 1519   LABSPEC 1.021 08/21/2015 1519   PHURINE 7.5 08/22/2015 1519   GLUCOSEU NEGATIVE 09/03/2015 1519   HGBUR MODERATE* 08/28/2015 1519   BILIRUBINUR NEGATIVE 08/26/2015 1519   KETONESUR NEGATIVE 09/07/2015 1519   PROTEINUR 30* 09/03/2015 1519   UROBILINOGEN 0.2 08/16/2013 1635   NITRITE NEGATIVE 09/09/2015 1519   LEUKOCYTESUR LARGE* 08/21/2015 1519   Sepsis Labs:  !!!!!!!!!!!!!!!!!!!!!!!!!!!!!!!!!!!!!!!!!!!! @LABRCNTIP (procalcitonin:4,lacticidven:4) )No results found for this or any previous visit (from the past 240 hour(s)).   Radiological Exams on Admission: Dg Chest 2 View  09/02/2015  CLINICAL DATA:  Fever, lob lethargy and hypotension for 2 days. EXAM: CHEST  2 VIEW COMPARISON:  06/02/2015 FINDINGS: Mild cardiomegaly. Heart and mediastinal contours are within normal limits. No focal opacities or effusions. No acute bony abnormality. IMPRESSION: Mild cardiomegaly.  No active cardiopulmonary disease. Electronically Signed   By: Rolm Baptise M.D.   On: 08/21/2015 13:40    EKG: Independently reviewed. Afib HR 114  Assessment/Plan Principal Problem:   Septic shock due to urinary tract infection (HCC) Active Problems:   Peripheral neuropathy ([facility])   Chronic back pain   Dyslipidemia   GERD (gastroesophageal reflux disease)   Constipation   Depression   Vascular dementia without behavioral disturbance   Hypotension   H/O: CVA (cerebrovascular accident)   AKI (acute kidney injury) ([place])   Atrial fibrillation with rapid ventricular response (HCC)   Hypokalemia   UTI (lower urinary tract infection)   Acute encephalopathy   Dehydration     #1 septic shock secondary to UTI Patient presenting with sepsis and noted to be in septic shock, systolic blood pressure of 79. Patient also noted to have elevated lactic acid level, fever, tachycardia, tachypnea meeting criteria for sepsis. Urinalysis consistent with a UTI. Urine cultures ending. Blood cultures pending. Chest x-ray negative for any acute infiltrate. Patient has received 3-4 L of normal saline and systolic blood pressure in the high 80s to low 90s. Patient slightly more alert than on admission per nursing. Will place empirically on IV vancomycin and IV cefepime. IV fluids. May need IV Solu-Cortef.  ED physician states spoke with patient's daughter Abe People) who is the POA and she has stated that  patient is a DO NOT RESUSCITATE/DO NOT INTUBATE, no pressors. Family wants IV fluids and IV antibiotics at this time. I spoke with patient's daughter Abe People as well who verified scope treatment. Follow.  #2 urinary tract infection Urine cultures pending. IV cefepime.  #3 hypokalemia Replete.  #4 atrial fibrillation Patient with history of atrial fibrillation was on chronic anticoagulation. Anticoagulation on hold. Patient currently lethargic and as such will hold oral medications. Follow.  #5 acute kidney injury Likely secondary to prerenal azotemia. Check a fractional excretion of sodium. IV fluids. Supportive care. If no improvement in renal function we'll get a renal ultrasound. Follow for now.  #6 acute encephalopathy Likely secondary to problem #1. Patient opens eyes to noxious stimuli and is trying to answer some questions. Patient has been pancultured. Placed empirically on IV vancomycin and IV cefepime. IV fluids. Supportive care. Follow.  #7 dementia Patient currently lethargic. Hold all oral medications until clinical improvement.  #8 gastroesophageal reflux disease IV Pepcid.  #9 dehydration IV fluids.   #10 depression Hold oral medications secondary to patient's lethargy.  #11 history of CVA  once patient is more alert and tolerating oral intake will resume patient's home regimen of anticoagulation of Xarelto.     DVT prophylaxis: Heparin Code Status: DNR Family Communication: Updated daughter Abe People via telephone Disposition Plan: Back to NH when medically stable. Consults called: None Admission status: Admit to SDU   [facility] MD [facility] Pager [phone]  If 7PM-7AM, please contact night-coverage [email or web address] Password [facility]  08/14/2015, 5:58 PM

## 2015-08-16 NOTE — ED Notes (Signed)
Pt from [facility].  Per report has been lethargic x last 2 days, hypotensive (90/p), tachy, responsive to painful stimuli. Per EMS: 110 ST w/PAC's, RR 26, 80% RA up to 92% 4L/[place], 99.0, CBG 162.  Her baseline is alert w/slight confusion and she is active normally-can transfer herself to her motorized w/c.

## 2015-08-16 NOTE — ED Notes (Signed)
Pt in xray

## 2015-08-16 NOTE — ED Notes (Signed)
Bed: [id number] Expected date:  Expected time:  Means of arrival:  Comments: EMS- 82yo FTT- DNR

## 2015-08-16 NOTE — ED Provider Notes (Signed)
CSN: [id number]     Arrival date & time 09/05/2015  1228 History   First MD Initiated Contact with Patient 08/15/2015 1316     Chief Complaint  Patient presents with  . Failure To Thrive     (Consider location/radiation/quality/duration/timing/severity/associated sxs/prior Treatment) HPI Comments: Patient presents from nursing home with increased lethargy 2 days as well as some hypertension. Patient does have a history of dementia that her current symptoms are worse than in the past. EMS was called and patient was found to be tachycardic with low pulse admission. CBG was 162. Patient was transported here. She is a DO NOT RESUSCITATE. No further history obtainable due to her current condition.  The history is provided by medical records and the EMS personnel. The history is limited by the condition of the patient.    Past Medical History  Diagnosis Date  . Dementia   . CVA (cerebral vascular accident) ([place])   . COPD (chronic obstructive pulmonary disease) (HCC)     Emphysema  . GERD (gastroesophageal reflux disease)   . Glaucoma   . Hyperlipidemia   . Insomnia   . Rhinitis 12/14/1999  . Depression 10/12/2009  . Cancer ([place]) 07/18/2008    Breast cancer-Left  . Hypertension   . Arthritis 07/18/2008    osteoporosis  . Peripheral neuropathy ([place]) 07/18/2008  . Cerebrovascular disease 08/05/2003    acute  . Lumbago 08/05/2003  . Edema 08/05/2003  . Cellulitis and abscess of leg 08/05/2003  . Peripheral vascular disease ([place]) 08/05/2003  . Anxiety 08/05/2003  . Hypopotassemia 08/05/2003  . Anemia    Past Surgical History  Procedure Laterality Date  . Carotid endarterectomy  2011    Left CEA  . Eye surgery  07/18/2008    cataract  . Fracture surgery  07/18/2008    closed  part neck femur  . Joint replacement      rt knee  . Breast surgery      rt   No family history on file. Social History  Substance Use Topics  . Smoking status: Former Smoker    Types: Cigarettes    Quit date:  03/16/1999  . Smokeless tobacco: Not on file  . Alcohol Use: No   OB History    No data available     Review of Systems  Unable to perform ROS: Acuity of condition      Allergies  Lyrica; Morphine and related; Penicillins; and Sulfa antibiotics  Home Medications   Prior to Admission medications   Medication Sig Start Date End Date Taking? Authorizing Provider  amiodarone (PACERONE) 200 MG tablet Take 200 mg by mouth as directed. 2 TABLETS BY MOUTH TWICE A DAY FOR 7 DAYS AND THEN 1 TABLET DAILY    Historical Provider, MD  aspirin EC 81 MG tablet Take 81 mg by mouth daily.    Historical Provider, MD  atorvastatin (LIPITOR) 20 MG tablet Take 20 mg by mouth daily.    Historical Provider, MD  bimatoprost (LUMIGAN) 0.01 % SOLN Place 1 drop into the right eye at bedtime.    Historical Provider, MD  budesonide-formoterol (SYMBICORT) 80-4.5 MCG/ACT inhaler Inhale 2 puffs into the lungs 2 (two) times daily. 06/06/15   Florencia Reasons, MD  calcium-vitamin D (OSCAL WITH D) 500-200 MG-UNIT per tablet Take 1 tablet by mouth 2 (two) times daily.     Historical Provider, MD  Cholecalciferol 2000 UNITS CAPS Take 2,000 Units by mouth daily.    Historical Provider, MD  clonazePAM Bobbye [place]) 0.5  MG tablet Take 1 tablet (0.5 mg total) by mouth 3 (three) times daily as needed for anxiety. 09/07/13   Kinnie Feil, MD  cycloSPORINE (RESTASIS) 0.05 % ophthalmic emulsion Place 1 drop into both eyes 2 (two) times daily.    Historical Provider, MD  doxycycline (VIBRAMYCIN) 100 MG capsule Take 1 capsule (100 mg total) by mouth 2 (two) times daily. 06/06/15   Florencia Reasons, MD  DULoxetine (CYMBALTA) 30 MG capsule Take 2 capsules (60 mg total) by mouth daily. Takes with 60 mg for total of 90mg  09/07/13   Kinnie Feil, MD  fexofenadine (ALLEGRA) 180 MG tablet Take 180 mg by mouth daily.    Historical Provider, MD  gabapentin (NEURONTIN) 400 MG capsule Take 1 capsule (400 mg total) by mouth 3 (three) times daily. 09/07/13    Kinnie Feil, MD  ketotifen (ZADITOR) 0.025 % ophthalmic solution Place 1 drop into both eyes 2 (two) times daily. Wait 3-5 minutes between eye meds    Historical Provider, MD  lamoTRIgine (LAMICTAL) 150 MG tablet Take 150 mg by mouth 2 (two) times daily.    Historical Provider, MD  lidocaine (LIDODERM) 5 % Place 1 patch onto the skin daily. Apply to back  -  Remove & Discard patch within 12 hours or as directed by MD. On for 12 hours off for 12 hours    Historical Provider, MD  meclizine (ANTIVERT) 25 MG tablet Take 25 mg by mouth every 6 (six) hours as needed for dizziness.     Historical Provider, MD  memantine (NAMENDA) 5 MG tablet Take 5 mg by mouth 2 (two) times daily.    Historical Provider, MD  metoprolol tartrate (LOPRESSOR) 25 MG tablet Take 12.5 mg by mouth 2 (two) times daily.    Historical Provider, MD  omega-3 acid ethyl esters (LOVAZA) 1 G capsule Take 2 g by mouth 2 (two) times daily.    Historical Provider, MD  omeprazole (PRILOSEC) 20 MG capsule Take 40 mg by mouth 2 (two) times daily before a meal.     Historical Provider, MD  oseltamivir (TAMIFLU) 30 MG capsule Take 1 capsule (30 mg total) by mouth 2 (two) times daily. 06/06/15   Florencia Reasons, MD  Polyethyl Glycol-Propyl Glycol (SYSTANE OP) Place 1 drop into both eyes 4 (four) times daily.     Historical Provider, MD  polyethylene glycol (MIRALAX / GLYCOLAX) packet Take 17 g by mouth daily.    Historical Provider, MD  risperiDONE (RISPERDAL M-TABS) 1 MG disintegrating tablet Take 1 mg by mouth 2 (two) times daily.    Historical Provider, MD  rivaroxaban (XARELTO) 20 MG TABS tablet Take 1 tablet (20 mg total) by mouth daily with supper. 01/27/15   Skeet Latch, MD  senna (SENOKOT) 8.6 MG tablet Take 1 tablet by mouth 2 (two) times daily.     Historical Provider, MD  tiotropium (SPIRIVA HANDIHALER) 18 MCG inhalation capsule Place 1 capsule (18 mcg total) into inhaler and inhale every morning. 06/06/15   Florencia Reasons, MD  traZODone  (DESYREL) 50 MG tablet Take 50 mg by mouth at bedtime.    Historical Provider, MD  ZOLMitriptan (ZOMIG) 2.5 MG tablet Take 2.5 mg by mouth as needed. For migraine    Historical Provider, MD   BP 99/63 mmHg  Pulse 107  Temp(Src) 104 F (40 C) (Rectal)  Resp 25  SpO2 99% Physical Exam  Constitutional: She appears well-developed and well-nourished. She appears lethargic.  Non-toxic appearance. No distress.  HENT:  Head: Normocephalic and atraumatic.  Eyes: Conjunctivae, EOM and lids are normal. Pupils are equal, round, and reactive to light.  Neck: Normal range of motion. Neck supple. No tracheal deviation present. No thyroid mass present.  Cardiovascular: Regular rhythm and normal heart sounds.  Tachycardia present.  Exam reveals no gallop.   No murmur heard. Pulmonary/Chest: Effort normal and breath sounds normal. No stridor. No respiratory distress. She has no decreased breath sounds. She has no wheezes. She has no rhonchi. She has no rales.  Abdominal: Soft. Normal appearance and bowel sounds are normal. She exhibits no distension. There is no tenderness. There is no rebound and no CVA tenderness.  Musculoskeletal: Normal range of motion. She exhibits no edema or tenderness.  Neurological: She appears lethargic. She displays atrophy. GCS eye subscore is 2. GCS verbal subscore is 3. GCS motor subscore is 5.  Skin: Skin is warm and dry. No abrasion and no rash noted.  Nursing note and vitals reviewed.   ED Course  Procedures (including critical care time) Labs Review Labs Reviewed  I-STAT CG4 LACTIC ACID, ED - Abnormal; Notable for the following:    Lactic Acid, Venous 2.74 (*)    All other components within normal limits  CULTURE, BLOOD (ROUTINE X 2)  CULTURE, BLOOD (ROUTINE X 2)  URINE CULTURE  COMPREHENSIVE METABOLIC PANEL  CBC WITH DIFFERENTIAL/PLATELET  URINALYSIS, ROUTINE W REFLEX MICROSCOPIC (NOT AT [facility])  I-STAT CG4 LACTIC ACID, ED    Imaging Review No results  found. I have personally reviewed and evaluated these images and lab results as part of my medical decision-making.   EKG Interpretation   Date/Time:  Saturday Maila 03 2017 12:42:09 EDT Ventricular Rate:  114 PR Interval:    QRS Duration: 90 QT Interval:  318 QTC Calculation: 438 R Axis:   -59 Text Interpretation:  Atrial fibrillation LAD, consider left anterior  fascicular block Borderline low voltage, extremity leads No significant  change since last tracing Confirmed by Rodolfo Gaster  MD, Katti Pelle ([id number]) on  08/26/2015 1:38:25 PM      MDM   Final diagnoses:  None    Code sepsis initiated and patient given fluid bolus here. She had IV antibiotic started for empiric treatment and urine shows infection and appears to be the source of her infection. Patient's blood pressure has remained soft and is being treated with IV fluids. I spoke with the patient's medical power of attorney and at this point they would like her to only receive IV fluids and antibiotics. They do not wish IV pressors to be started. They want her DO NOT RESUSCITATE to be honored. Patient will be admitted to the hospitalist service  CRITICAL CARE Performed by: Leota Jacobsen Total critical care time: 50 minutes Critical care time was exclusive of separately billable procedures and treating other patients. Critical care was necessary to treat or prevent imminent or life-threatening deterioration. Critical care was time spent personally by me on the following activities: development of treatment plan with patient and/or surrogate as well as nursing, discussions with consultants, evaluation of patient's response to treatment, examination of patient, obtaining history from patient or surrogate, ordering and performing treatments and interventions, ordering and review of laboratory studies, ordering and review of radiographic studies, pulse oximetry and re-evaluation of patient's condition.     Lacretia Leigh, MD 08/15/2015  [phone]

## 2015-08-16 NOTE — Progress Notes (Signed)
Pharmacy Antibiotic Note  Dorothy Collins is a 82 y.o. female from [facility] who presented to [facility] ED on 08/30/2015 with failure to thrive, increased lethargy x 2 days. Patient tachycardic, hypotensive, and febrile in the ED. Code sepsis initiated. Patient received doses of Vancomycin, Levaquin, and Aztreonam in the ED. Upon admission, antibiotics changed to Vancomycin and Cefepime for septic shock secondary to UTI.  Plan: Vancomycin 1g IV q24h. Plan for Vancomycin trough level at steady state. Goal trough 15-20 mcg/mL. Cefepime 2g IV q24h. Monitor renal function, cultures, clinical course.  Height: 5\' 7"  (170.2 cm) Weight: 147 lb 7.8 oz (66.9 kg) IBW/kg (Calculated) : 61.6  Temp (24hrs), Avg:101.4 F (38.6 C), Min:100.4 F (38 C), Max:104 F (40 C)   Recent Labs Lab 09/12/2015 1255 09/11/2015 1322 08/28/2015 1543 08/26/2015 1556  WBC 7.5  --   --   --   CREATININE  --   --  1.30*  --   LATICACIDVEN  --  2.74*  --  1.37    Estimated Creatinine Clearance: 32.4 mL/min (by C-G formula based on Cr of 1.3).    Allergies  Allergen Reactions  . Lyrica [Pregabalin]     Unknown; listed on MAR  . Morphine And Related     Unknown; listed on MAR  . Penicillins     Unknown; listed on MAR  . Sulfa Antibiotics     Unknown; listed on mar    Antimicrobials this admission: 6/3 >> Levaquin x 1 6/3 >> Aztreonam x 1 6/3 >> Vancomycin >> 6/3 >> Cefepime >>  Dose adjustments this admission: --  Microbiology results: 6/3 BCx: sent 6/3 UCx: sent  6/3 MRSA PCR: sent   Thank you for allowing pharmacy to be a part of this patient's care.  Lindell Spar, PharmD, BCPS Pager: [phone] 09/06/2015 2:32 PM

## 2015-08-16 NOTE — ED Notes (Signed)
RN and Dr. Zenia Resides notified of pt's Lactic Acid of 2.74

## 2015-08-17 ENCOUNTER — Inpatient Hospital Stay (HOSPITAL_COMMUNITY): Payer: Medicare Other

## 2015-08-17 DIAGNOSIS — R7881 Bacteremia: Secondary | ICD-10-CM | POA: Insufficient documentation

## 2015-08-17 DIAGNOSIS — B370 Candidal stomatitis: Secondary | ICD-10-CM | POA: Diagnosis present

## 2015-08-17 DIAGNOSIS — A4101 Sepsis due to Methicillin susceptible Staphylococcus aureus: Secondary | ICD-10-CM | POA: Diagnosis present

## 2015-08-17 DIAGNOSIS — K5900 Constipation, unspecified: Secondary | ICD-10-CM

## 2015-08-17 DIAGNOSIS — D696 Thrombocytopenia, unspecified: Secondary | ICD-10-CM | POA: Diagnosis present

## 2015-08-17 LAB — CBC WITH DIFFERENTIAL/PLATELET
BASOS PCT: 0 %
Basophils Absolute: 0 10*3/uL (ref 0.0–0.1)
EOS ABS: 0 10*3/uL (ref 0.0–0.7)
EOS PCT: 0 %
HCT: 32.5 % — ABNORMAL LOW (ref 36.0–46.0)
HEMOGLOBIN: 10.6 g/dL — AB (ref 12.0–15.0)
LYMPHS ABS: 0.3 10*3/uL — AB (ref 0.7–4.0)
Lymphocytes Relative: 9 %
MCH: 30 pg (ref 26.0–34.0)
MCHC: 32.6 g/dL (ref 30.0–36.0)
MCV: 92.1 fL (ref 78.0–100.0)
MONO ABS: 0.2 10*3/uL (ref 0.1–1.0)
MONOS PCT: 5 %
NEUTROS PCT: 86 %
Neutro Abs: 3.1 10*3/uL (ref 1.7–7.7)
Platelets: 68 10*3/uL — ABNORMAL LOW (ref 150–400)
RBC: 3.53 MIL/uL — ABNORMAL LOW (ref 3.87–5.11)
RDW: 14.7 % (ref 11.5–15.5)
WBC: 3.5 10*3/uL — ABNORMAL LOW (ref 4.0–10.5)

## 2015-08-17 LAB — COMPREHENSIVE METABOLIC PANEL
ALK PHOS: 73 U/L (ref 38–126)
ALT: 33 U/L (ref 14–54)
AST: 61 U/L — AB (ref 15–41)
Albumin: 2.2 g/dL — ABNORMAL LOW (ref 3.5–5.0)
Anion gap: 6 (ref 5–15)
BUN: 34 mg/dL — AB (ref 6–20)
CALCIUM: 7.1 mg/dL — AB (ref 8.9–10.3)
CHLORIDE: 112 mmol/L — AB (ref 101–111)
CO2: 20 mmol/L — AB (ref 22–32)
CREATININE: 0.86 mg/dL (ref 0.44–1.00)
GFR calc non Af Amer: >60 mL/min (ref >60)
GLUCOSE: 117 mg/dL — AB (ref 65–99)
Potassium: 4.1 mmol/L (ref 3.5–5.1)
SODIUM: 138 mmol/L (ref 135–145)
Total Bilirubin: 0.5 mg/dL (ref 0.3–1.2)
Total Protein: 5.4 g/dL — ABNORMAL LOW (ref 6.5–8.1)

## 2015-08-17 LAB — URINE CULTURE

## 2015-08-17 LAB — BLOOD CULTURE ID PANEL (REFLEXED)
Acinetobacter baumannii: NOT DETECTED
CANDIDA ALBICANS: NOT DETECTED
CANDIDA PARAPSILOSIS: NOT DETECTED
Candida glabrata: NOT DETECTED
Candida krusei: NOT DETECTED
Candida tropicalis: NOT DETECTED
Carbapenem resistance: NOT DETECTED
ENTEROBACTER CLOACAE COMPLEX: NOT DETECTED
ENTEROCOCCUS SPECIES: NOT DETECTED
Enterobacteriaceae species: NOT DETECTED
Escherichia coli: NOT DETECTED
HAEMOPHILUS INFLUENZAE: NOT DETECTED
KLEBSIELLA PNEUMONIAE: NOT DETECTED
Klebsiella oxytoca: NOT DETECTED
Listeria monocytogenes: NOT DETECTED
METHICILLIN RESISTANCE: NOT DETECTED
Neisseria meningitidis: NOT DETECTED
PROTEUS SPECIES: NOT DETECTED
PSEUDOMONAS AERUGINOSA: NOT DETECTED
STAPHYLOCOCCUS AUREUS BCID: DETECTED — AB
STREPTOCOCCUS AGALACTIAE: NOT DETECTED
STREPTOCOCCUS PNEUMONIAE: NOT DETECTED
STREPTOCOCCUS PYOGENES: NOT DETECTED
Serratia marcescens: NOT DETECTED
Staphylococcus species: DETECTED — AB
Streptococcus species: NOT DETECTED
VANCOMYCIN RESISTANCE: NOT DETECTED

## 2015-08-17 LAB — CREATININE, URINE, RANDOM: Creatinine, Urine: 147.19 mg/dL

## 2015-08-17 LAB — GLUCOSE, CAPILLARY: Glucose-Capillary: 110 mg/dL — ABNORMAL HIGH (ref 65–99)

## 2015-08-17 LAB — SODIUM, URINE, RANDOM: Sodium, Ur: <10 mmol/L

## 2015-08-17 MED ORDER — SODIUM CHLORIDE 0.9 % IV SOLN: INTRAVENOUS | Status: DC

## 2015-08-17 MED ORDER — FLUCONAZOLE 100 MG PO TABS
100.0000 mg | ORAL_TABLET | Freq: Every day | ORAL | Status: DC
  Administered 2015-08-18 – 2015-08-19 (×2): 100 mg via ORAL
  Filled 2015-08-17 (×2): qty 1

## 2015-08-17 MED ORDER — FLUCONAZOLE IN SODIUM CHLORIDE 200-0.9 MG/100ML-% IV SOLN
200.0000 mg | Freq: Once | INTRAVENOUS | Status: AC
  Administered 2015-08-17: 200 mg via INTRAVENOUS
  Filled 2015-08-17: qty 100

## 2015-08-17 MED ORDER — VITAMIN D 1000 UNITS PO TABS
2000.0000 [IU] | ORAL_TABLET | Freq: Every day | ORAL | Status: DC
  Administered 2015-08-17 – 2015-08-19 (×3): 2000 [IU] via ORAL
  Filled 2015-08-17 (×6): qty 2

## 2015-08-17 MED ORDER — SENNA 8.6 MG PO TABS
1.0000 | ORAL_TABLET | Freq: Two times a day (BID) | ORAL | Status: DC
Start: 2015-08-17 — End: 2015-08-20
  Administered 2015-08-17 – 2015-08-19 (×4): 8.6 mg via ORAL
  Filled 2015-08-17 (×4): qty 1

## 2015-08-17 MED ORDER — LORATADINE 10 MG PO TABS
10.0000 mg | ORAL_TABLET | Freq: Every day | ORAL | Status: DC
  Administered 2015-08-17 – 2015-08-19 (×3): 10 mg via ORAL
  Filled 2015-08-17 (×3): qty 1

## 2015-08-17 MED ORDER — LAMOTRIGINE 150 MG PO TABS
150.0000 mg | ORAL_TABLET | Freq: Two times a day (BID) | ORAL | Status: DC
  Administered 2015-08-17 – 2015-08-19 (×6): 150 mg via ORAL
  Filled 2015-08-17 (×9): qty 1

## 2015-08-17 MED ORDER — BOOST PLUS PO LIQD
237.0000 mL | Freq: Three times a day (TID) | ORAL | Status: DC
  Administered 2015-08-17 (×2): 237 mL via ORAL
  Filled 2015-08-17 (×11): qty 237

## 2015-08-17 MED ORDER — AMIODARONE HCL 200 MG PO TABS
400.0000 mg | ORAL_TABLET | Freq: Two times a day (BID) | ORAL | Status: DC
  Administered 2015-08-17 – 2015-08-19 (×5): 400 mg via ORAL
  Filled 2015-08-17 (×5): qty 2

## 2015-08-17 MED ORDER — MECLIZINE HCL 25 MG PO TABS
25.0000 mg | ORAL_TABLET | Freq: Four times a day (QID) | ORAL | Status: DC | PRN
  Filled 2015-08-17: qty 1

## 2015-08-17 MED ORDER — CALCIUM CARBONATE-VITAMIN D 500-200 MG-UNIT PO TABS
1.0000 | ORAL_TABLET | Freq: Two times a day (BID) | ORAL | Status: DC
  Administered 2015-08-17 – 2015-08-19 (×6): 1 via ORAL
  Filled 2015-08-17 (×6): qty 1

## 2015-08-17 MED ORDER — DULOXETINE HCL 30 MG PO CPEP
60.0000 mg | ORAL_CAPSULE | Freq: Every day | ORAL | Status: DC
Start: 2015-08-17 — End: 2015-08-20
  Administered 2015-08-17 – 2015-08-19 (×3): 60 mg via ORAL
  Filled 2015-08-17 (×3): qty 2

## 2015-08-17 MED ORDER — SODIUM CHLORIDE 0.9 % IV SOLN
INTRAVENOUS | Status: DC
  Administered 2015-08-17 – 2015-08-18 (×2): via INTRAVENOUS

## 2015-08-17 MED ORDER — ATORVASTATIN CALCIUM 10 MG PO TABS
20.0000 mg | ORAL_TABLET | Freq: Every day | ORAL | Status: DC
  Administered 2015-08-17 – 2015-08-19 (×3): 20 mg via ORAL
  Filled 2015-08-17 (×3): qty 2

## 2015-08-17 MED ORDER — MAGNESIUM SULFATE 4 GM/100ML IV SOLN
4.0000 g | Freq: Once | INTRAVENOUS | Status: AC
  Administered 2015-08-17: 4 g via INTRAVENOUS
  Filled 2015-08-17: qty 100

## 2015-08-17 MED ORDER — OMEGA-3-ACID ETHYL ESTERS 1 G PO CAPS
2.0000 g | ORAL_CAPSULE | Freq: Two times a day (BID) | ORAL | Status: DC
  Administered 2015-08-17 – 2015-08-19 (×6): 2 g via ORAL
  Filled 2015-08-17 (×8): qty 2

## 2015-08-17 MED ORDER — CLONAZEPAM 0.5 MG PO TABS
0.5000 mg | ORAL_TABLET | Freq: Three times a day (TID) | ORAL | Status: DC | PRN
  Administered 2015-08-19: 0.5 mg via ORAL
  Filled 2015-08-17: qty 1

## 2015-08-17 MED ORDER — CEFAZOLIN SODIUM-DEXTROSE 2-4 GM/100ML-% IV SOLN
2.0000 g | Freq: Three times a day (TID) | INTRAVENOUS | Status: DC
  Administered 2015-08-17 – 2015-08-20 (×10): 2 g via INTRAVENOUS
  Filled 2015-08-17 (×16): qty 100

## 2015-08-17 MED ORDER — HYDROCORTISONE NA SUCCINATE PF 100 MG IJ SOLR
50.0000 mg | Freq: Three times a day (TID) | INTRAMUSCULAR | Status: DC
  Administered 2015-08-17 – 2015-08-18 (×3): 50 mg via INTRAVENOUS
  Filled 2015-08-17 (×3): qty 2

## 2015-08-17 MED ORDER — MEMANTINE HCL 5 MG PO TABS
5.0000 mg | ORAL_TABLET | Freq: Two times a day (BID) | ORAL | Status: DC
  Administered 2015-08-17 – 2015-08-19 (×6): 5 mg via ORAL
  Filled 2015-08-17 (×8): qty 1

## 2015-08-17 NOTE — Evaluation (Signed)
Clinical/Bedside Swallow Evaluation Patient Details  Name: Dorothy Collins MRN: [id number] Date of Birth: [birth year]  Today's Date: 08/17/2015 Time: SLP Start Time (ACUTE ONLY): 1657 SLP Stop Time (ACUTE ONLY): 1710 SLP Time Calculation (min) (ACUTE ONLY): 13 min  Past Medical History:  Past Medical History  Diagnosis Date  . Dementia   . CVA (cerebral vascular accident) ([place])   . COPD (chronic obstructive pulmonary disease) (HCC)     Emphysema  . GERD (gastroesophageal reflux disease)   . Glaucoma   . Hyperlipidemia   . Insomnia   . Rhinitis 12/14/1999  . Depression 10/12/2009  . Cancer ([place]) 07/18/2008    Breast cancer-Left  . Hypertension   . Arthritis 07/18/2008    osteoporosis  . Peripheral neuropathy ([place]) 07/18/2008  . Cerebrovascular disease 08/05/2003    acute  . Lumbago 08/05/2003  . Edema 08/05/2003  . Cellulitis and abscess of leg 08/05/2003  . Peripheral vascular disease ([place]) 08/05/2003  . Anxiety 08/05/2003  . Hypopotassemia 08/05/2003  . Anemia    Past Surgical History:  Past Surgical History  Procedure Laterality Date  . Carotid endarterectomy  2011    Left CEA  . Eye surgery  07/18/2008    cataract  . Fracture surgery  07/18/2008    closed  part neck femur  . Joint replacement      rt knee  . Breast surgery      rt   HPI:  82 y.o. female with medical history significant of dementia, GERD, esophageal dysmotility, small hiatal hernia, atrial fibrillation, CVA (2005), history of carotid stenosis status post left CEA, hyperlipidemia, dementia, history of recurrent UTIs, depression, anxiety who presented from nursing facility with increased lethargy 2 days and hypotensive. Found to have septic shock secondary to urinary tract infection and staph aureus bacteremia. Chest x-ray with no acute cardiopulmonary abnormalities. BSE 06/03/15 regular texture and thin liquids recommended.   Assessment / Plan / Recommendation Clinical Impression  Functional oropharyngeal swallow  across consistencies. Given lower denture plate absent and decreased endurance, recommend Dys 3 texture and thin liquids, straws allowed, pills with thin, sit upright. Pt denied difficulty swallowing. Educated reflux precautions; pt appears to be knowledgeable. No further ST needed at this time.     Aspiration Risk  Mild aspiration risk    Diet Recommendation Dysphagia 3 (Mech soft);Thin liquid   Liquid Administration via: Cup;Straw Medication Administration: Whole meds with liquid Supervision: Patient able to self feed;Intermittent supervision to cue for compensatory strategies Compensations: Slow rate;Small sips/bites Postural Changes: Seated upright at 90 degrees;Remain upright for at least 30 minutes after po intake    Other  Recommendations Oral Care Recommendations: Oral care BID   Follow up Recommendations  None    Frequency and Duration            Prognosis        Swallow Study   General HPI: 82 y.o. female with medical history significant of dementia, GERD, esophageal dysmotility, small hiatal hernia, atrial fibrillation, CVA (2005), history of carotid stenosis status post left CEA, hyperlipidemia, dementia, history of recurrent UTIs, depression, anxiety who presented from nursing facility with increased lethargy 2 days and hypotensive. Found to have septic shock secondary to urinary tract infection and staph aureus bacteremia. Chest x-ray with no acute cardiopulmonary abnormalities. BSE 06/03/15 regular texture and thin liquids recommended. Type of Study: Bedside Swallow Evaluation Previous Swallow Assessment:  (see HPI) Diet Prior to this Study: Thin liquids (full liquids) Temperature Spikes Noted: Yes Respiratory Status: Nasal  cannula History of Recent Intubation: No Behavior/Cognition: Alert;Cooperative;Pleasant mood Oral Cavity Assessment: Within Functional Limits Oral Care Completed by SLP: No Oral Cavity - Dentition: Dentures, top (lower denture being  repaired) Vision:  (visual disturbance) Self-Feeding Abilities: Able to feed self;Needs assist;Needs set up Patient Positioning: Upright in bed Baseline Vocal Quality: Normal Volitional Cough: Strong Volitional Swallow: Able to elicit    Oral/Motor/Sensory Function Overall Oral Motor/Sensory Function: Within functional limits   Ice Chips Ice chips: Not tested   Thin Liquid Thin Liquid: Within functional limits Presentation: Cup;Straw    Nectar Thick Nectar Thick Liquid: Not tested   Honey Thick Honey Thick Liquid: Not tested   Puree Puree: Within functional limits   Solid   GO   Solid: Within functional limits        Dorothy Collins 08/17/2015,5:19 PM [phone]

## 2015-08-17 NOTE — NC FL2 (Signed)
[place] LEVEL OF CARE SCREENING TOOL     IDENTIFICATION  Patient Name: Dorothy Collins Birthdate: [birth year] Sex: female Admission Date (Current Location): 08/27/2015  [facility] and [place] Number:  Herbalist and Address:  [facility] [place] [address] [place], [place] [address]      Provider Number: [id number]  Attending Physician Name and Address:  Eugenie Filler, MD  Relative Name and Phone Number:       Current Level of Care: Hospital Recommended Level of Care: [place] Prior Approval Number:    Date Approved/Denied:   PASRR Number:    Discharge Plan: SNF    Current Diagnoses: Patient Active Problem List   Diagnosis Date Noted  . Bacteremia: Probable 08/17/2015  . Staphylococcus aureus bacteremia with sepsis ([place]) 08/17/2015  . Thrombocytopenia ([place]) 08/17/2015  . Oral thrush 08/17/2015  . Septic shock due to urinary tract infection ([place]) 09/09/2015  . Dehydration 09/09/2015  . Pressure ulcer 06/05/2015  . UTI (lower urinary tract infection) 06/02/2015  . Acute encephalopathy 06/02/2015  . SIRS (systemic inflammatory response syndrome) ([place]) 06/02/2015  . COPD exacerbation ([place]) 06/02/2015  . A-fib ([place]) 09/07/2013  . Atrial fibrillation with rapid ventricular response ([place]) 09/05/2013  . Hypokalemia 09/05/2013  . Nausea & vomiting 09/05/2013  . Emesis 09/05/2013  . Hypotension 08/16/2013  . Aspiration into airway 08/16/2013  . H/O: CVA (cerebrovascular accident) 08/16/2013  . Hypoxia 08/16/2013  . AKI (acute kidney injury) ([place]) 08/16/2013  . Hypertension, benign renovascular 10/02/2012  . Late effects of CVA (cerebrovascular accident) 10/02/2012  . Peripheral neuropathy ([place]) 10/02/2012  . Chronic back pain 10/02/2012  . Dyslipidemia 10/02/2012  . Insomnia 10/02/2012  . GERD (gastroesophageal reflux disease) 10/02/2012  . Glaucoma 10/02/2012  . Constipation 10/02/2012  . Rhinitis,  allergic 10/02/2012  . Depression 10/02/2012  . Vascular dementia without behavioral disturbance 10/02/2012  . Occlusion and stenosis of carotid artery without mention of cerebral infarction 09/01/2011    Orientation RESPIRATION BLADDER Height & Weight     Self  O2 (3L [place]) Indwelling catheter Weight: 151 lb 7.3 oz (68.7 kg) Height:  5\' 7"  (170.2 cm)  BEHAVIORAL SYMPTOMS/MOOD NEUROLOGICAL BOWEL NUTRITION STATUS      Continent Diet (see DC summary)  AMBULATORY STATUS COMMUNICATION OF NEEDS Skin   Extensive Assist Verbally Normal                       Personal Care Assistance Level of Assistance  Bathing, Dressing Bathing Assistance: Maximum assistance   Dressing Assistance: Maximum assistance     Functional Limitations Info             SPECIAL CARE FACTORS FREQUENCY                       Contractures      Additional Factors Info  Code Status, Allergies, Psychotropic Code Status Info: DNR Allergies Info: Lyrica, Morphine And Related, Penicillins, Sulfa Antibiotics Psychotropic Info: cymbalta         Current Medications (08/17/2015):  This is the current hospital active medication list Current Facility-Administered Medications  Medication Dose Route Frequency Provider Last Rate Last Dose  . 0.9 %  sodium chloride infusion   Intravenous Continuous Eugenie Filler, MD 75 mL/hr at 08/17/15 1323    . acetaminophen (TYLENOL) tablet 650 mg  650 mg Oral Q6H PRN Eugenie Filler, MD  Or  . acetaminophen (TYLENOL) suppository 650 mg  650 mg Rectal Q6H PRN Eugenie Filler, MD      . amiodarone (PACERONE) tablet 400 mg  400 mg Oral BID Eugenie Filler, MD   400 mg at 08/17/15 0825  . antiseptic oral rinse (CPC / CETYLPYRIDINIUM CHLORIDE 0.05%) solution 7 mL  7 mL Mouth Rinse BID Eugenie Filler, MD   7 mL at 08/17/15 0946  . atorvastatin (LIPITOR) tablet 20 mg  20 mg Oral Daily Eugenie Filler, MD   20 mg at 08/17/15 1016  . budesonide (PULMICORT)  nebulizer solution 0.25 mg  0.25 mg Nebulization BID Eugenie Filler, MD   0.25 mg at 08/17/15 0829  . calcium-vitamin D (OSCAL WITH D) 500-200 MG-UNIT per tablet 1 tablet  1 tablet Oral BID Eugenie Filler, MD   1 tablet at 08/17/15 1017  . ceFAZolin (ANCEF) IVPB 2g/100 mL premix  2 g Intravenous Q8H Donald Prose Runyon, RPH   2 g at 08/17/15 1113  . cholecalciferol (VITAMIN D) tablet 2,000 Units  2,000 Units Oral Daily Eugenie Filler, MD   2,000 Units at 08/17/15 1016  . clonazePAM (KLONOPIN) tablet 0.5 mg  0.5 mg Oral TID PRN Eugenie Filler, MD      . cycloSPORINE (RESTASIS) 0.05 % ophthalmic emulsion 1 drop  1 drop Both Eyes BID Eugenie Filler, MD   1 drop at 08/17/15 0948  . DULoxetine (CYMBALTA) DR capsule 60 mg  60 mg Oral Daily Eugenie Filler, MD   60 mg at 08/17/15 1016  . famotidine (PEPCID) IVPB 20 mg premix  20 mg Intravenous Q24H Eugenie Filler, MD   20 mg at 08/28/2015 2034  . feeding supplement (PRO-STAT SUGAR FREE 64) liquid 30 mL  30 mL Oral BID Eugenie Filler, MD   30 mL at 09/07/2015 2200  . [START ON 08/18/2015] fluconazole (DIFLUCAN) tablet 100 mg  100 mg Oral Daily Irine Seal V, MD      . hydrocortisone sodium succinate (SOLU-CORTEF) 100 MG injection 50 mg  50 mg Intravenous Q8H Eugenie Filler, MD   50 mg at 08/17/15 1317  . ipratropium (ATROVENT) nebulizer solution 0.5 mg  0.5 mg Nebulization Q2H PRN Eugenie Filler, MD      . lactose free nutrition (BOOST PLUS) liquid 237 mL  237 mL Oral TID WC Eugenie Filler, MD   237 mL at 08/17/15 1511  . lamoTRIgine (LAMICTAL) tablet 150 mg  150 mg Oral BID Eugenie Filler, MD   150 mg at 08/17/15 1016  . latanoprost (XALATAN) 0.005 % ophthalmic solution 1 drop  1 drop Right Eye QHS Eugenie Filler, MD   1 drop at 09/09/2015 2212  . levalbuterol (XOPENEX) nebulizer solution 0.63 mg  0.63 mg Nebulization Q2H PRN Eugenie Filler, MD      . loratadine (CLARITIN) tablet 10 mg  10 mg Oral Daily Eugenie Filler, MD    10 mg at 08/17/15 1016  . meclizine (ANTIVERT) tablet 25 mg  25 mg Oral Q6H PRN Eugenie Filler, MD      . memantine [facility]) tablet 5 mg  5 mg Oral BID Eugenie Filler, MD   5 mg at 08/17/15 1016  . omega-3 acid ethyl esters (LOVAZA) capsule 2 g  2 g Oral BID Eugenie Filler, MD   2 g at 08/17/15 1016  . ondansetron (ZOFRAN) tablet 4 mg  4 mg Oral Q6H  PRN Eugenie Filler, MD       Or  . ondansetron [facility]) injection 4 mg  4 mg Intravenous Q6H PRN Eugenie Filler, MD      . senna [facility]) tablet 8.6 mg  1 tablet Oral BID Eugenie Filler, MD   8.6 mg at 08/17/15 1016  . senna-docusate (Senokot-S) tablet 1 tablet  1 tablet Oral QHS PRN Irine Seal V, MD      . sodium chloride flush (NS) 0.9 % injection 3 mL  3 mL Intravenous Q12H Eugenie Filler, MD   3 mL at 08/27/2015 2212  . sodium phosphate (FLEET) 7-19 GM/118ML enema 1 enema  1 enema Rectal Once PRN Eugenie Filler, MD      . sorbitol 70 % solution 30 mL  30 mL Oral Daily PRN Eugenie Filler, MD         Discharge Medications: Please see discharge summary for a list of discharge medications.  Relevant Imaging Results:  Relevant Lab Results:   Additional Information SSN: [id number] [place], [place]

## 2015-08-17 NOTE — Progress Notes (Addendum)
PROGRESS NOTE    Dorothy Collins  [id number] DOB: [birth year] DOA: 08/25/2015 PCP: Wenda Low, MD    Brief Narrative:  Patient is a 82 year old female nursing home resident with history of atrial fibrillation on chronic anticoagulation, recurrent UTIs, dementia, depression, who presents to the ED in septic shock secondary to UTI and staph aureus bacteremia. Patient aggressively hydrated with IV fluids and placed on stress dose steroids with improvement in hypotension and mentation.   Assessment & Plan:   Principal Problem:   Septic shock due to urinary tract infection ([place]) Active Problems:   Staphylococcus aureus bacteremia with sepsis (HCC)   Peripheral neuropathy (HCC)   Chronic back pain   Dyslipidemia   GERD (gastroesophageal reflux disease)   Constipation   Depression   Vascular dementia without behavioral disturbance   Hypotension   H/O: CVA (cerebrovascular accident)   AKI (acute kidney injury) ([place])   Atrial fibrillation with rapid ventricular response (HCC)   Hypokalemia   UTI (lower urinary tract infection)   Acute encephalopathy   Dehydration   Bacteremia: Probable   Thrombocytopenia (HCC)   Oral thrush  #1 septic shock secondary to urinary tract infection and staph aureus bacteremia Patient alert and following commands. Patient complaining of some dizziness. Blood pressure improved on aggressive IV fluid resuscitation and stress dose IV steroids. Blood cultures positive for staph for his bacteremia. Urine cultures pending. Continue empiric IV cefepime and IV vancomycin. Repeat chest x-ray negative for any acute infiltrate. 2 D Echo pending. Decrease stress dose steroids to Solu-Cortef 50 mg IV every 8 hours. Decrease IV fluids to 100 mL per hour. Supportive care. Follow.  #2 atrial fibrillation with RVR CHADS2Vasc SCORE 5 Likely secondary to problem #1. Patient was also noted to be lethargic on admission and likely had missed doses of oral amiodarone which  patient was recently started on per cardiology on 08/14/2015. Will place patient back on amiodarone 400 mg twice a day 7 days and then 400 mg daily per cardiology recommendations from office visit of 08/14/2015. Patient's INR was 2.64 on 08/30/2015. Patient now with a thrombocytopenia with a platelet count of 68 and a such will hold patient's anticoagulation on Xarelto. Follow.  #3 thrombocytopenia Likely secondary to acute infection of sepsis. Patient with no overt bleeding. Hold patient's chronic anticoagulation of Xarelto. Discontinue subcutaneous heparin. Follow.  #4 acute kidney injury Likely secondary to a prerenal azotemia. Urine sodium was less than 10. Urine protein was 30. Renal function improved with hydration. Follow.  #5 acute metabolic encephalopathy Secondary to problem #1. Patient alert as her questions appropriately. Clinical improvement. Continue empiric IV antibiotics.  #6 hypokalemia/hypomagnesemia Repleted. Keep magnesium greater than 2.  #7 dehydration Decrease IV fluids to 100 mL per hour.  #8 oral thrush Place on Diflucan.   #9 gastroesophageal reflux disease IV Pepcid.  #10 depression Resume home regimen of Cymbalta, Lamictal.  #11 vascular dementia Resume risperdal and namenda.   DVT prophylaxis: SCDs Code Status: DO NOT RESUSCITATE Family Communication: Updated patient. No family at bedside. Disposition Plan: Remain in step down unit. Back to nursing facility when medically stable.   Consultants:   None  Procedures:   Chest x-ray 09/09/2015, 08/17/2015    Antimicrobials:   IV cefepime 09/08/2015  IV vancomycin 09/05/2015   Subjective: Patient complaining of feeling dizzy. Patient noted to be in A. fib with heart rate in the 120s. Patient denies any chest pain. No shortness of breath.  Objective: Filed Vitals:   08/17/15 0500 08/17/15 0700 08/17/15  0800 08/17/15 0829  BP:  165/88 168/80 168/80  Pulse:   125 121  Temp:  99.7 F  (37.6 C) 100.2 F (37.9 C)   TempSrc:      Resp:  17 25 19   Height:      Weight: 68.7 kg (151 lb 7.3 oz)     SpO2:  99% 100% 95%    Intake/Output Summary (Last 24 hours) at 08/17/15 0927 Last data filed at 08/17/15 0803  Gross per 24 hour  Intake   2250 ml  Output   1250 ml  Net   1000 ml   Filed Weights   09/11/2015 1815 08/17/15 0500  Weight: 66.9 kg (147 lb 7.8 oz) 68.7 kg (151 lb 7.3 oz)    Examination:  General exam: Appears calm and comfortable.Sitting up at bedside. Respiratory system: Clear to auscultation. Respiratory effort normal. Cardiovascular system: Irregularly irregular. No JVD, murmurs, rubs, gallops or clicks. No pedal edema. Gastrointestinal system: Abdomen is nondistended, soft and nontender. No organomegaly or masses felt. Normal bowel sounds heard. Central nervous system: Alert and oriented. No focal neurological deficits. Extremities: Symmetric 5 x 5 power. Oropharynx: Upper gums with thrush noted. Psychiatry: Judgement and insight appear normal. Mood & affect appropriate.     Data Reviewed: I have personally reviewed following labs and imaging studies  CBC:  Recent Labs Lab 09/04/2015 1255 08/17/15 0334  WBC 7.5 3.5*  NEUTROABS 6.1 3.1  HGB 12.3 10.6*  HCT 36.7 32.5*  MCV 88.4 92.1  PLT 94* 68*   Basic Metabolic Panel:  Recent Labs Lab 08/17/2015 1543 08/29/2015 1835 08/17/15 0334  NA 132*  --  138  K 3.2*  --  4.1  CL 102  --  112*  CO2 21*  --  20*  GLUCOSE 167*  --  117*  BUN 50*  --  34*  CREATININE 1.30*  --  0.86  CALCIUM 7.3*  --  7.1*  MG  --  1.6*  --    GFR: Estimated Creatinine Clearance: 49 mL/min (by C-G formula based on Cr of 0.86). Liver Function Tests:  Recent Labs Lab 09/10/2015 1543 08/17/15 0334  AST 60* 61*  ALT 30 33  ALKPHOS 64 73  BILITOT 0.6 0.5  PROT 5.3* 5.4*  ALBUMIN 2.2* 2.2*   No results for input(s): LIPASE, AMYLASE in the last 168 hours. No results for input(s): AMMONIA in the last 168  hours. Coagulation Profile:  Recent Labs Lab 08/29/2015 1818  INR 2.64*   Cardiac Enzymes: No results for input(s): CKTOTAL, CKMB, CKMBINDEX, TROPONINI in the last 168 hours. BNP (last 3 results) No results for input(s): PROBNP in the last 8760 hours. HbA1C: No results for input(s): HGBA1C in the last 72 hours. CBG: No results for input(s): GLUCAP in the last 168 hours. Lipid Profile: No results for input(s): CHOL, HDL, LDLCALC, TRIG, CHOLHDL, LDLDIRECT in the last 72 hours. Thyroid Function Tests: No results for input(s): TSH, T4TOTAL, FREET4, T3FREE, THYROIDAB in the last 72 hours. Anemia Panel: No results for input(s): VITAMINB12, FOLATE, FERRITIN, TIBC, IRON, RETICCTPCT in the last 72 hours. Sepsis Labs:  Recent Labs Lab 08/15/2015 1322 09/04/2015 1556 09/10/2015 1818  PROCALCITON  --   --  1.02  LATICACIDVEN 2.74* 1.37  --     Recent Results (from the past 240 hour(s))  Culture, blood (Routine x 2)     Status: None (Preliminary result)   Collection Time: 08/15/2015  1:10 PM  Result Value Ref Range Status   Specimen  Description BLOOD LEFT ANTECUBITAL  Final   Special Requests BOTTLES DRAWN AEROBIC AND ANAEROBIC 1 CC EACH  Final   Culture  Setup Time   Final    IN BOTH AEROBIC AND ANAEROBIC BOTTLES GRAM POSITIVE COCCI IN CLUSTERS CRITICAL RESULT CALLED TO, READ BACK BY AND VERIFIED WITH: N GLOGOVAC,PHARMD AT [id number] 08/17/15 BY L BENFIELD Performed at [facility]    Culture PENDING  Incomplete   Report Status PENDING  Incomplete  Blood Culture ID Panel (Reflexed)     Status: Abnormal   Collection Time: 09/02/2015  1:10 PM  Result Value Ref Range Status   Enterococcus species NOT DETECTED NOT DETECTED Final   Vancomycin resistance NOT DETECTED NOT DETECTED Final   Listeria monocytogenes NOT DETECTED NOT DETECTED Final   Staphylococcus species DETECTED (A) NOT DETECTED Final    Comment: CRITICAL RESULT CALLED TO, READ BACK BY AND VERIFIED WITH: N GLOGOVAC,PHARMD AT 0735  08/17/15 BY L BENFIELD    Staphylococcus aureus DETECTED (A) NOT DETECTED Final    Comment: CRITICAL RESULT CALLED TO, READ BACK BY AND VERIFIED WITH: N GLOGOVAC,PHARMD AT 0735 08/17/15 BY L BENFIELD    Methicillin resistance NOT DETECTED NOT DETECTED Final   Streptococcus species NOT DETECTED NOT DETECTED Final   Streptococcus agalactiae NOT DETECTED NOT DETECTED Final   Streptococcus pneumoniae NOT DETECTED NOT DETECTED Final   Streptococcus pyogenes NOT DETECTED NOT DETECTED Final   Acinetobacter baumannii NOT DETECTED NOT DETECTED Final   Enterobacteriaceae species NOT DETECTED NOT DETECTED Final   Enterobacter cloacae complex NOT DETECTED NOT DETECTED Final   Escherichia coli NOT DETECTED NOT DETECTED Final   Klebsiella oxytoca NOT DETECTED NOT DETECTED Final   Klebsiella pneumoniae NOT DETECTED NOT DETECTED Final   Proteus species NOT DETECTED NOT DETECTED Final   Serratia marcescens NOT DETECTED NOT DETECTED Final   Carbapenem resistance NOT DETECTED NOT DETECTED Final   Haemophilus influenzae NOT DETECTED NOT DETECTED Final   Neisseria meningitidis NOT DETECTED NOT DETECTED Final   Pseudomonas aeruginosa NOT DETECTED NOT DETECTED Final   Candida albicans NOT DETECTED NOT DETECTED Final   Candida glabrata NOT DETECTED NOT DETECTED Final   Candida krusei NOT DETECTED NOT DETECTED Final   Candida parapsilosis NOT DETECTED NOT DETECTED Final   Candida tropicalis NOT DETECTED NOT DETECTED Final    Comment: Performed at [facility]  Culture, blood (Routine x 2)     Status: None (Preliminary result)   Collection Time: 08/31/2015  1:50 PM  Result Value Ref Range Status   Specimen Description BLOOD RIGHT HAND  Final   Special Requests IN PEDIATRIC BOTTLE 3 CC  Final   Culture  Setup Time   Final    PED BOTTLE GRAM POSITIVE COCCI IN CLUSTERS CRITICAL RESULT CALLED TO, READ BACK BY AND VERIFIED WITH: N GLOGOVAC,PARMD AT 0735 08/17/15 BY L BENFIELD Performed at [facility]      Culture PENDING  Incomplete   Report Status PENDING  Incomplete  MRSA PCR Screening     Status: None   Collection Time: 08/28/2015  6:19 PM  Result Value Ref Range Status   MRSA by PCR NEGATIVE NEGATIVE Final    Comment:        The GeneXpert MRSA Assay (FDA approved for NASAL specimens only), is one component of a comprehensive MRSA colonization surveillance program. It is not intended to diagnose MRSA infection nor to guide or monitor treatment for MRSA infections.  Radiology Studies: Dg Chest 2 View  08/27/2015  CLINICAL DATA:  Fever, lob lethargy and hypotension for 2 days. EXAM: CHEST  2 VIEW COMPARISON:  06/02/2015 FINDINGS: Mild cardiomegaly. Heart and mediastinal contours are within normal limits. No focal opacities or effusions. No acute bony abnormality. IMPRESSION: Mild cardiomegaly.  No active cardiopulmonary disease. Electronically Signed   By: Rolm Baptise M.D.   On: 09/06/2015 13:40   Dg Chest Port 1 View  08/17/2015  CLINICAL DATA:  Sepsis.  Emphysema. EXAM: PORTABLE CHEST 1 VIEW COMPARISON:  08/14/2015 FINDINGS: Heart is upper limits normal in size. No confluent airspace opacities. No effusions or acute bony abnormality. IMPRESSION: No active disease. Electronically Signed   By: Rolm Baptise M.D.   On: 08/17/2015 07:11        Scheduled Meds: . amiodarone  400 mg Oral BID  . antiseptic oral rinse  7 mL Mouth Rinse BID  . budesonide (PULMICORT) nebulizer solution  0.25 mg Nebulization BID  . ceFEPime (MAXIPIME) IV  2 g Intravenous Q24H  . cycloSPORINE  1 drop Both Eyes BID  . famotidine (PEPCID) IV  20 mg Intravenous Q24H  . feeding supplement (PRO-STAT SUGAR FREE 64)  30 mL Oral BID  . fluconazole (DIFLUCAN) IV  200 mg Intravenous Once  . [START ON 08/18/2015] fluconazole  100 mg Oral Daily  . hydrocortisone sod succinate (SOLU-CORTEF) inj  50 mg Intravenous Q8H  . latanoprost  1 drop Right Eye QHS  . magnesium sulfate 1 - 4 g bolus IVPB  4 g  Intravenous Once  . sodium chloride flush  3 mL Intravenous Q12H  . vancomycin  1,000 mg Intravenous Q24H   Continuous Infusions: . sodium chloride 0.9 % 1,000 mL with potassium chloride 40 mEq infusion 100 mL/hr at 08/17/15 0803     LOS: 1 day    Time spent: 61 minutes    THOMPSON,DANIEL, MD [facility] Pager [phone]   If 7PM-7AM, please contact night-coverage [email or web address] Password [email or web address] 08/17/2015, 9:27 AM

## 2015-08-17 NOTE — Clinical Social Work Note (Signed)
Clinical Social Work Assessment  Patient Details  Name: Dorothy Collins MRN: [id number] Date of Birth: 10-May-1930  Date of referral:  08/17/15               Reason for consult:  Facility Placement                Permission sought to share information with:  Family Supports, Chartered certified accountant granted to share information::  Yes, Hospital doctor (permission from pt dtr, Jeani Hawking)  Name::     [facility]  Agency::  [facility]  Relationship::  dtr  Contact Information:     Housing/Transportation Living arrangements for the past 2 months:  [place] of Information:  Adult Children Patient Interpreter Needed:  None Criminal Activity/Legal Involvement Pertinent to Current Situation/Hospitalization:  No - Comment as needed Significant Relationships:  Adult Children Lives with:  Facility Resident Do you feel safe going back to the place where you live?  Yes Need for family participation in patient care:  Yes (Comment) (decision making)  Care giving concerns: none- pt is LTC resident at [facility].   Social Worker assessment / plan:  CSW spoke with pt dtr, Jeani Hawking, to discuss plan for pt return to SNF when stable.  Employment status:  Retired Forensic scientist:  [employer] PT Recommendations:  [place] / Referral to community resources:  [place]  Patient/Family's Response to care: Dtr is agreeable to pt return to SNF when stable to DC from the hospital  Patient/Family's Understanding of and Emotional Response to Diagnosis, Current Treatment, and Prognosis:  dtr has no questions or concerns but is hopeful that pt will be able to return to SNF soon.  Emotional Assessment Appearance:  Appears stated age Attitude/Demeanor/Rapport:  Unable to Assess Affect (typically observed):  Unable to Assess Orientation:   (DOx4) Alcohol / Substance use:  Not Applicable Psych involvement (Current and /or in  the community):  No (Comment)  Discharge Needs  Concerns to be addressed:  Care Coordination Readmission within the last 30 days:  No Current discharge risk:  Physical Impairment Barriers to Discharge:  Continued Medical Work up   Cranford Mon, LCSW 08/17/2015, 3:46 PM

## 2015-08-17 NOTE — Progress Notes (Signed)
OT Cancellation Note  Patient Details Name: Dorothy Collins MRN: [id number] DOB: Feb 15, 1931   Cancelled Treatment:    Reason Eval/Treat Not Completed: OT screened, no needs identified, will sign off. Patient is long term care resident at [name]. Will defer all OT needs to SNF.  Rudy Domek A 08/17/2015, 8:17 AM

## 2015-08-17 NOTE — Progress Notes (Signed)
PHARMACY - PHYSICIAN COMMUNICATION CRITICAL VALUE ALERT - BLOOD CULTURE IDENTIFICATION (BCID)  Results for orders placed or performed during the hospital encounter of 08/18/2015  Blood Culture ID Panel (Reflexed) (Collected: 08/24/2015  1:10 PM)  Result Value Ref Range   Enterococcus species NOT DETECTED NOT DETECTED   Vancomycin resistance NOT DETECTED NOT DETECTED   Listeria monocytogenes NOT DETECTED NOT DETECTED   Staphylococcus species DETECTED (A) NOT DETECTED   Staphylococcus aureus DETECTED (A) NOT DETECTED   Methicillin resistance NOT DETECTED NOT DETECTED   Streptococcus species NOT DETECTED NOT DETECTED   Streptococcus agalactiae NOT DETECTED NOT DETECTED   Streptococcus pneumoniae NOT DETECTED NOT DETECTED   Streptococcus pyogenes NOT DETECTED NOT DETECTED   Acinetobacter baumannii NOT DETECTED NOT DETECTED   Enterobacteriaceae species NOT DETECTED NOT DETECTED   Enterobacter cloacae complex NOT DETECTED NOT DETECTED   Escherichia coli NOT DETECTED NOT DETECTED   Klebsiella oxytoca NOT DETECTED NOT DETECTED   Klebsiella pneumoniae NOT DETECTED NOT DETECTED   Proteus species NOT DETECTED NOT DETECTED   Serratia marcescens NOT DETECTED NOT DETECTED   Carbapenem resistance NOT DETECTED NOT DETECTED   Haemophilus influenzae NOT DETECTED NOT DETECTED   Neisseria meningitidis NOT DETECTED NOT DETECTED   Pseudomonas aeruginosa NOT DETECTED NOT DETECTED   Candida albicans NOT DETECTED NOT DETECTED   Candida glabrata NOT DETECTED NOT DETECTED   Candida krusei NOT DETECTED NOT DETECTED   Candida parapsilosis NOT DETECTED NOT DETECTED   Candida tropicalis NOT DETECTED NOT DETECTED    Name of physician (or Provider) Contacted: Dr. Grandville Silos  Changes to prescribed antibiotics required: Keeping current antibiotics for today since patient presented septic last night and urine culture still pending.  Will likely narrow tonight or tomorrow depending on when those result.  Hershal Coria 08/17/2015  8:05 AM

## 2015-08-17 NOTE — Consult Note (Signed)
Date of Admission:  09/04/2015  Date of Consult:  08/17/2015  Reason for Consult: MSSA bacteremia Referring Physician: "auto-consult" and Dr. Grandville Silos  HPI: Dorothy Collins is an 82 y.o. female with dementia, CVA, hx of right TKA admitted with lethargy, fevers, sepsis. She is with 2/2 Cultures with MSSA ID on "biofire"     Past Medical History  Diagnosis Date  . Dementia   . CVA (cerebral vascular accident) ([place])   . COPD (chronic obstructive pulmonary disease) (HCC)     Emphysema  . GERD (gastroesophageal reflux disease)   . Glaucoma   . Hyperlipidemia   . Insomnia   . Rhinitis 12/14/1999  . Depression 10/12/2009  . Cancer ([place]) 07/18/2008    Breast cancer-Left  . Hypertension   . Arthritis 07/18/2008    osteoporosis  . Peripheral neuropathy ([place]) 07/18/2008  . Cerebrovascular disease 08/05/2003    acute  . Lumbago 08/05/2003  . Edema 08/05/2003  . Cellulitis and abscess of leg 08/05/2003  . Peripheral vascular disease ([place]) 08/05/2003  . Anxiety 08/05/2003  . Hypopotassemia 08/05/2003  . Anemia     Past Surgical History  Procedure Laterality Date  . Carotid endarterectomy  2011    Left CEA  . Eye surgery  07/18/2008    cataract  . Fracture surgery  07/18/2008    closed  part neck femur  . Joint replacement      rt knee  . Breast surgery      rt    Social History:  reports that she quit smoking about 16 years ago. Her smoking use included Cigarettes. She does not have any smokeless tobacco history on file. She reports that she does not drink alcohol or use illicit drugs.   History reviewed. No pertinent family history.  Allergies  Allergen Reactions  . Lyrica [Pregabalin]     Unknown; listed on MAR  . Morphine And Related     Unknown; listed on MAR  . Penicillins     Unknown; listed on MAR  . Sulfa Antibiotics     Unknown; listed on mar     Medications: I have reviewed patients current medications as documented in Epic Anti-infectives    Start      Dose/Rate Route Frequency Ordered Stop   08/18/15 1400  levofloxacin (LEVAQUIN) IVPB 750 mg  Status:  Discontinued     750 mg 100 mL/hr over 90 Minutes Intravenous Every 48 hours 08/17/2015 1654 08/18/2015 1713   08/18/15 1000  fluconazole (DIFLUCAN) tablet 100 mg     100 mg Oral Daily 08/17/15 0927 08/25/15 0959   08/17/15 1200  vancomycin (VANCOCIN) IVPB 1000 mg/200 mL premix  Status:  Discontinued     1,000 mg 200 mL/hr over 60 Minutes Intravenous Every 24 hours 08/19/2015 1828 08/17/15 0945   08/17/15 1200  ceFAZolin (ANCEF) IVPB 2g/100 mL premix     2 g 200 mL/hr over 30 Minutes Intravenous Every 8 hours 08/17/15 1001     08/17/15 1000  fluconazole (DIFLUCAN) IVPB 200 mg     200 mg 100 mL/hr over 60 Minutes Intravenous  Once 08/17/15 0926 08/17/15 1121   09/03/2015 2200  ceFEPIme (MAXIPIME) 2 g in dextrose 5 % 50 mL IVPB  Status:  Discontinued     2 g 100 mL/hr over 30 Minutes Intravenous Every 24 hours 09/07/2015 1824 08/21/2015 1825   09/10/2015 2200  ceFEPIme (MAXIPIME) 2 g in dextrose 5 % 50 mL IVPB  Status:  Discontinued  2 g 100 mL/hr over 30 Minutes Intravenous Every 24 hours 09/06/2015 1825 08/17/15 0945   09/03/2015 1800  levofloxacin (LEVAQUIN) IVPB 750 mg  Status:  Discontinued     750 mg 100 mL/hr over 90 Minutes Intravenous  Once 08/30/2015 1757 09/09/2015 1815   08/29/2015 1800  aztreonam (AZACTAM) 2 g in dextrose 5 % 50 mL IVPB  Status:  Discontinued     2 g 100 mL/hr over 30 Minutes Intravenous  Once 09/05/2015 1757 09/09/2015 1815   08/28/2015 1400  levofloxacin (LEVAQUIN) IVPB 750 mg     750 mg 100 mL/hr over 90 Minutes Intravenous  Once 09/03/2015 1359 08/28/2015 1703   08/19/2015 1400  aztreonam (AZACTAM) 2 g in dextrose 5 % 50 mL IVPB     2 g 100 mL/hr over 30 Minutes Intravenous  Once 09/02/2015 1359 08/18/2015 1509   09/05/2015 1400  vancomycin (VANCOCIN) IVPB 1000 mg/200 mL premix     1,000 mg 200 mL/hr over 60 Minutes Intravenous  Once 08/15/2015 1359 08/19/2015 1555         ROS: as in  HPI otherwise remainder of 12 point Review of Systems is not obtainable due to patient's confusion   Blood pressure 128/75, pulse 107, temperature 99.3 F (37.4 C), temperature source Core (Comment), resp. rate 19, height '5\' 7"'$  (1.702 m), weight 151 lb 7.3 oz (68.7 kg), SpO2 100 %. General: Alert and awake, oriented person, place but cannot recall much re her admission and also denied having an artificial joint until I pointed out her scar to her  HEENT: anicteric sclera,  EOMI, oropharynx clear and without exudate Cardiovascular: irr irr egular rate, normal r,  no murmur rubs or gallops Pulmonary: clear to auscultation bilaterally, no wheezing, rales or rhonchi Gastrointestinal: soft nontender, nondistended, normal bowel sounds, Musculoskeletal: no  clubbing or edema noted bilaterally Skin, soft tissue: she has a rash on her face that she claims is chronic it appears excoriated Neuro: nonfocal, strength and sensation intact   Results for orders placed or performed during the hospital encounter of 09/05/2015 (from the past 48 hour(s))  CBC with Differential     Status: Abnormal   Collection Time: 08/24/2015 12:55 PM  Result Value Ref Range   WBC 7.5 4.0 - 10.5 K/uL   RBC 4.15 3.87 - 5.11 MIL/uL   Hemoglobin 12.3 12.0 - 15.0 g/dL   HCT 36.7 36.0 - 46.0 %   MCV 88.4 78.0 - 100.0 fL   MCH 29.6 26.0 - 34.0 pg   MCHC 33.5 30.0 - 36.0 g/dL   RDW 14.2 11.5 - 15.5 %   Platelets 94 (L) 150 - 400 K/uL    Comment: SPECIMEN CHECKED FOR CLOTS RESULT REPEATED AND VERIFIED PLATELET COUNT CONFIRMED BY SMEAR    Neutrophils Relative % 80 %   Neutro Abs 6.1 1.7 - 7.7 K/uL   Lymphocytes Relative 12 %   Lymphs Abs 0.9 0.7 - 4.0 K/uL   Monocytes Relative 8 %   Monocytes Absolute 0.6 0.1 - 1.0 K/uL   Eosinophils Relative 0 %   Eosinophils Absolute 0.0 0.0 - 0.7 K/uL   Basophils Relative 0 %   Basophils Absolute 0.0 0.0 - 0.1 K/uL  Culture, blood (Routine x 2)     Status: None (Preliminary result)    Collection Time: 09/05/2015  1:10 PM  Result Value Ref Range   Specimen Description BLOOD LEFT ANTECUBITAL    Special Requests BOTTLES DRAWN AEROBIC AND ANAEROBIC 1 CC EACH  Culture  Setup Time      IN BOTH AEROBIC AND ANAEROBIC BOTTLES GRAM POSITIVE COCCI IN CLUSTERS CRITICAL RESULT CALLED TO, READ BACK BY AND VERIFIED WITH: N GLOGOVAC,PHARMD AT 1914 08/17/15 BY L BENFIELD    Culture      NO GROWTH < 24 HOURS Performed at [facility]    Report Status PENDING   Blood Culture ID Panel (Reflexed)     Status: Abnormal   Collection Time: 09/04/2015  1:10 PM  Result Value Ref Range   Enterococcus species NOT DETECTED NOT DETECTED   Vancomycin resistance NOT DETECTED NOT DETECTED   Listeria monocytogenes NOT DETECTED NOT DETECTED   Staphylococcus species DETECTED (A) NOT DETECTED    Comment: CRITICAL RESULT CALLED TO, READ BACK BY AND VERIFIED WITH: N GLOGOVAC,PHARMD AT 0735 08/17/15 BY L BENFIELD    Staphylococcus aureus DETECTED (A) NOT DETECTED    Comment: CRITICAL RESULT CALLED TO, READ BACK BY AND VERIFIED WITH: N GLOGOVAC,PHARMD AT 0735 08/17/15 BY L BENFIELD    Methicillin resistance NOT DETECTED NOT DETECTED   Streptococcus species NOT DETECTED NOT DETECTED   Streptococcus agalactiae NOT DETECTED NOT DETECTED   Streptococcus pneumoniae NOT DETECTED NOT DETECTED   Streptococcus pyogenes NOT DETECTED NOT DETECTED   Acinetobacter baumannii NOT DETECTED NOT DETECTED   Enterobacteriaceae species NOT DETECTED NOT DETECTED   Enterobacter cloacae complex NOT DETECTED NOT DETECTED   Escherichia coli NOT DETECTED NOT DETECTED   Klebsiella oxytoca NOT DETECTED NOT DETECTED   Klebsiella pneumoniae NOT DETECTED NOT DETECTED   Proteus species NOT DETECTED NOT DETECTED   Serratia marcescens NOT DETECTED NOT DETECTED   Carbapenem resistance NOT DETECTED NOT DETECTED   Haemophilus influenzae NOT DETECTED NOT DETECTED   Neisseria meningitidis NOT DETECTED NOT DETECTED   Pseudomonas  aeruginosa NOT DETECTED NOT DETECTED   Candida albicans NOT DETECTED NOT DETECTED   Candida glabrata NOT DETECTED NOT DETECTED   Candida krusei NOT DETECTED NOT DETECTED   Candida parapsilosis NOT DETECTED NOT DETECTED   Candida tropicalis NOT DETECTED NOT DETECTED    Comment: Performed at [facility]  I-Stat CG4 Lactic Acid, ED     Status: Abnormal   Collection Time: 08/21/2015  1:22 PM  Result Value Ref Range   Lactic Acid, Venous 2.74 (HH) 0.5 - 2.0 mmol/L   Comment NOTIFIED PHYSICIAN   Culture, blood (Routine x 2)     Status: None (Preliminary result)   Collection Time: 08/25/2015  1:50 PM  Result Value Ref Range   Specimen Description BLOOD RIGHT HAND    Special Requests IN PEDIATRIC BOTTLE 3 CC    Culture  Setup Time      PED BOTTLE GRAM POSITIVE COCCI IN CLUSTERS CRITICAL RESULT CALLED TO, READ BACK BY AND VERIFIED WITH: N GLOGOVAC,PARMD AT 0735 08/17/15 BY L BENFIELD    Culture      NO GROWTH < 24 HOURS Performed at [facility]    Report Status PENDING   Urine culture     Status: Abnormal   Collection Time: 08/27/2015  3:19 PM  Result Value Ref Range   Specimen Description URINE, CATHETERIZED    Special Requests NONE    Culture (A)     <10,000 COLONIES/mL INSIGNIFICANT GROWTH Performed at [facility]    Report Status 08/17/2015 FINAL   Urinalysis, Routine w reflex microscopic     Status: Abnormal   Collection Time: 08/22/2015  3:19 PM  Result Value Ref Range   Color, Urine  AMBER (A) YELLOW    Comment: BIOCHEMICALS MAY BE AFFECTED BY COLOR   APPearance CLOUDY (A) CLEAR   Specific Gravity, Urine 1.021 1.005 - 1.030   pH 7.5 5.0 - 8.0   Glucose, UA NEGATIVE NEGATIVE mg/dL   Hgb urine dipstick MODERATE (A) NEGATIVE   Bilirubin Urine NEGATIVE NEGATIVE   Ketones, ur NEGATIVE NEGATIVE mg/dL   Protein, ur 30 (A) NEGATIVE mg/dL   Nitrite NEGATIVE NEGATIVE   Leukocytes, UA LARGE (A) NEGATIVE  Urine microscopic-add on     Status: Abnormal   Collection  Time: 08/31/2015  3:19 PM  Result Value Ref Range   Squamous Epithelial / LPF 0-5 (A) NONE SEEN   WBC, UA TOO NUMEROUS TO COUNT 0 - 5 WBC/hpf   RBC / HPF 0-5 0 - 5 RBC/hpf   Bacteria, UA MANY (A) NONE SEEN   Urine-Other MUCOUS PRESENT   Comprehensive metabolic panel     Status: Abnormal   Collection Time: 08/19/2015  3:43 PM  Result Value Ref Range   Sodium 132 (L) 135 - 145 mmol/L   Potassium 3.2 (L) 3.5 - 5.1 mmol/L   Chloride 102 101 - 111 mmol/L   CO2 21 (L) 22 - 32 mmol/L   Glucose, Bld 167 (H) 65 - 99 mg/dL   BUN 50 (H) 6 - 20 mg/dL   Creatinine, Ser 1.30 (H) 0.44 - 1.00 mg/dL   Calcium 7.3 (L) 8.9 - 10.3 mg/dL   Total Protein 5.3 (L) 6.5 - 8.1 g/dL   Albumin 2.2 (L) 3.5 - 5.0 g/dL   AST 60 (H) 15 - 41 U/L   ALT 30 14 - 54 U/L   Alkaline Phosphatase 64 38 - 126 U/L   Total Bilirubin 0.6 0.3 - 1.2 mg/dL   GFR calc non Af Amer 37 (L) >60 mL/min   GFR calc Af Amer 43 (L) >60 mL/min    Comment: (NOTE) The eGFR has been calculated using the CKD EPI equation. This calculation has not been validated in all clinical situations. eGFR's persistently <60 mL/min signify possible Chronic Kidney Disease.    Anion gap 9 5 - 15  I-Stat CG4 Lactic Acid, ED  (not at  [facility])     Status: None   Collection Time: 09/02/2015  3:56 PM  Result Value Ref Range   Lactic Acid, Venous 1.37 0.5 - 2.0 mmol/L  Sodium, urine, random     Status: None   Collection Time: 08/31/2015  6:17 PM  Result Value Ref Range   Sodium, Ur <10 mmol/L    Comment: Performed at [facility]  Creatinine, urine, random     Status: None   Collection Time: 08/31/2015  6:17 PM  Result Value Ref Range   Creatinine, Urine 147.19 mg/dL    Comment: Performed at [facility]  Procalcitonin     Status: None   Collection Time: 09/10/2015  6:18 PM  Result Value Ref Range   Procalcitonin 1.02 ng/mL    Comment:        Interpretation: PCT > 0.5 ng/mL and <= 2 ng/mL: Systemic infection (sepsis) is possible, but other  conditions are known to elevate PCT as well. (NOTE)         ICU PCT Algorithm               Non ICU PCT Algorithm    ----------------------------     ------------------------------         PCT < 0.25 ng/mL  PCT < 0.1 ng/mL     Stopping of antibiotics            Stopping of antibiotics       strongly encouraged.               strongly encouraged.    ----------------------------     ------------------------------       PCT level decrease by               PCT < 0.25 ng/mL       >= 80% from peak PCT       OR PCT 0.25 - 0.5 ng/mL          Stopping of antibiotics                                             encouraged.     Stopping of antibiotics           encouraged.    ----------------------------     ------------------------------       PCT level decrease by              PCT >= 0.25 ng/mL       < 80% from peak PCT        AND PCT >= 0.5 ng/mL             Continuing antibiotics                                              encouraged.       Continuing antibiotics            encouraged.    ----------------------------     ------------------------------     PCT level increase compared          PCT > 0.5 ng/mL         with peak PCT AND          PCT >= 0.5 ng/mL             Escalation of antibiotics                                          strongly encouraged.      Escalation of antibiotics        strongly encouraged.   Protime-INR     Status: Abnormal   Collection Time: 09/03/2015  6:18 PM  Result Value Ref Range   Prothrombin Time 26.9 (H) 11.6 - 15.2 seconds   INR 2.64 (H) 0.00 - 1.49  APTT     Status: Abnormal   Collection Time: 08/29/2015  6:18 PM  Result Value Ref Range   aPTT 48 (H) 24 - 37 seconds    Comment:        IF BASELINE aPTT IS ELEVATED, SUGGEST PATIENT RISK ASSESSMENT BE USED TO DETERMINE APPROPRIATE ANTICOAGULANT THERAPY.   MRSA PCR Screening     Status: None   Collection Time: 08/15/2015  6:19 PM  Result Value Ref Range   MRSA by PCR NEGATIVE NEGATIVE     Comment:        The GeneXpert MRSA Assay (FDA approved for  NASAL specimens only), is one component of a comprehensive MRSA colonization surveillance program. It is not intended to diagnose MRSA infection nor to guide or monitor treatment for MRSA infections.   Magnesium     Status: Abnormal   Collection Time: 08/17/2015  6:35 PM  Result Value Ref Range   Magnesium 1.6 (L) 1.7 - 2.4 mg/dL  Comprehensive metabolic panel     Status: Abnormal   Collection Time: 08/17/15  3:34 AM  Result Value Ref Range   Sodium 138 135 - 145 mmol/L   Potassium 4.1 3.5 - 5.1 mmol/L    Comment: RESULT REPEATED AND VERIFIED DELTA CHECK NOTED NO VISIBLE HEMOLYSIS    Chloride 112 (H) 101 - 111 mmol/L   CO2 20 (L) 22 - 32 mmol/L   Glucose, Bld 117 (H) 65 - 99 mg/dL   BUN 34 (H) 6 - 20 mg/dL   Creatinine, Ser 0.86 0.44 - 1.00 mg/dL   Calcium 7.1 (L) 8.9 - 10.3 mg/dL   Total Protein 5.4 (L) 6.5 - 8.1 g/dL   Albumin 2.2 (L) 3.5 - 5.0 g/dL   AST 61 (H) 15 - 41 U/L   ALT 33 14 - 54 U/L   Alkaline Phosphatase 73 38 - 126 U/L   Total Bilirubin 0.5 0.3 - 1.2 mg/dL   GFR calc non Af Amer >60 >60 mL/min   GFR calc Af Amer >60 >60 mL/min    Comment: (NOTE) The eGFR has been calculated using the CKD EPI equation. This calculation has not been validated in all clinical situations. eGFR's persistently <60 mL/min signify possible Chronic Kidney Disease.    Anion gap 6 5 - 15  CBC WITH DIFFERENTIAL     Status: Abnormal   Collection Time: 08/17/15  3:34 AM  Result Value Ref Range   WBC 3.5 (L) 4.0 - 10.5 K/uL   RBC 3.53 (L) 3.87 - 5.11 MIL/uL   Hemoglobin 10.6 (L) 12.0 - 15.0 g/dL   HCT 32.5 (L) 36.0 - 46.0 %   MCV 92.1 78.0 - 100.0 fL   MCH 30.0 26.0 - 34.0 pg   MCHC 32.6 30.0 - 36.0 g/dL   RDW 14.7 11.5 - 15.5 %   Platelets 68 (L) 150 - 400 K/uL    Comment: CONSISTENT WITH PREVIOUS RESULT   Neutrophils Relative % 86 %   Neutro Abs 3.1 1.7 - 7.7 K/uL   Lymphocytes Relative 9 %   Lymphs Abs 0.3  (L) 0.7 - 4.0 K/uL   Monocytes Relative 5 %   Monocytes Absolute 0.2 0.1 - 1.0 K/uL   Eosinophils Relative 0 %   Eosinophils Absolute 0.0 0.0 - 0.7 K/uL   Basophils Relative 0 %   Basophils Absolute 0.0 0.0 - 0.1 K/uL  Glucose, capillary     Status: Abnormal   Collection Time: 08/17/15  8:00 AM  Result Value Ref Range   Glucose-Capillary 110 (H) 65 - 99 mg/dL   Comment 1 Notify RN    Comment 2 Document in Chart   Culture, blood (Routine X 2) w Reflex to ID Panel     Status: None (Preliminary result)   Collection Time: 08/17/15  2:20 PM  Result Value Ref Range   Specimen Description BLOOD RIGHT HAND    Special Requests IN PEDIATRIC BOTTLE 4CC    Culture PENDING    Report Status PENDING    '@BRIEFLABTABLE'$ (sdes,specrequest,cult,reptstatus)   ) Recent Results (from the past 720 hour(s))  Culture, blood (Routine x 2)  Status: None (Preliminary result)   Collection Time: 09/05/2015  1:10 PM  Result Value Ref Range Status   Specimen Description BLOOD LEFT ANTECUBITAL  Final   Special Requests BOTTLES DRAWN AEROBIC AND ANAEROBIC 1 CC EACH  Final   Culture  Setup Time   Final    IN BOTH AEROBIC AND ANAEROBIC BOTTLES GRAM POSITIVE COCCI IN CLUSTERS CRITICAL RESULT CALLED TO, READ BACK BY AND VERIFIED WITH: N GLOGOVAC,PHARMD AT 9794 08/17/15 BY L BENFIELD    Culture   Final    NO GROWTH < 24 HOURS Performed at [facility]    Report Status PENDING  Incomplete  Blood Culture ID Panel (Reflexed)     Status: Abnormal   Collection Time: 08/25/2015  1:10 PM  Result Value Ref Range Status   Enterococcus species NOT DETECTED NOT DETECTED Final   Vancomycin resistance NOT DETECTED NOT DETECTED Final   Listeria monocytogenes NOT DETECTED NOT DETECTED Final   Staphylococcus species DETECTED (A) NOT DETECTED Final    Comment: CRITICAL RESULT CALLED TO, READ BACK BY AND VERIFIED WITH: N GLOGOVAC,PHARMD AT 0735 08/17/15 BY L BENFIELD    Staphylococcus aureus DETECTED (A) NOT DETECTED  Final    Comment: CRITICAL RESULT CALLED TO, READ BACK BY AND VERIFIED WITH: N GLOGOVAC,PHARMD AT 0735 08/17/15 BY L BENFIELD    Methicillin resistance NOT DETECTED NOT DETECTED Final   Streptococcus species NOT DETECTED NOT DETECTED Final   Streptococcus agalactiae NOT DETECTED NOT DETECTED Final   Streptococcus pneumoniae NOT DETECTED NOT DETECTED Final   Streptococcus pyogenes NOT DETECTED NOT DETECTED Final   Acinetobacter baumannii NOT DETECTED NOT DETECTED Final   Enterobacteriaceae species NOT DETECTED NOT DETECTED Final   Enterobacter cloacae complex NOT DETECTED NOT DETECTED Final   Escherichia coli NOT DETECTED NOT DETECTED Final   Klebsiella oxytoca NOT DETECTED NOT DETECTED Final   Klebsiella pneumoniae NOT DETECTED NOT DETECTED Final   Proteus species NOT DETECTED NOT DETECTED Final   Serratia marcescens NOT DETECTED NOT DETECTED Final   Carbapenem resistance NOT DETECTED NOT DETECTED Final   Haemophilus influenzae NOT DETECTED NOT DETECTED Final   Neisseria meningitidis NOT DETECTED NOT DETECTED Final   Pseudomonas aeruginosa NOT DETECTED NOT DETECTED Final   Candida albicans NOT DETECTED NOT DETECTED Final   Candida glabrata NOT DETECTED NOT DETECTED Final   Candida krusei NOT DETECTED NOT DETECTED Final   Candida parapsilosis NOT DETECTED NOT DETECTED Final   Candida tropicalis NOT DETECTED NOT DETECTED Final    Comment: Performed at [facility]  Culture, blood (Routine x 2)     Status: None (Preliminary result)   Collection Time: 09/03/2015  1:50 PM  Result Value Ref Range Status   Specimen Description BLOOD RIGHT HAND  Final   Special Requests IN PEDIATRIC BOTTLE 3 CC  Final   Culture  Setup Time   Final    PED BOTTLE GRAM POSITIVE COCCI IN CLUSTERS CRITICAL RESULT CALLED TO, READ BACK BY AND VERIFIED WITH: N GLOGOVAC,PARMD AT 0735 08/17/15 BY L BENFIELD    Culture   Final    NO GROWTH < 24 HOURS Performed at [facility]    Report Status PENDING   Incomplete  Urine culture     Status: Abnormal   Collection Time: 08/21/2015  3:19 PM  Result Value Ref Range Status   Specimen Description URINE, CATHETERIZED  Final   Special Requests NONE  Final   Culture (A)  Final    <10,000 COLONIES/mL INSIGNIFICANT  GROWTH Performed at [facility]    Report Status 08/17/2015 FINAL  Final  MRSA PCR Screening     Status: None   Collection Time: 08/26/2015  6:19 PM  Result Value Ref Range Status   MRSA by PCR NEGATIVE NEGATIVE Final    Comment:        The GeneXpert MRSA Assay (FDA approved for NASAL specimens only), is one component of a comprehensive MRSA colonization surveillance program. It is not intended to diagnose MRSA infection nor to guide or monitor treatment for MRSA infections.   Culture, blood (Routine X 2) w Reflex to ID Panel     Status: None (Preliminary result)   Collection Time: 08/17/15  2:20 PM  Result Value Ref Range Status   Specimen Description BLOOD RIGHT HAND  Final   Special Requests IN PEDIATRIC BOTTLE 4CC  Final   Culture PENDING  Incomplete   Report Status PENDING  Incomplete     Impression/Recommendation  Principal Problem:   Septic shock due to urinary tract infection ([place]) Active Problems:   Peripheral neuropathy (HCC)   Chronic back pain   Dyslipidemia   GERD (gastroesophageal reflux disease)   Constipation   Depression   Vascular dementia without behavioral disturbance   Hypotension   H/O: CVA (cerebrovascular accident)   AKI (acute kidney injury) ([place])   Atrial fibrillation with rapid ventricular response (HCC)   Hypokalemia   UTI (lower urinary tract infection)   Acute encephalopathy   Dehydration   Bacteremia: Probable   Staphylococcus aureus bacteremia with sepsis (HCC)   Thrombocytopenia (HCC)   Oral thrush   Dorothy Collins is a 82 y.o. female with  Staphylococcus aureus bacteremia and sepsis  #1 SAB with sepsis       [place] [facility]icrobial Management Team  Staphylococcus aureus bacteremia   Staphylococcus aureus bacteremia (SAB) is associated with a high rate of complications and mortality.  Specific aspects of clinical management are critical to optimizing the outcome of patients with SAB.  Therefore, the [facility] Antimicrobial Management Team [facility]) has initiated an intervention aimed at improving the management of SAB at [facility].  To do so, Infectious Diseases physicians are providing an evidence-based consult for the management of all patients with SAB.     Yes No Comments  Perform follow-up blood cultures (even if the patient is afebrile) to ensure clearance of bacteremia '[x]'$  '[]'$  To be collected today once we are beyond 24 hours  Remove vascular catheter and obtain follow-up blood cultures after the removal of the catheter '[]'$  '[]'$  DO NOT PLACE A CENTRAL LINE UNTIL TODAYS CULTURES ARE NO GROWTH X 4-5 DAYS  Perform echocardiography to evaluate for endocarditis (transthoracic ECHO is 40-50% sensitive, TEE is > 90% sensitive) '[]'$  '[]'$  Please keep in mind, that neither test can definitively EXCLUDE endocarditis, and that should clinical suspicion remain high for endocarditis the patient should then still be treated with an "endocarditis" duration of therapy = 6 weeks    Consult electrophysiologist to evaluate implanted cardiac device (pacemaker, ICD) '[]'$  '[]'$  TTE ordered  Ensure source control '[]'$  '[]'$  Have all abscesses been drained effectively? Have deep seeded infections (septic joints or osteomyelitis) had appropriate surgical debridement?  Not clear what the source is?   Investigate for "metastatic" sites of infection '[]'$  '[]'$  Does the patient have ANY symptom or physical exam finding that would suggest a deeper infection (back or neck pain that may be suggestive of vertebral osteomyelitis or epidural abscess, muscle pain that could  be a symptom of pyomyositis)?  Keep in mind that for deep seeded infections MRI imaging with contrast is preferred rather  than other often insensitive tests such as plain x-rays, especially early in a patient's presentation.  He TKA site is benign  Change antibiotic therapy to cefazolin 2 grams IV q 8 hours '[]'$  '[]'$  Beta-lactam antibiotics are preferred for MSSA due to higher cure rates.   If on Vancomycin, goal trough should be 15 - 20 mcg/mL  Estimated duration of IV antibiotic therapy:  4-6 weeks since this is NOT an uncomplicated SAB. No clear defined removable source of infection.  '[]'$  '[]'$  Consult case management for probably prolonged outpatient IV antibiotic therapy   Dr. Megan Salon to take over the service tomorrow.    08/17/2015, 5:09 PM   Thank you so much for this interesting consult  [place] [facility] [place] [phone] (pager) [phone] (office) 08/17/2015, 5:09 PM  Rhina Brackett [place] 08/17/2015, 5:09 PM

## 2015-08-17 NOTE — Progress Notes (Addendum)
Pharmacy Antibiotic Note  Dorothy Collins is a 82 y.o. female from [facility] who presented to [facility] ED on 08/19/2015 with failure to thrive, increased lethargy x 2 days. Patient tachycardic, hypotensive, and febrile in the ED. Code sepsis initiated. Patient received doses of Vancomycin, Levaquin, and Aztreonam in the ED. Upon admission, antibiotics changed to Vancomycin and Cefepime for septic shock secondary to UTI.  This morning, BCID resulted with MSSA.  ID consulted and narrowing to cefazolin.  Plan: Cefazolin 2g IV q8h.  Height: 5\' 7"  (170.2 cm) Weight: 151 lb 7.3 oz (68.7 kg) IBW/kg (Calculated) : 61.6  Temp (24hrs), Avg:100.7 F (38.2 C), Min:99.3 F (37.4 C), Max:104 F (40 C)   Recent Labs Lab 09/05/2015 1255 09/03/2015 1322 08/23/2015 1543 08/23/2015 1556 08/17/15 0334  WBC 7.5  --   --   --  3.5*  CREATININE  --   --  1.30*  --  0.86  LATICACIDVEN  --  2.74*  --  1.37  --     Estimated Creatinine Clearance: 49 mL/min (by C-G formula based on Cr of 0.86).    Allergies  Allergen Reactions  . Lyrica [Pregabalin]     Unknown; listed on MAR  . Morphine And Related     Unknown; listed on MAR  . Penicillins     Unknown; listed on MAR  . Sulfa Antibiotics     Unknown; listed on mar    Antimicrobials this admission:  6/3 >> Levaquin x 1  6/3 >> Aztreonam x 1  6/3 >> Vancomycin >> 6/4 6/3 >> Cefepime >> 6/4 6/4 >> Cefazolin >>  Dose adjustments this admission:  --  Microbiology results:  6/3 BCx: GPC in clusters; BCID staph aureus (No methicillin resistance)  6/3 UCx: sent  6/3 MRSA PCR: neg  Thank you for allowing pharmacy to be a part of this patient's care.  Hershal Coria, PharmD, BCPS Pager: [phone] 08/17/2015 10:01 AM

## 2015-08-17 NOTE — Evaluation (Signed)
Physical Therapy Evaluation Patient Details Name: Dorothy Collins MRN: [id number] DOB: [birth year] Today's Date: 08/17/2015   History of Present Illness  Patient is a 82 year old female nursing home resident with history of atrial fibrillation, CVA, recurrent UTIs, dementia, depression, who presents to the ED 09/06/2015 in septic shock secondary to UTI and staph aureus bacteremia.   Clinical Impression  The patient is pleasant and alert. Not oriented to time and situation. She reports that she uses a scooter to get around. Level of assistance PTA is unknown. Patient is long term resident of [facility].  Pt admitted with above diagnosis. Pt currently with functional limitations due to the deficits listed below (see PT Problem List). Pt will benefit from skilled PT to increase their independence and safety with mobility to allow discharge to the venue listed below.       Follow Up Recommendations SNF (if patient is a long term resident and required assistance , skilled PT may not be indicated. Need more information about prior level of care. Daughter lives in [place] )    Equipment Recommendations  None recommended by PT    Recommendations for Other Services       Precautions / Restrictions Precautions Precautions: Fall Precaution Comments: heels are reddened Restrictions Weight Bearing Restrictions: No      Mobility  Bed Mobility Overal bed mobility: Needs Assistance Bed Mobility: Supine to Sit;Sit to Supine     Supine to sit: HOB elevated;Max assist;Mod assist Sit to supine: Max assist   General bed mobility comments: patient assisting with legs to the edge, assist wit trunk to upright and use of bed pad to slide to the edge. Patient lifte Left leg onto bed, assist right and trunk to supine, HR varying 110-127, BP 163/67, SAts  >93% 2 liters.  Transfers                 General transfer comment: did not attempt  Ambulation/Gait                Stairs             Wheelchair Mobility    Modified Rankin (Stroke Patients Only)       Balance Overall balance assessment: Needs assistance Sitting-balance support: Bilateral upper extremity supported;Feet supported Sitting balance-Leahy Scale: Poor Sitting balance - Comments: sat with significant kyphosis and list to the right, head tilts to the right.                                     Pertinent Vitals/Pain Pain Assessment: Faces Faces Pain Scale: Hurts even more Pain Location: reports mouth sores, removed dentures and rinsed them and mouth and replaced them Pain Descriptors / Indicators: Discomfort;Grimacing;Sore Pain Intervention(s): Limited activity within patient's tolerance;Monitored during session    Home Living Family/patient expects to be discharged to:: Skilled nursing facility                      Prior Function Level of Independence: Needs assistance   Gait / Transfers Assistance Needed: non ambulatory, per patient she uses a scooter. Levle of assistab=nce for transfer unknown           Hand Dominance        Extremity/Trunk Assessment   Upper Extremity Assessment: Generalized weakness;RUE deficits/detail;LUE deficits/detail RUE Deficits / Details: decreased  ability to take cup to mouth, does hold it. able toreach mouth once  HOB raised     LUE Deficits / Details: similar to right,    Lower Extremity Assessment: RLE deficits/detail;LLE deficits/detail RLE Deficits / Details: able to dorsiflex to neutral and lift leg from bed LLE Deficits / Details: decreased dorsiflexion/less than neutral, able  to lift leg  frommbed.      Communication   Communication:  (speech is somewhat slurred. She asked" When will my speech get better.)  Cognition Arousal/Alertness: Awake/alert Behavior During Therapy: WFL for tasks assessed/performed Overall Cognitive Status: History of cognitive impairments - at baseline       Memory: Decreased recall of  precautions;Decreased short-term memory              General Comments      Exercises        Assessment/Plan    PT Assessment Patient needs continued PT services  PT Diagnosis Generalized weakness;Altered mental status   PT Problem List Decreased strength;Decreased range of motion;Decreased activity tolerance;Decreased balance;Decreased mobility;Decreased cognition;Decreased knowledge of precautions;Cardiopulmonary status limiting activity  PT Treatment Interventions Functional mobility training;Therapeutic activities;Balance training;Patient/family education   PT Goals (Current goals can be found in the Care Plan section) Acute Rehab PT Goals Patient Stated Goal: none stated PT Goal Formulation: Patient unable to participate in goal setting Time For Goal Achievement: 08/31/15 Potential to Achieve Goals: Fair    Frequency Min 2X/week   Barriers to discharge        Co-evaluation               End of Session   Activity Tolerance: Patient limited by fatigue Patient left: in bed;with call bell/phone within reach;with bed alarm set Nurse Communication: Mobility status         Time: [id number] PT Time Calculation (min) (ACUTE ONLY): 37 min   Charges:   PT Evaluation $PT Eval Low Complexity: 1 Procedure PT Treatments $Therapeutic Activity: 8-22 mins   PT G Codes:        Marcelino Freestone PT [id number]  08/17/2015, 10:04 AM

## 2015-08-17 NOTE — Progress Notes (Signed)
Unable to obtain consent.  No answer from numbers provided by daughter. Will attempt to contact later.

## 2015-08-18 ENCOUNTER — Encounter (HOSPITAL_COMMUNITY): Payer: Self-pay | Admitting: Cardiology

## 2015-08-18 ENCOUNTER — Inpatient Hospital Stay (HOSPITAL_COMMUNITY): Payer: Medicare Other

## 2015-08-18 DIAGNOSIS — I4891 Unspecified atrial fibrillation: Secondary | ICD-10-CM

## 2015-08-18 DIAGNOSIS — R401 Stupor: Secondary | ICD-10-CM

## 2015-08-18 DIAGNOSIS — E876 Hypokalemia: Secondary | ICD-10-CM

## 2015-08-18 DIAGNOSIS — Z96651 Presence of right artificial knee joint: Secondary | ICD-10-CM

## 2015-08-18 DIAGNOSIS — D696 Thrombocytopenia, unspecified: Secondary | ICD-10-CM

## 2015-08-18 DIAGNOSIS — R7881 Bacteremia: Secondary | ICD-10-CM

## 2015-08-18 DIAGNOSIS — A4901 Methicillin susceptible Staphylococcus aureus infection, unspecified site: Secondary | ICD-10-CM

## 2015-08-18 DIAGNOSIS — R509 Fever, unspecified: Secondary | ICD-10-CM

## 2015-08-18 LAB — CBC WITH DIFFERENTIAL/PLATELET
BASOS ABS: 0 10*3/uL (ref 0.0–0.1)
BASOS PCT: 0 %
EOS ABS: 0 10*3/uL (ref 0.0–0.7)
Eosinophils Relative: 0 %
HEMATOCRIT: 30.8 % — AB (ref 36.0–46.0)
HEMOGLOBIN: 10.1 g/dL — AB (ref 12.0–15.0)
Lymphocytes Relative: 10 %
Lymphs Abs: 0.5 10*3/uL — ABNORMAL LOW (ref 0.7–4.0)
MCH: 29.1 pg (ref 26.0–34.0)
MCHC: 32.8 g/dL (ref 30.0–36.0)
MCV: 88.8 fL (ref 78.0–100.0)
Monocytes Absolute: 0.4 10*3/uL (ref 0.1–1.0)
Monocytes Relative: 8 %
NEUTROS ABS: 4.1 10*3/uL (ref 1.7–7.7)
NEUTROS PCT: 82 %
Platelets: 72 10*3/uL — ABNORMAL LOW (ref 150–400)
RBC: 3.47 MIL/uL — ABNORMAL LOW (ref 3.87–5.11)
RDW: 14.4 % (ref 11.5–15.5)
WBC: 5 10*3/uL (ref 4.0–10.5)

## 2015-08-18 LAB — COMPREHENSIVE METABOLIC PANEL
ALK PHOS: 75 U/L (ref 38–126)
ALT: 32 U/L (ref 14–54)
ANION GAP: 6 (ref 5–15)
AST: 44 U/L — ABNORMAL HIGH (ref 15–41)
Albumin: 2.3 g/dL — ABNORMAL LOW (ref 3.5–5.0)
BILIRUBIN TOTAL: 0.4 mg/dL (ref 0.3–1.2)
BUN: 22 mg/dL — ABNORMAL HIGH (ref 6–20)
CALCIUM: 7.7 mg/dL — AB (ref 8.9–10.3)
CO2: 20 mmol/L — ABNORMAL LOW (ref 22–32)
CREATININE: 0.59 mg/dL (ref 0.44–1.00)
Chloride: 111 mmol/L (ref 101–111)
Glucose, Bld: 156 mg/dL — ABNORMAL HIGH (ref 65–99)
Potassium: 3.8 mmol/L (ref 3.5–5.1)
SODIUM: 137 mmol/L (ref 135–145)
TOTAL PROTEIN: 5.5 g/dL — AB (ref 6.5–8.1)

## 2015-08-18 LAB — ECHOCARDIOGRAM COMPLETE
CHL CUP MV DEC (S): 169
E decel time: 169 msec
EERAT: 11.6
FS: 16 % — AB (ref 28–44)
Height: 67 in
IVS/LV PW RATIO, ED: 0.85
LA ID, A-P, ES: 36 cm
LA diam end sys: 36 cm
LA diam index: 1.97 cm/m2
LA vol A4C: 37 ml
LAVOL: 50.5 cm3
LAVOLIN: 27.6 mL/m2
LV E/e'average: 11.6
LV PW d: 12.5 mm — AB (ref 0.6–1.1)
LVEEMED: 11.6
LVELAT: 9.57 cm/s
LVOT area: 3.14 cm2
LVOTD: 20 cm
MV Peak grad: 5 mmHg
MVPKEVEL: 111 m/s
RV TAPSE: 15.7 cm
TDI e' lateral: 9.57
TDI e' medial: 7.72
Weight: 2522.06 oz

## 2015-08-18 LAB — C-REACTIVE PROTEIN: CRP: 13.1 mg/dL — AB (ref <1.0)

## 2015-08-18 LAB — GLUCOSE, CAPILLARY
GLUCOSE-CAPILLARY: 155 mg/dL — AB (ref 65–99)
Glucose-Capillary: 159 mg/dL — ABNORMAL HIGH (ref 65–99)

## 2015-08-18 LAB — SEDIMENTATION RATE: SED RATE: 22 mm/h (ref 0–22)

## 2015-08-18 LAB — MAGNESIUM: Magnesium: 2.3 mg/dL (ref 1.7–2.4)

## 2015-08-18 MED ORDER — HYDROCORTISONE NA SUCCINATE PF 100 MG IJ SOLR
50.0000 mg | Freq: Two times a day (BID) | INTRAMUSCULAR | Status: DC
  Administered 2015-08-18 – 2015-08-19 (×2): 50 mg via INTRAVENOUS
  Filled 2015-08-18 (×2): qty 2

## 2015-08-18 MED ORDER — METOPROLOL TARTRATE 12.5 MG HALF TABLET
12.5000 mg | ORAL_TABLET | Freq: Two times a day (BID) | ORAL | Status: DC
  Administered 2015-08-18 – 2015-08-19 (×3): 12.5 mg via ORAL
  Filled 2015-08-18 (×3): qty 1

## 2015-08-18 MED ORDER — HYDRALAZINE HCL 20 MG/ML IJ SOLN
10.0000 mg | Freq: Four times a day (QID) | INTRAMUSCULAR | Status: DC | PRN
  Administered 2015-08-18 – 2015-08-20 (×3): 10 mg via INTRAVENOUS
  Filled 2015-08-18 (×3): qty 1

## 2015-08-18 MED ORDER — FAMOTIDINE 20 MG PO TABS
20.0000 mg | ORAL_TABLET | Freq: Every day | ORAL | Status: DC
  Administered 2015-08-18 – 2015-08-19 (×2): 20 mg via ORAL
  Filled 2015-08-18 (×2): qty 1

## 2015-08-18 NOTE — Progress Notes (Signed)
Patient ID: Dorothy Collins, female   DOB: 1931/01/27, 82 y.o.   MRN: [id number]         [facility] for [facility]ious [facility]    Date of Admission:  09/07/2015    Total days of antibiotics 3        Day  2 cefazolin         Principal Problem:   Staphylococcus aureus bacteremia with sepsis (HCC) Active Problems:   Peripheral neuropathy (HCC)   Chronic back pain   Dyslipidemia   GERD (gastroesophageal reflux disease)   Constipation   Depression   Vascular dementia without behavioral disturbance   Hypotension   H/O: CVA (cerebrovascular accident)   AKI (acute kidney injury) ([place])   Atrial fibrillation with rapid ventricular response (HCC)   Hypokalemia   UTI (lower urinary tract infection)   Acute encephalopathy   Dehydration   Bacteremia: Probable   Thrombocytopenia (HCC)   Oral thrush   Obtundation   . amiodarone  400 mg Oral BID  . antiseptic oral rinse  7 mL Mouth Rinse BID  . atorvastatin  20 mg Oral Daily  . budesonide (PULMICORT) nebulizer solution  0.25 mg Nebulization BID  . calcium-vitamin D  1 tablet Oral BID  .  ceFAZolin (ANCEF) IV  2 g Intravenous Q8H  . cholecalciferol  2,000 Units Oral Daily  . cycloSPORINE  1 drop Both Eyes BID  . DULoxetine  60 mg Oral Daily  . famotidine  20 mg Oral Q2000  . feeding supplement (PRO-STAT SUGAR FREE 64)  30 mL Oral BID  . fluconazole  100 mg Oral Daily  . hydrocortisone sod succinate (SOLU-CORTEF) inj  50 mg Intravenous Q12H  . lactose free nutrition  237 mL Oral TID WC  . lamoTRIgine  150 mg Oral BID  . latanoprost  1 drop Right Eye QHS  . loratadine  10 mg Oral Daily  . memantine  5 mg Oral BID  . omega-3 acid ethyl esters  2 g Oral BID  . senna  1 tablet Oral BID  . sodium chloride flush  3 mL Intravenous Q12H    SUBJECTIVE: She responded "I would have to be" when I asked her if she was feeling better.  Review of Systems: Review of Systems  Constitutional: Negative for fever, chills and diaphoresis.    Respiratory: Negative for cough and shortness of breath.   Cardiovascular: Negative for chest pain.  Gastrointestinal: Positive for abdominal pain. Negative for nausea, vomiting and diarrhea.  Musculoskeletal: Negative for joint pain.    Past Medical History  Diagnosis Date  . Dementia   . CVA (cerebral vascular accident) ([place])   . COPD (chronic obstructive pulmonary disease) (HCC)     Emphysema  . GERD (gastroesophageal reflux disease)   . Glaucoma   . Hyperlipidemia   . Insomnia   . Rhinitis 12/14/1999  . Depression 10/12/2009  . Cancer ([place]) 07/18/2008    Breast cancer-Left  . Hypertension   . Arthritis 07/18/2008    osteoporosis  . Peripheral neuropathy ([place]) 07/18/2008  . Cerebrovascular disease 08/05/2003    acute  . Lumbago 08/05/2003  . Edema 08/05/2003  . Cellulitis and abscess of leg 08/05/2003  . Peripheral vascular disease ([place]) 08/05/2003  . Anxiety 08/05/2003  . Hypopotassemia 08/05/2003  . Anemia     Social History  Substance Use Topics  . Smoking status: Former Smoker    Types: Cigarettes    Quit date: 03/16/1999  . Smokeless tobacco: None  .  Alcohol Use: No    History reviewed. No pertinent family history. Allergies  Allergen Reactions  . Lyrica [Pregabalin]     Unknown; listed on MAR  . Morphine And Related     Unknown; listed on MAR  . Penicillins     Unknown; listed on MAR  . Sulfa Antibiotics     Unknown; listed on mar    OBJECTIVE: Filed Vitals:   08/18/15 0700 08/18/15 0715 08/18/15 0730 08/18/15 0800  BP: 186/93 156/98 143/89 140/77  Pulse: 126 118 110 107  Temp: 97.5 F (36.4 C) 97.5 F (36.4 C) 97.3 F (36.3 C) 97.3 F (36.3 C)  TempSrc:      Resp: 21 19 20 13   Height:      Weight:      SpO2: 100% 99% 100% 100%   Body mass index is 24.68 kg/(m^2).  Physical Exam  Constitutional: She is oriented to person, place, and time.  She is alert a no distress.  Cardiovascular:  No murmur heard. Distant heart sounds. Irregularly  irregular rhythm.  Pulmonary/Chest: Effort normal and breath sounds normal. She has no wheezes. She has no rales.  Abdominal: Soft. She exhibits no distension. There is no tenderness.  Musculoskeletal: Normal range of motion. She exhibits no edema or tenderness.  There is a healed incision over her right knee from prior arthroplasty. Her right knee is slightly warmer than her left.  Neurological: She is alert and oriented to person, place, and time.  Skin:  Several excoriations on her face that do not appear infected.  Psychiatric: Mood and affect normal.    Lab Results Lab Results  Component Value Date   WBC 5.0 08/18/2015   HGB 10.1* 08/18/2015   HCT 30.8* 08/18/2015   MCV 88.8 08/18/2015   PLT 72* 08/18/2015    Lab Results  Component Value Date   CREATININE 0.59 08/18/2015   BUN 22* 08/18/2015   NA 137 08/18/2015   K 3.8 08/18/2015   CL 111 08/18/2015   CO2 20* 08/18/2015    Lab Results  Component Value Date   ALT 32 08/18/2015   AST 44* 08/18/2015   ALKPHOS 75 08/18/2015   BILITOT 0.4 08/18/2015     Microbiology: Recent Results (from the past 240 hour(s))  Culture, blood (Routine x 2)     Status: Abnormal (Preliminary result)   Collection Time: 08/29/2015  1:10 PM  Result Value Ref Range Status   Specimen Description BLOOD LEFT ANTECUBITAL  Final   Special Requests BOTTLES DRAWN AEROBIC AND ANAEROBIC 1 CC EACH  Final   Culture  Setup Time   Final    IN BOTH AEROBIC AND ANAEROBIC BOTTLES GRAM POSITIVE COCCI IN CLUSTERS CRITICAL RESULT CALLED TO, READ BACK BY AND VERIFIED WITH: N GLOGOVAC,PHARMD AT 0735 08/17/15 BY L BENFIELD Performed at [facility] (A)  Final   Report Status PENDING  Incomplete  Blood Culture ID Panel (Reflexed)     Status: Abnormal   Collection Time: 09/05/2015  1:10 PM  Result Value Ref Range Status   Enterococcus species NOT DETECTED NOT DETECTED Final   Vancomycin resistance NOT DETECTED NOT DETECTED  Final   Listeria monocytogenes NOT DETECTED NOT DETECTED Final   Staphylococcus species DETECTED (A) NOT DETECTED Final    Comment: CRITICAL RESULT CALLED TO, READ BACK BY AND VERIFIED WITH: N GLOGOVAC,PHARMD AT [id number] 08/17/15 BY L BENFIELD    Staphylococcus aureus DETECTED (A) NOT DETECTED Final  Comment: CRITICAL RESULT CALLED TO, READ BACK BY AND VERIFIED WITH: N GLOGOVAC,PHARMD AT 0735 08/17/15 BY L BENFIELD    Methicillin resistance NOT DETECTED NOT DETECTED Final   Streptococcus species NOT DETECTED NOT DETECTED Final   Streptococcus agalactiae NOT DETECTED NOT DETECTED Final   Streptococcus pneumoniae NOT DETECTED NOT DETECTED Final   Streptococcus pyogenes NOT DETECTED NOT DETECTED Final   Acinetobacter baumannii NOT DETECTED NOT DETECTED Final   Enterobacteriaceae species NOT DETECTED NOT DETECTED Final   Enterobacter cloacae complex NOT DETECTED NOT DETECTED Final   Escherichia coli NOT DETECTED NOT DETECTED Final   Klebsiella oxytoca NOT DETECTED NOT DETECTED Final   Klebsiella pneumoniae NOT DETECTED NOT DETECTED Final   Proteus species NOT DETECTED NOT DETECTED Final   Serratia marcescens NOT DETECTED NOT DETECTED Final   Carbapenem resistance NOT DETECTED NOT DETECTED Final   Haemophilus influenzae NOT DETECTED NOT DETECTED Final   Neisseria meningitidis NOT DETECTED NOT DETECTED Final   Pseudomonas aeruginosa NOT DETECTED NOT DETECTED Final   Candida albicans NOT DETECTED NOT DETECTED Final   Candida glabrata NOT DETECTED NOT DETECTED Final   Candida krusei NOT DETECTED NOT DETECTED Final   Candida parapsilosis NOT DETECTED NOT DETECTED Final   Candida tropicalis NOT DETECTED NOT DETECTED Final    Comment: Performed at [facility]  Culture, blood (Routine x 2)     Status: None (Preliminary result)   Collection Time: 08/30/2015  1:50 PM  Result Value Ref Range Status   Specimen Description BLOOD RIGHT HAND  Final   Special Requests IN PEDIATRIC BOTTLE 3 CC  Final    Culture  Setup Time   Final    PED BOTTLE GRAM POSITIVE COCCI IN CLUSTERS CRITICAL RESULT CALLED TO, READ BACK BY AND VERIFIED WITH: N GLOGOVAC,PARMD AT 0735 08/17/15 BY L BENFIELD Performed at [facility]  Final   Report Status PENDING  Incomplete  Urine culture     Status: Abnormal   Collection Time: 08/25/2015  3:19 PM  Result Value Ref Range Status   Specimen Description URINE, CATHETERIZED  Final   Special Requests NONE  Final   Culture (A)  Final    <10,000 COLONIES/mL INSIGNIFICANT GROWTH Performed at [facility]    Report Status 08/17/2015 FINAL  Final  MRSA PCR Screening     Status: None   Collection Time: 08/19/2015  6:19 PM  Result Value Ref Range Status   MRSA by PCR NEGATIVE NEGATIVE Final    Comment:        The GeneXpert MRSA Assay (FDA approved for NASAL specimens only), is one component of a comprehensive MRSA colonization surveillance program. It is not intended to diagnose MRSA infection nor to guide or monitor treatment for MRSA infections.   Culture, blood (Routine X 2) w Reflex to ID Panel     Status: None (Preliminary result)   Collection Time: 08/17/15  2:20 PM  Result Value Ref Range Status   Specimen Description BLOOD RIGHT HAND  Final   Special Requests IN PEDIATRIC BOTTLE 4CC  Final   Culture PENDING  Incomplete   Report Status PENDING  Incomplete     ASSESSMENT: Ms. Cerio is improving on therapy for MSSA bacteremia. She has defervesced and is feeling better. The source of her bacteremia is not entirely clear. It is possible that it came from a UTI although her urine cultures are negative. I do not believe that her right knee prosthetic  joint is infected. I will continue cefazolin pending repeat blood cultures and a transthoracic echocardiogram.I will hold off on PICC placement until we know repeat blood cultures are negative.  PLAN: 1. Continue cefazolin 2. Await results of repeat blood cultures and  transthoracic echocardiogram  Michel Bickers, MD [facility] for [place] [phone] pager   [phone] cell 08/18/2015, 9:56 AM

## 2015-08-18 NOTE — Consult Note (Signed)
Patient ID: Dorothy Collins MRN: [id number], DOB/AGE: Jan 08, 1931   Admit date: 09/11/2015   Primary Physician: Wenda Low, MD Primary Cardiologist: Dr. Oval Linsey   Reason for Consult: Bradycardia Requesting MD: Dr. Grandville Silos, Internal Medicine  Pt. Profile:  82 y/o female nursing home resident with h/o atrial fibrillation, stroke, carotid stenosis s/p L CEA, HLD and chronic anticoagulation with Xarelto, admitted by IM for septic shock secondary to UTI. Cardiology consulted for bradycardia.   Problem List  Past Medical History  Diagnosis Date  . Dementia   . CVA (cerebral vascular accident) ([place])   . COPD (chronic obstructive pulmonary disease) (HCC)     Emphysema  . GERD (gastroesophageal reflux disease)   . Glaucoma   . Hyperlipidemia   . Insomnia   . Rhinitis 12/14/1999  . Depression 10/12/2009  . Cancer ([place]) 07/18/2008    Breast cancer-Left  . Hypertension   . Arthritis 07/18/2008    osteoporosis  . Peripheral neuropathy ([place]) 07/18/2008  . Cerebrovascular disease 08/05/2003    acute  . Lumbago 08/05/2003  . Edema 08/05/2003  . Cellulitis and abscess of leg 08/05/2003  . Peripheral vascular disease ([place]) 08/05/2003  . Anxiety 08/05/2003  . Hypopotassemia 08/05/2003  . Anemia     Past Surgical History  Procedure Laterality Date  . Carotid endarterectomy  2011    Left CEA  . Eye surgery  07/18/2008    cataract  . Fracture surgery  07/18/2008    closed  part neck femur  . Joint replacement      rt knee  . Breast surgery      rt     Allergies  Allergies  Allergen Reactions  . Lyrica [Pregabalin]     Unknown; listed on MAR  . Morphine And Related     Unknown; listed on MAR  . Penicillins     Unknown; listed on MAR  . Sulfa Antibiotics     Unknown; listed on mar    HPI  82 y/o female nursing home resident with h/o atrial fibrillation, stroke, carotid stenosis s/p L CEA, HLD and chronic anticoagulation with Xarelto, admitted by IM for septic shock secondary to  UTI. Cardiology consulted for bradycardia. She is DNR.   She was recently seen by Dr. Oval Linsey, in clinic, on 08/14/15 for routine f/u. She was noted to be in afib with poorly controlled ventricular rates. There was little room in BP to increase her BB, thus decision was made ton initiate PO amiodarone. She was placed on 400 mg BID, with instructions to continue x 7 days, followed by 200 mg daily. She was instructed to continue Xarelto for a/c. Office f/u was arranged for 6/14 with Murray Hodgkins, NP.   She presented to the [facility] ED from nursing facility on 08/15/2015 with lethargy. BP on arrival to ED was 79/50. HR was 128. Temp was 100.8. Initial lactic acid was elevated a 2.7, however repeat lactic acid was 1.37. UA showed large leukocytes, nitrate negative. CXR was unremarkable. She was given IVFs and placed on IV antibiotics, Ancef, 2 g IV Q8h. She was placed on telemetry and was observed to have telemetry readings in the 30s.   She denies any CP or dyspnea. No palpitations. Her only complaint is feeling tired/ sleepy. Current HR in the 60s.   Home Medications  Prior to Admission medications   Medication Sig Start Date End Date Taking? Authorizing Provider  acetaminophen (TYLENOL) 650 MG CR tablet Take 650 mg by mouth every 4 (four) hours  as needed for pain or fever.   Yes Historical Provider, MD  Amino Acids-Protein Hydrolys (FEEDING SUPPLEMENT, PRO-STAT SUGAR FREE 64,) LIQD Take 30 mLs by mouth 2 (two) times daily.   Yes Historical Provider, MD  amiodarone (PACERONE) 200 MG tablet Take 200-400 mg by mouth as directed. Take 2 TABLETS BY MOUTH TWICE A DAY FOR 7 DAYS AND THEN 1 TABLET DAILY   Yes Historical Provider, MD  aspirin EC 81 MG tablet Take 81 mg by mouth daily.   Yes Historical Provider, MD  atorvastatin (LIPITOR) 20 MG tablet Take 20 mg by mouth daily.   Yes Historical Provider, MD  bimatoprost (LUMIGAN) 0.01 % SOLN Place 1 drop into the right eye at bedtime.   Yes Historical Provider, MD    budesonide-formoterol (SYMBICORT) 80-4.5 MCG/ACT inhaler Inhale 2 puffs into the lungs 2 (two) times daily. 06/06/15  Yes Florencia Reasons, MD  calcium-vitamin D (OSCAL WITH D) 500-200 MG-UNIT per tablet Take 1 tablet by mouth 2 (two) times daily.    Yes Historical Provider, MD  Cholecalciferol 2000 UNITS CAPS Take 2,000 Units by mouth daily.   Yes Historical Provider, MD  clonazePAM (KLONOPIN) 0.5 MG tablet Take 1 tablet (0.5 mg total) by mouth 3 (three) times daily as needed for anxiety. 09/07/13  Yes Kinnie Feil, MD  cycloSPORINE (RESTASIS) 0.05 % ophthalmic emulsion Place 1 drop into both eyes 2 (two) times daily.   Yes Historical Provider, MD  DULoxetine (CYMBALTA) 30 MG capsule Take 2 capsules (60 mg total) by mouth daily. Takes with 60 mg for total of 90mg  09/07/13  Yes Kinnie Feil, MD  fexofenadine (ALLEGRA) 180 MG tablet Take 180 mg by mouth daily.   Yes Historical Provider, MD  gabapentin (NEURONTIN) 400 MG capsule Take 1 capsule (400 mg total) by mouth 3 (three) times daily. 09/07/13  Yes Kinnie Feil, MD  ketotifen (ZADITOR) 0.025 % ophthalmic solution Place 1 drop into both eyes 2 (two) times daily. Wait 3-5 minutes between eye meds   Yes Historical Provider, MD  lamoTRIgine (LAMICTAL) 150 MG tablet Take 150 mg by mouth 2 (two) times daily.   Yes Historical Provider, MD  lidocaine (LIDODERM) 5 % Place 1 patch onto the skin daily. Apply to back  -  Remove & Discard patch within 12 hours or as directed by MD. On for 12 hours off for 12 hours   Yes Historical Provider, MD  meclizine (ANTIVERT) 25 MG tablet Take 25 mg by mouth every 6 (six) hours as needed for dizziness.    Yes Historical Provider, MD  memantine (NAMENDA) 5 MG tablet Take 5 mg by mouth 2 (two) times daily.   Yes Historical Provider, MD  metoprolol tartrate (LOPRESSOR) 25 MG tablet Take 12.5 mg by mouth 2 (two) times daily.   Yes Historical Provider, MD  Multiple Vitamins-Minerals (DECUBI-VITE PO) Take 1 tablet by mouth  daily.   Yes Historical Provider, MD  Nutritional Supplements (BOOST PLUS PO) Take 1 Can by mouth 3 (three) times daily with meals.   Yes Historical Provider, MD  omega-3 acid ethyl esters (LOVAZA) 1 G capsule Take 2 g by mouth 2 (two) times daily.   Yes Historical Provider, MD  omeprazole (PRILOSEC) 20 MG capsule Take 40 mg by mouth 2 (two) times daily before a meal.    Yes Historical Provider, MD  Polyethyl Glycol-Propyl Glycol (SYSTANE OP) Place 1 drop into both eyes 4 (four) times daily.    Yes Historical Provider, MD  polyethylene  glycol (MIRALAX / GLYCOLAX) packet Take 17 g by mouth daily.   Yes Historical Provider, MD  risperiDONE (RISPERDAL M-TABS) 1 MG disintegrating tablet Take 1 mg by mouth 2 (two) times daily.   Yes Historical Provider, MD  rivaroxaban (XARELTO) 20 MG TABS tablet Take 1 tablet (20 mg total) by mouth daily with supper. 01/27/15  Yes Skeet Latch, MD  senna (SENOKOT) 8.6 MG tablet Take 1 tablet by mouth 2 (two) times daily.    Yes Historical Provider, MD  traZODone (DESYREL) 50 MG tablet Take 50 mg by mouth at bedtime.   Yes Historical Provider, MD  umeclidinium bromide (INCRUSE ELLIPTA) 62.5 MCG/INH AEPB Inhale 1 puff into the lungs daily.   Yes Historical Provider, MD  ZOLMitriptan (ZOMIG) 2.5 MG tablet Take 2.5 mg by mouth as needed. For migraine   Yes Historical Provider, MD  doxycycline (VIBRAMYCIN) 100 MG capsule Take 1 capsule (100 mg total) by mouth 2 (two) times daily. Patient not taking: Reported on 08/14/2015 06/06/15   Florencia Reasons, MD  oseltamivir (TAMIFLU) 30 MG capsule Take 1 capsule (30 mg total) by mouth 2 (two) times daily. Patient not taking: Reported on 09/02/2015 06/06/15   Florencia Reasons, MD  tiotropium (SPIRIVA HANDIHALER) 18 MCG inhalation capsule Place 1 capsule (18 mcg total) into inhaler and inhale every morning. Patient not taking: Reported on 08/19/2015 06/06/15   Florencia Reasons, MD   Hospital Meds  . amiodarone  400 mg Oral BID  . antiseptic oral rinse  7 mL  Mouth Rinse BID  . atorvastatin  20 mg Oral Daily  . budesonide (PULMICORT) nebulizer solution  0.25 mg Nebulization BID  . calcium-vitamin D  1 tablet Oral BID  .  ceFAZolin (ANCEF) IV  2 g Intravenous Q8H  . cholecalciferol  2,000 Units Oral Daily  . cycloSPORINE  1 drop Both Eyes BID  . DULoxetine  60 mg Oral Daily  . famotidine  20 mg Oral Q2000  . feeding supplement (PRO-STAT SUGAR FREE 64)  30 mL Oral BID  . fluconazole  100 mg Oral Daily  . hydrocortisone sod succinate (SOLU-CORTEF) inj  50 mg Intravenous Q12H  . lactose free nutrition  237 mL Oral TID WC  . lamoTRIgine  150 mg Oral BID  . latanoprost  1 drop Right Eye QHS  . loratadine  10 mg Oral Daily  . memantine  5 mg Oral BID  . omega-3 acid ethyl esters  2 g Oral BID  . senna  1 tablet Oral BID  . sodium chloride flush  3 mL Intravenous Q12H    Family History  Family History  Problem Relation Age of Onset  . Hypertension Mother     Social History  Social History   Social History  . Marital Status: Widowed    Spouse Name: N/A  . Number of Children: N/A  . Years of Education: N/A   Occupational History  . Not on file.   Social History Main Topics  . Smoking status: Former Smoker    Types: Cigarettes    Quit date: 03/16/1999  . Smokeless tobacco: Not on file  . Alcohol Use: No  . Drug Use: No  . Sexual Activity: Not on file   Other Topics Concern  . Not on file   Social History Narrative     Review of Systems General:  No chills, fever, night sweats or weight changes.  Cardiovascular:  No chest pain, dyspnea on exertion, edema, orthopnea, palpitations, paroxysmal nocturnal dyspnea. Dermatological: No  rash, lesions/masses Respiratory: No cough, dyspnea Urologic: No hematuria, dysuria Abdominal:   No nausea, vomiting, diarrhea, bright red blood per rectum, melena, or hematemesis Neurologic:  No visual changes, wkns, changes in mental status. All other systems reviewed and are otherwise  negative except as noted above.  Physical Exam  Blood pressure 140/77, pulse 107, temperature 97.3 F (36.3 C), temperature source Core (Comment), resp. rate 13, height 5\' 7"  (1.702 m), weight 157 lb 10.1 oz (71.5 kg), SpO2 100 %.  General: Pleasant, NAD, lethargic  Psych: Normal affect. Neuro: Alert and oriented X 3. Moves all extremities spontaneously. HEENT: Normal  Neck: Supple without bruits or JVD. Lungs:  Resp regular and unlabored, CTA. Heart: irregularly irregular, regular rate no s3, s4, or murmurs. Abdomen: Soft, non-tender, non-distended, BS + x 4.  Extremities: No clubbing, cyanosis or edema. DP/PT/Radials 2+ and equal bilaterally.  Labs  Troponin (Point of Care Test) No results for input(s): TROPIPOC in the last 72 hours. No results for input(s): CKTOTAL, CKMB, TROPONINI in the last 72 hours. Lab Results  Component Value Date   WBC 5.0 08/18/2015   HGB 10.1* 08/18/2015   HCT 30.8* 08/18/2015   MCV 88.8 08/18/2015   PLT 72* 08/18/2015     Recent Labs Lab 08/18/15 0354  NA 137  K 3.8  CL 111  CO2 20*  BUN 22*  CREATININE 0.59  CALCIUM 7.7*  PROT 5.5*  BILITOT 0.4  ALKPHOS 75  ALT 32  AST 44*  GLUCOSE 156*   Lab Results  Component Value Date   CHOL * 12/03/2009    231        ATP III CLASSIFICATION:  <200     mg/dL   Desirable  200-239  mg/dL   Borderline High  >=240    mg/dL   High          HDL 51 12/03/2009   LDLCALC * 12/03/2009    114        Total Cholesterol/HDL:CHD Risk Coronary Heart Disease Risk Table                     Men   Women  1/2 Average Risk   3.4   3.3  Average Risk       5.0   4.4  2 X Average Risk   9.6   7.1  3 X Average Risk  23.4   11.0        Use the calculated Patient Ratio above and the CHD Risk Table to determine the patient's CHD Risk.        ATP III CLASSIFICATION (LDL):  <100     mg/dL   Optimal  100-129  mg/dL   Near or Above                    Optimal  130-159  mg/dL   Borderline  160-189  mg/dL    High  >190     mg/dL   Very High   TRIG 332* 12/03/2009   Lab Results  Component Value Date   DDIMER <0.22 10/24/2011     Radiology/Studies  Dg Chest 2 View  08/21/2015  CLINICAL DATA:  Fever, lob lethargy and hypotension for 2 days. EXAM: CHEST  2 VIEW COMPARISON:  06/02/2015 FINDINGS: Mild cardiomegaly. Heart and mediastinal contours are within normal limits. No focal opacities or effusions. No acute bony abnormality. IMPRESSION: Mild cardiomegaly.  No active cardiopulmonary disease. Electronically Signed   By: Lennette Bihari  [place] M.D.   On: 08/19/2015 13:40   Dg Chest Port 1 View  08/17/2015  CLINICAL DATA:  Sepsis.  Emphysema. EXAM: PORTABLE CHEST 1 VIEW COMPARISON:  08/22/2015 FINDINGS: Heart is upper limits normal in size. No confluent airspace opacities. No effusions or acute bony abnormality. IMPRESSION: No active disease. Electronically Signed   By: Rolm Baptise M.D.   On: 08/17/2015 07:11    ECG  Atrial fibrillation, rate controlled in the 60s.    ASSESSMENT AND PLAN  Principal Problem:   Staphylococcus aureus bacteremia with sepsis ([place]) Active Problems:   Peripheral neuropathy (HCC)   Chronic back pain   Dyslipidemia   GERD (gastroesophageal reflux disease)   Constipation   Depression   Vascular dementia without behavioral disturbance   Hypotension   H/O: CVA (cerebrovascular accident)   AKI (acute kidney injury) ([place])   Atrial fibrillation with rapid ventricular response (HCC)   Hypokalemia   UTI (lower urinary tract infection)   Acute encephalopathy   Dehydration   Bacteremia: Probable   Thrombocytopenia (HCC)   Oral thrush   Obtundation   1. Atrial Fibrillation: HR is currently in the 120s. Metoprolol is on hold due to concerns for bradycardia. However, telemetry has been reviewed and patient is not bradycardic. Monitor is not reading correctly due to low voltage. Recommend resuming metoprolol. She was initially placed on PO amiodarone in outpatient clinic, per  Dr. Oval Linsey, on 08/14/15. Continue this. She was on Xarelto for a/c, however this now appears to be on hold. ? Due to low platelet count.  2D echo shows normal LVEF at 50-55%   2. Concerns for  Bradycardia: there are concerns for bradycardia, based on telemetry with reported HR in the 30s. Telemetry strips have been reviewed. Not convinced that telemetry is accurate. No significant prolonged bradycardia/ pauses noted, based on my review. Suspect monitor is not picking up each beach due to extremely low voltage. 2D echo is negative for effusion. Will have MD review telemetry as well to confirm. Can resume metoprolol and continue amiodarone.  3. UTI: on IV antibiotics. Management per IM.   4. Thrombocytopenia: Xarelto is on hold. Monitor.    Signed, Lyda Jester, PA-C 08/18/2015, 11:23 AM  Patient seen and examined. Agree with assessment and plan.  Dorothy Collins is an 82 year old female who is a nursing home resident and has a history of persistant atrial fibrillation for which she has been on Xarelto since November 2016 for anticoagulation.  She has a remote history of CVA, history of carotid stenosis status post left carotid endarterectomy, hyperlipidemia, dementia, anxiety and depression, as well as a history of recurrent UTIs.  She has been seen by Dr. Oval Linsey for her atrial fibrillation and recently was started on an amiodarone load at 400 mg twice a day for 1 week with plans to reduce this to 200 mg daily.  An echo Doppler study in November 2016 showed an EF of 55-60% without wall motion abnormalities.  There was aortic valve sclerosis, mitral annular thickening with mild MR and mild TR.  He is currently admitted with a UTI and has 2 out of 2 positive blood cultures.  An echo Doppler study was repeated today which shows an EF of 50-55%; there was no suggestion of any vegetations.  We were consulted since the telemetry computer was giving and alert that the patient was bradycardic with heart  rates in the 30s.  Her review of her telemetry strips reveal that she has extremely low voltage, but  her atrial fibrillation rate is fast rather than slow with ventricular rate in the 100-120 range.  I suspect the computer was not set at a sensitivity which will allow it to recognize the small QRS complexes.  Presently, her heart rate is now 118 beats a minute despite taking amiodarone 400 mg twice a day.  I would resume metoprolol tartrate initially at 12.5 twice a day, but this dose may need to be further increased ventricular rate does not subside.  Her anticoagulation has been on hold in the setting of thrombocytopenia and her current platelet count 77,000.  There are no signs of spontaneous bleeding.  She was hypokalemic on presentation with a potassium of 3.2, which has improved to 3.8 presently.  Her ECG from 08/24/2015 showed atrial fibrillation at 114 bpm without significant ST-T abnormalities.  There was left axis deviation.  QTc interval was 435 ms.  We will follow the patient with you.  Will recheck a 12-lead ECG in a.m.   Troy Sine, MD, [facility] 08/18/2015 2:54 PM

## 2015-08-18 NOTE — Progress Notes (Signed)
*  PRELIMINARY RESULTS* Echocardiogram 2D Echocardiogram has been performed.  Dorothy Collins 08/18/2015, 10:51 AM

## 2015-08-18 NOTE — Care Management Note (Signed)
Case Management Note  Patient Details  Name: Keniyah B Kashuba MRN: [id number] Date of Birth: 1930-09-26  Subjective/Objective:       Sepsis and hypotension on admission             Action/Plan:Date:  Aala 5, 2017 Chart reviewed for concurrent status and case management needs. Will continue to follow the patient for changes and needs: Expected discharge date: [id number] Velva Harman, BSN, [place], [place]   Expected Discharge Date:  08/21/15               Expected Discharge Plan:  [place]  In-House Referral:  Clinical Social Work  Discharge planning Services  CM Consult  Post Acute Care Choice:  NA Choice offered to:  NA  DME Arranged:  N/A DME Agency:  NA  HH Arranged:  NA HH Agency:  NA  Status of Service:  In process, will continue to follow  Medicare Important Message Given:    Date Medicare IM Given:    Medicare IM give by:    Date Additional Medicare IM Given:    Additional Medicare Important Message give by:     If discussed at [place] of Stay Meetings, dates discussed:    Additional Comments:  Leeroy Cha, RN 08/18/2015, 10:57 AM

## 2015-08-18 NOTE — Progress Notes (Signed)
Key Points: Use following P&T approved IV to PO change policy.  Description contains the criteria that are approved Note: Policy Excludes:  Esophagectomy patientsPHARMACIST - PHYSICIAN COMMUNICATION DR:   Grandville Silos CONCERNING: IV to Oral Route Change Policy  RECOMMENDATION: This patient is receiving Pepcid by the intravenous route.  Based on criteria approved by the Pharmacy and Therapeutics Committee, the intravenous medication(s) is/are being converted to the equivalent oral dose form(s).   DESCRIPTION: These criteria include:  The patient is eating (either orally or via tube) and/or has been taking other orally administered medications for a least 24 hours  The patient has no evidence of active gastrointestinal bleeding or impaired GI absorption (gastrectomy, short bowel, patient on TNA or NPO).  If you have questions about this conversion, please contact the Pharmacy Department  []   [phone] )  [facility] []   [phone] )  [facility] []   [phone] )  [facility] []   [phone] )  [facility] [x]   [phone] )  [place], PharmD, BCPS Pager: [phone] 08/18/2015 8:16 AM

## 2015-08-18 NOTE — Progress Notes (Signed)
Patient had an brief episode this am around [phone] were she became obtunded. HR decreased to as low as 35 bpm non-sustained. She has history of chronic a-fib. BP was taken, neuro check was done, and CBG was checked. Pt.became increasingly alert. A/O to person at baseline and was able to follow commands. Patient is incontinent and was found to have extra large firm bowel movement. Paged and notified attending MD of this episode.

## 2015-08-18 NOTE — Progress Notes (Signed)
PROGRESS NOTE    Dorothy Collins  [id number] DOB: 12/24/1930 DOA: 09/05/2015 PCP: Wenda Low, MD    Brief Narrative:  Patient is a 82 year old female nursing home resident with history of atrial fibrillation on chronic anticoagulation, recurrent UTIs, dementia, depression, who presents to the ED in septic shock secondary to UTI and staph aureus bacteremia. Patient aggressively hydrated with IV fluids and placed on stress dose steroids with improvement in hypotension and mentation.   Assessment & Plan:   Principal Problem:   Septic shock due to urinary tract infection ([place]) Active Problems:   Staphylococcus aureus bacteremia with sepsis (H[facility])   Peripheral neuropathy (HCC)   Chronic back pain   Dyslipidemia   GERD (gastroesophageal reflux disease)   Constipation   Depression   Vascular dementia without behavioral disturbance   Hypotension   H/O: CVA (cerebrovascular accident)   AKI (acute kidney injury) ([place])   Atrial fibrillation with rapid ventricular response (HCC)   Hypokalemia   UTI (lower urinary tract infection)   Acute encephalopathy   Dehydration   Bacteremia: Probable   Thrombocytopenia (HCC)   Oral thrush  #1 septic shock secondary to urinary tract infection and staph aureus bacteremia Patient alert and following commands. Patient complaining of some dizziness. Blood pressure improved on aggressive IV fluid resuscitation and stress dose IV steroids. Blood cultures positive for staph for his bacteremia. Urine cultures pending. Continue empiric IV cefepime and IV vancomycin. Repeat chest x-ray negative for any acute infiltrate. 2 D Echo pending. Decrease stress dose steroids to Solu-Cortef 50 mg IV every 12 hours. Decrease IV fluids to 75 mL per hour. Supportive care. Follow.  #2 atrial fibrillation with RVR CHADS2Vasc SCORE 5/??? Tachybradycardia syndrome Likely secondary to problem #1. Patient was also noted to be lethargic on admission and likely had missed doses  of oral amiodarone which patient was recently started on per cardiology on 08/14/2015. Patient started back on amiodarone 400 mg twice a day 7 days and then 400 mg daily per cardiology recommendations from office visit of 08/14/2015. Patient noted to have an episode of obtundation this morning with heart rate dropping as low as 35 and then coming back up.  Patient's INR was 2.64 on 08/27/2015. Patient now with a thrombocytopenia with a platelet count slowly improving and now at 72 from 68 and as such patient's anticoagulation on Xarelto. Follow.  #3 obtundation Patient noted to have a brief episode of obtundation this morning were heart rate was noted to come down to 35. Patient also noted to have a large bowel movement per nurse and at that time. Heart rate improved back to the low 100s. Patient does have a history of A. fib and currently on amiodarone. Episode of obtundation could likely be vasovagal however due to cardiac history and heart rate of 35 we'll consult with cardiology for further evaluation and management.  #4 thrombocytopenia Likely secondary to acute infection of sepsis. Patient with no overt bleeding. Platelets slowly trending back up. Hold patient's chronic anticoagulation of Xarelto. Discontinued subcutaneous heparin. Follow.  #5 acute kidney injury Likely secondary to a prerenal azotemia. Urine sodium was less than 10. Urine protein was 30. Renal function improved with hydration. Follow.  #6 acute metabolic encephalopathy Secondary to problem #1. Patient alert as her questions appropriately. Clinical improvement. Continue empiric IV antibiotics.  #7 hypokalemia/hypomagnesemia Repleted. Keep magnesium greater than 2.  #8 dehydration Decrease IV fluids to 75 mL per hour.  #9 oral thrush Continue Diflucan.   #10 gastroesophageal reflux disease IV  Pepcid.  #11 depression Resumed home regimen of Cymbalta, Lamictal.  #12 vascular dementia Resumed risperdal and  namenda.   DVT prophylaxis: SCDs Code Status: DO NOT RESUSCITATE Family Communication: Updated patient. No family at bedside. Disposition Plan: Remain in step down unit. Back to nursing facility when medically stable.   Consultants:   Infectious diseases: Dr. Lucianne Lei dam 08/17/2015  Cardiology pending  Procedures:   Chest x-ray 09/07/2015, 08/17/2015    Antimicrobials:   IV cefepime 08/27/2015  IV vancomycin 08/19/2015   Subjective: Patient per nursing was noted to be obtunded briefly this morning and noted to have a heart rate of 35 at that time and also noted to have a large bowel movement. Patient's heart rate subsequently went back up into the low 100s. Patient became more alert. Patient also with complaints of nausea.  Objective: Filed Vitals:   08/18/15 0622 08/18/15 0700 08/18/15 0715 08/18/15 0730  BP: 161/105 186/93 156/98 143/89  Pulse:  126 118 110  Temp:  97.5 F (36.4 C) 97.5 F (36.4 C) 97.3 F (36.3 C)  TempSrc:      Resp:  21 19 20   Height:      Weight:      SpO2:  100% 99% 100%    Intake/Output Summary (Last 24 hours) at 08/18/15 0810 Last data filed at 08/18/15 0758  Gross per 24 hour  Intake   3430 ml  Output    875 ml  Net   2555 ml   Filed Weights   08/27/2015 1815 08/17/15 0500 08/18/15 0423  Weight: 66.9 kg (147 lb 7.8 oz) 68.7 kg (151 lb 7.3 oz) 71.5 kg (157 lb 10.1 oz)    Examination:  General exam: Appears calm and comfortable. Respiratory system: Clear to auscultation. Respiratory effort normal. Cardiovascular system: Irregularly irregular. No JVD, murmurs, rubs, gallops or clicks. No pedal edema. Gastrointestinal system: Abdomen is nondistended, soft and nontender. No organomegaly or masses felt. Normal bowel sounds heard. Central nervous system: Alert and oriented. No focal neurological deficits. Extremities: Symmetric 5 x 5 power. Oropharynx: Upper gums with thrush noted. Psychiatry: Judgement and insight appear normal. Mood  & affect appropriate.     Data Reviewed: I have personally reviewed following labs and imaging studies  CBC:  Recent Labs Lab 08/23/2015 1255 08/17/15 0334 08/18/15 0354  WBC 7.5 3.5* 5.0  NEUTROABS 6.1 3.1 4.1  HGB 12.3 10.6* 10.1*  HCT 36.7 32.5* 30.8*  MCV 88.4 92.1 88.8  PLT 94* 68* 72*   Basic Metabolic Panel:  Recent Labs Lab 09/06/2015 1543 08/22/2015 1835 08/17/15 0334 08/18/15 0354  NA 132*  --  138 137  K 3.2*  --  4.1 3.8  CL 102  --  112* 111  CO2 21*  --  20* 20*  GLUCOSE 167*  --  117* 156*  BUN 50*  --  34* 22*  CREATININE 1.30*  --  0.86 0.59  CALCIUM 7.3*  --  7.1* 7.7*  MG  --  1.6*  --  2.3   GFR: Estimated Creatinine Clearance: 52.7 mL/min (by C-G formula based on Cr of 0.59). Liver Function Tests:  Recent Labs Lab 08/23/2015 1543 08/17/15 0334 08/18/15 0354  AST 60* 61* 44*  ALT 30 33 32  ALKPHOS 64 73 75  BILITOT 0.6 0.5 0.4  PROT 5.3* 5.4* 5.5*  ALBUMIN 2.2* 2.2* 2.3*   No results for input(s): LIPASE, AMYLASE in the last 168 hours. No results for input(s): AMMONIA in the last 168 hours. Coagulation  Profile:  Recent Labs Lab 08/15/2015 1818  INR 2.64*   Cardiac Enzymes: No results for input(s): CKTOTAL, CKMB, CKMBINDEX, TROPONINI in the last 168 hours. BNP (last 3 results) No results for input(s): PROBNP in the last 8760 hours. HbA1C: No results for input(s): HGBA1C in the last 72 hours. CBG:  Recent Labs Lab 08/17/15 0800 08/18/15 0649 08/18/15 0736  GLUCAP 110* 155* 159*   Lipid Profile: No results for input(s): CHOL, HDL, LDLCALC, TRIG, CHOLHDL, LDLDIRECT in the last 72 hours. Thyroid Function Tests: No results for input(s): TSH, T4TOTAL, FREET4, T3FREE, THYROIDAB in the last 72 hours. Anemia Panel: No results for input(s): VITAMINB12, FOLATE, FERRITIN, TIBC, IRON, RETICCTPCT in the last 72 hours. Sepsis Labs:  Recent Labs Lab 08/24/2015 1322 08/24/2015 1556 08/18/2015 1818  PROCALCITON  --   --  1.02  LATICACIDVEN  2.74* 1.37  --     Recent Results (from the past 240 hour(s))  Culture, blood (Routine x 2)     Status: None (Preliminary result)   Collection Time: 08/29/2015  1:10 PM  Result Value Ref Range Status   Specimen Description BLOOD LEFT ANTECUBITAL  Final   Special Requests BOTTLES DRAWN AEROBIC AND ANAEROBIC 1 CC EACH  Final   Culture  Setup Time   Final    IN BOTH AEROBIC AND ANAEROBIC BOTTLES GRAM POSITIVE COCCI IN CLUSTERS CRITICAL RESULT CALLED TO, READ BACK BY AND VERIFIED WITH: N GLOGOVAC,PHARMD AT [id number] 08/17/15 BY L BENFIELD    Culture   Final    NO GROWTH < 24 HOURS Performed at [facility]    Report Status PENDING  Incomplete  Blood Culture ID Panel (Reflexed)     Status: Abnormal   Collection Time: 08/22/2015  1:10 PM  Result Value Ref Range Status   Enterococcus species NOT DETECTED NOT DETECTED Final   Vancomycin resistance NOT DETECTED NOT DETECTED Final   Listeria monocytogenes NOT DETECTED NOT DETECTED Final   Staphylococcus species DETECTED (A) NOT DETECTED Final    Comment: CRITICAL RESULT CALLED TO, READ BACK BY AND VERIFIED WITH: N GLOGOVAC,PHARMD AT 0735 08/17/15 BY L BENFIELD    Staphylococcus aureus DETECTED (A) NOT DETECTED Final    Comment: CRITICAL RESULT CALLED TO, READ BACK BY AND VERIFIED WITH: N GLOGOVAC,PHARMD AT 0735 08/17/15 BY L BENFIELD    Methicillin resistance NOT DETECTED NOT DETECTED Final   Streptococcus species NOT DETECTED NOT DETECTED Final   Streptococcus agalactiae NOT DETECTED NOT DETECTED Final   Streptococcus pneumoniae NOT DETECTED NOT DETECTED Final   Streptococcus pyogenes NOT DETECTED NOT DETECTED Final   Acinetobacter baumannii NOT DETECTED NOT DETECTED Final   Enterobacteriaceae species NOT DETECTED NOT DETECTED Final   Enterobacter cloacae complex NOT DETECTED NOT DETECTED Final   Escherichia coli NOT DETECTED NOT DETECTED Final   Klebsiella oxytoca NOT DETECTED NOT DETECTED Final   Klebsiella pneumoniae NOT DETECTED NOT  DETECTED Final   Proteus species NOT DETECTED NOT DETECTED Final   Serratia marcescens NOT DETECTED NOT DETECTED Final   Carbapenem resistance NOT DETECTED NOT DETECTED Final   Haemophilus influenzae NOT DETECTED NOT DETECTED Final   Neisseria meningitidis NOT DETECTED NOT DETECTED Final   Pseudomonas aeruginosa NOT DETECTED NOT DETECTED Final   Candida albicans NOT DETECTED NOT DETECTED Final   Candida glabrata NOT DETECTED NOT DETECTED Final   Candida krusei NOT DETECTED NOT DETECTED Final   Candida parapsilosis NOT DETECTED NOT DETECTED Final   Candida tropicalis NOT DETECTED NOT DETECTED Final  Comment: Performed at [facility]  Culture, blood (Routine x 2)     Status: None (Preliminary result)   Collection Time: 09/01/2015  1:50 PM  Result Value Ref Range Status   Specimen Description BLOOD RIGHT HAND  Final   Special Requests IN PEDIATRIC BOTTLE 3 CC  Final   Culture  Setup Time   Final    PED BOTTLE GRAM POSITIVE COCCI IN CLUSTERS CRITICAL RESULT CALLED TO, READ BACK BY AND VERIFIED WITH: N GLOGOVAC,PARMD AT 0735 08/17/15 BY L BENFIELD    Culture   Final    NO GROWTH < 24 HOURS Performed at [facility]    Report Status PENDING  Incomplete  Urine culture     Status: Abnormal   Collection Time: 08/25/2015  3:19 PM  Result Value Ref Range Status   Specimen Description URINE, CATHETERIZED  Final   Special Requests NONE  Final   Culture (A)  Final    <10,000 COLONIES/mL INSIGNIFICANT GROWTH Performed at [facility]    Report Status 08/17/2015 FINAL  Final  MRSA PCR Screening     Status: None   Collection Time: 08/21/2015  6:19 PM  Result Value Ref Range Status   MRSA by PCR NEGATIVE NEGATIVE Final    Comment:        The GeneXpert MRSA Assay (FDA approved for NASAL specimens only), is one component of a comprehensive MRSA colonization surveillance program. It is not intended to diagnose MRSA infection nor to guide or monitor treatment for MRSA  infections.   Culture, blood (Routine X 2) w Reflex to ID Panel     Status: None (Preliminary result)   Collection Time: 08/17/15  2:20 PM  Result Value Ref Range Status   Specimen Description BLOOD RIGHT HAND  Final   Special Requests IN PEDIATRIC BOTTLE 4CC  Final   Culture PENDING  Incomplete   Report Status PENDING  Incomplete         Radiology Studies: Dg Chest 2 View  08/24/2015  CLINICAL DATA:  Fever, lob lethargy and hypotension for 2 days. EXAM: CHEST  2 VIEW COMPARISON:  06/02/2015 FINDINGS: Mild cardiomegaly. Heart and mediastinal contours are within normal limits. No focal opacities or effusions. No acute bony abnormality. IMPRESSION: Mild cardiomegaly.  No active cardiopulmonary disease. Electronically Signed   By: Rolm Baptise M.D.   On: 08/23/2015 13:40   Dg Chest Port 1 View  08/17/2015  CLINICAL DATA:  Sepsis.  Emphysema. EXAM: PORTABLE CHEST 1 VIEW COMPARISON:  09/05/2015 FINDINGS: Heart is upper limits normal in size. No confluent airspace opacities. No effusions or acute bony abnormality. IMPRESSION: No active disease. Electronically Signed   By: Rolm Baptise M.D.   On: 08/17/2015 07:11        Scheduled Meds: . amiodarone  400 mg Oral BID  . antiseptic oral rinse  7 mL Mouth Rinse BID  . atorvastatin  20 mg Oral Daily  . budesonide (PULMICORT) nebulizer solution  0.25 mg Nebulization BID  . calcium-vitamin D  1 tablet Oral BID  .  ceFAZolin (ANCEF) IV  2 g Intravenous Q8H  . cholecalciferol  2,000 Units Oral Daily  . cycloSPORINE  1 drop Both Eyes BID  . DULoxetine  60 mg Oral Daily  . famotidine (PEPCID) IV  20 mg Intravenous Q24H  . feeding supplement (PRO-STAT SUGAR FREE 64)  30 mL Oral BID  . fluconazole  100 mg Oral Daily  . hydrocortisone sod succinate (SOLU-CORTEF) inj  50  mg Intravenous Q12H  . lactose free nutrition  237 mL Oral TID WC  . lamoTRIgine  150 mg Oral BID  . latanoprost  1 drop Right Eye QHS  . loratadine  10 mg Oral Daily  .  memantine  5 mg Oral BID  . omega-3 acid ethyl esters  2 g Oral BID  . senna  1 tablet Oral BID  . sodium chloride flush  3 mL Intravenous Q12H   Continuous Infusions: . sodium chloride 75 mL/hr at 08/17/15 1323     LOS: 2 days    Time spent: 43 minutes    THOMPSON,DANIEL, MD [facility] Pager [phone]   If 7PM-7AM, please contact night-coverage [email or web address] Password [email or web address] 08/18/2015, 8:10 AM

## 2015-08-19 DIAGNOSIS — I959 Hypotension, unspecified: Secondary | ICD-10-CM

## 2015-08-19 DIAGNOSIS — N179 Acute kidney failure, unspecified: Secondary | ICD-10-CM

## 2015-08-19 DIAGNOSIS — R5383 Other fatigue: Secondary | ICD-10-CM

## 2015-08-19 DIAGNOSIS — N390 Urinary tract infection, site not specified: Secondary | ICD-10-CM

## 2015-08-19 DIAGNOSIS — E785 Hyperlipidemia, unspecified: Secondary | ICD-10-CM

## 2015-08-19 LAB — CBC
HEMATOCRIT: 32.5 % — AB (ref 36.0–46.0)
HEMOGLOBIN: 10.7 g/dL — AB (ref 12.0–15.0)
MCH: 29.6 pg (ref 26.0–34.0)
MCHC: 32.9 g/dL (ref 30.0–36.0)
MCV: 90 fL (ref 78.0–100.0)
Platelets: 111 10*3/uL — ABNORMAL LOW (ref 150–400)
RBC: 3.61 MIL/uL — AB (ref 3.87–5.11)
RDW: 14.6 % (ref 11.5–15.5)
WBC: 8.5 10*3/uL (ref 4.0–10.5)

## 2015-08-19 LAB — CULTURE, BLOOD (ROUTINE X 2)

## 2015-08-19 LAB — GLUCOSE, CAPILLARY: Glucose-Capillary: 119 mg/dL — ABNORMAL HIGH (ref 65–99)

## 2015-08-19 LAB — BASIC METABOLIC PANEL
ANION GAP: 9 (ref 5–15)
BUN: 17 mg/dL (ref 6–20)
CALCIUM: 8.1 mg/dL — AB (ref 8.9–10.3)
CO2: 20 mmol/L — AB (ref 22–32)
Chloride: 110 mmol/L (ref 101–111)
Creatinine, Ser: 0.53 mg/dL (ref 0.44–1.00)
GFR calc Af Amer: >60 mL/min (ref >60)
GLUCOSE: 123 mg/dL — AB (ref 65–99)
Potassium: 3.4 mmol/L — ABNORMAL LOW (ref 3.5–5.1)
Sodium: 139 mmol/L (ref 135–145)

## 2015-08-19 MED ORDER — POTASSIUM CHLORIDE CRYS ER 20 MEQ PO TBCR
40.0000 meq | EXTENDED_RELEASE_TABLET | Freq: Once | ORAL | Status: AC
  Administered 2015-08-19: 40 meq via ORAL
  Filled 2015-08-19: qty 2

## 2015-08-19 MED ORDER — HYDROCORTISONE NA SUCCINATE PF 100 MG IJ SOLR
50.0000 mg | Freq: Every day | INTRAMUSCULAR | Status: DC
  Administered 2015-08-20: 50 mg via INTRAVENOUS
  Filled 2015-08-19: qty 2

## 2015-08-19 MED ORDER — METOPROLOL TARTRATE 12.5 MG HALF TABLET
12.5000 mg | ORAL_TABLET | Freq: Once | ORAL | Status: AC
  Administered 2015-08-19: 12.5 mg via ORAL

## 2015-08-19 MED ORDER — METOPROLOL TARTRATE 25 MG PO TABS
25.0000 mg | ORAL_TABLET | Freq: Two times a day (BID) | ORAL | Status: DC
  Administered 2015-08-19: 25 mg via ORAL
  Filled 2015-08-19: qty 1

## 2015-08-19 MED ORDER — RIVAROXABAN 15 MG PO TABS
15.0000 mg | ORAL_TABLET | Freq: Every day | ORAL | Status: DC
  Administered 2015-08-19: 15 mg via ORAL
  Filled 2015-08-19: qty 1

## 2015-08-19 MED ORDER — AMIODARONE HCL 200 MG PO TABS: 400.0000 mg | ORAL_TABLET | Freq: Every day | ORAL | Status: DC

## 2015-08-19 NOTE — Progress Notes (Signed)
Patient ID: Dorothy Collins, female   DOB: March 03, 1931, 82 y.o.   MRN: [id number]         [facility]    Date of Admission:  08/18/2015    Total days of antibiotics 4        Day  3 cefazolin         Principal Problem:   Staphylococcus aureus bacteremia with sepsis (HCC) Active Problems:   Peripheral neuropathy (HCC)   Chronic back pain   Dyslipidemia   GERD (gastroesophageal reflux disease)   Constipation   Depression   Vascular dementia without behavioral disturbance   Hypotension   H/O: CVA (cerebrovascular accident)   AKI (acute kidney injury) ([place])   Atrial fibrillation with rapid ventricular response (HCC)   Hypokalemia   UTI (lower urinary tract infection)   Acute encephalopathy   Dehydration   Thrombocytopenia (HCC)   Oral thrush   Obtundation   . [START ON 2015-08-21] amiodarone  400 mg Oral Daily  . antiseptic oral rinse  7 mL Mouth Rinse BID  . atorvastatin  20 mg Oral Daily  . budesonide (PULMICORT) nebulizer solution  0.25 mg Nebulization BID  . calcium-vitamin D  1 tablet Oral BID  .  ceFAZolin (ANCEF) IV  2 g Intravenous Q8H  . cholecalciferol  2,000 Units Oral Daily  . cycloSPORINE  1 drop Both Eyes BID  . DULoxetine  60 mg Oral Daily  . famotidine  20 mg Oral Q2000  . feeding supplement (PRO-STAT SUGAR FREE 64)  30 mL Oral BID  . fluconazole  100 mg Oral Daily  . [START ON 08-21-2015] hydrocortisone sod succinate (SOLU-CORTEF) inj  50 mg Intravenous Daily  . lactose free nutrition  237 mL Oral TID WC  . lamoTRIgine  150 mg Oral BID  . latanoprost  1 drop Right Eye QHS  . loratadine  10 mg Oral Daily  . memantine  5 mg Oral BID  . metoprolol tartrate  25 mg Oral BID  . omega-3 acid ethyl esters  2 g Oral BID  . rivaroxaban  15 mg Oral Q supper  . senna  1 tablet Oral BID  . sodium chloride flush  3 mL Intravenous Q12H    Review of Systems: Review of Systems  Unable to perform ROS: mental acuity  Constitutional:       She is  more lethargic today. She does not seem to remember our conversation yesterday about her staph bacteremia.    Past Medical History  Diagnosis Date  . Dementia   . CVA (cerebral vascular accident) ([place])   . COPD (chronic obstructive pulmonary disease) (HCC)     Emphysema  . GERD (gastroesophageal reflux disease)   . Glaucoma   . Hyperlipidemia   . Insomnia   . Rhinitis 12/14/1999  . Depression 10/12/2009  . Cancer ([place]) 07/18/2008    Breast cancer-Left  . Hypertension   . Arthritis 07/18/2008    osteoporosis  . Peripheral neuropathy ([place]) 07/18/2008  . Cerebrovascular disease 08/05/2003    acute  . Lumbago 08/05/2003  . Edema 08/05/2003  . Cellulitis and abscess of leg 08/05/2003  . Peripheral vascular disease ([place]) 08/05/2003  . Anxiety 08/05/2003  . Hypopotassemia 08/05/2003  . Anemia     Social History  Substance Use Topics  . Smoking status: Former Smoker    Types: Cigarettes    Quit date: 03/16/1999  . Smokeless tobacco: None  . Alcohol Use: No    Family History  Problem Relation Age of Onset  . Hypertension Mother    Allergies  Allergen Reactions  . Lyrica [Pregabalin]     Unknown; listed on MAR  . Morphine And Related     Unknown; listed on MAR  . Penicillins     Unknown; listed on MAR  . Sulfa Antibiotics     Unknown; listed on mar    OBJECTIVE: Filed Vitals:   08/19/15 1200 08/19/15 1300 08/19/15 1400 08/19/15 1500  BP: 149/53 149/91 150/88 146/97  Pulse: 114 104 101 101  Temp: 97.9 F (36.6 C) 98.1 F (36.7 C) 97.7 F (36.5 C) 98.4 F (36.9 C)  TempSrc:      Resp: 20 21 32 23  Height:      Weight:      SpO2: 98% 96% 95% 96%   Body mass index is 25.23 kg/(m^2).  Physical Exam  Constitutional:  She is reclining in a chair. She is very lethargic. She is coughing and has audible wheezing.  Cardiovascular:  No murmur heard. Distant heart sounds. Irregularly irregular rhythm.  Pulmonary/Chest: She is in respiratory distress. She has wheezes. She  has no rales.  Abdominal: Soft.  Musculoskeletal: Normal range of motion. She exhibits no edema or tenderness.  There is a healed incision over her right knee from prior arthroplasty. Her right knee is slightly warmer than her left.  Skin:  Several excoriations on her face that do not appear infected.    Lab Results Lab Results  Component Value Date   WBC 8.5 08/19/2015   HGB 10.7* 08/19/2015   HCT 32.5* 08/19/2015   MCV 90.0 08/19/2015   PLT 111* 08/19/2015    Lab Results  Component Value Date   CREATININE 0.53 08/19/2015   BUN 17 08/19/2015   NA 139 08/19/2015   K 3.4* 08/19/2015   CL 110 08/19/2015   CO2 20* 08/19/2015    Lab Results  Component Value Date   ALT 32 08/18/2015   AST 44* 08/18/2015   ALKPHOS 75 08/18/2015   BILITOT 0.4 08/18/2015     Microbiology: Recent Results (from the past 240 hour(s))  Culture, blood (Routine x 2)     Status: Abnormal   Collection Time: 08/19/2015  1:10 PM  Result Value Ref Range Status   Specimen Description BLOOD LEFT ANTECUBITAL  Final   Special Requests BOTTLES DRAWN AEROBIC AND ANAEROBIC 1 CC EACH  Final   Culture  Setup Time   Final    IN BOTH AEROBIC AND ANAEROBIC BOTTLES GRAM POSITIVE COCCI IN CLUSTERS CRITICAL RESULT CALLED TO, READ BACK BY AND VERIFIED WITH: N GLOGOVAC,PHARMD AT [id number] 08/17/15 BY L BENFIELD Performed at [facility] (A)  Final   Report Status 08/19/2015 FINAL  Final   Organism ID, Bacteria STAPHYLOCOCCUS AUREUS  Final      Susceptibility   Staphylococcus aureus - MIC*    CIPROFLOXACIN <=0.5 SENSITIVE Sensitive     ERYTHROMYCIN <=0.25 SENSITIVE Sensitive     GENTAMICIN <=0.5 SENSITIVE Sensitive     OXACILLIN 0.5 SENSITIVE Sensitive     TETRACYCLINE <=1 SENSITIVE Sensitive     VANCOMYCIN 1 SENSITIVE Sensitive     TRIMETH/SULFA <=10 SENSITIVE Sensitive     CLINDAMYCIN <=0.25 SENSITIVE Sensitive     RIFAMPIN <=0.5 SENSITIVE Sensitive     Inducible Clindamycin  NEGATIVE Sensitive     * STAPHYLOCOCCUS AUREUS  Blood Culture ID Panel (Reflexed)     Status: Abnormal   Collection Time:  08/30/2015  1:10 PM  Result Value Ref Range Status   Enterococcus species NOT DETECTED NOT DETECTED Final   Vancomycin resistance NOT DETECTED NOT DETECTED Final   Listeria monocytogenes NOT DETECTED NOT DETECTED Final   Staphylococcus species DETECTED (A) NOT DETECTED Final    Comment: CRITICAL RESULT CALLED TO, READ BACK BY AND VERIFIED WITH: N GLOGOVAC,PHARMD AT 0735 08/17/15 BY L BENFIELD    Staphylococcus aureus DETECTED (A) NOT DETECTED Final    Comment: CRITICAL RESULT CALLED TO, READ BACK BY AND VERIFIED WITH: N GLOGOVAC,PHARMD AT 0735 08/17/15 BY L BENFIELD    Methicillin resistance NOT DETECTED NOT DETECTED Final   Streptococcus species NOT DETECTED NOT DETECTED Final   Streptococcus agalactiae NOT DETECTED NOT DETECTED Final   Streptococcus pneumoniae NOT DETECTED NOT DETECTED Final   Streptococcus pyogenes NOT DETECTED NOT DETECTED Final   Acinetobacter baumannii NOT DETECTED NOT DETECTED Final   Enterobacteriaceae species NOT DETECTED NOT DETECTED Final   Enterobacter cloacae complex NOT DETECTED NOT DETECTED Final   Escherichia coli NOT DETECTED NOT DETECTED Final   Klebsiella oxytoca NOT DETECTED NOT DETECTED Final   Klebsiella pneumoniae NOT DETECTED NOT DETECTED Final   Proteus species NOT DETECTED NOT DETECTED Final   Serratia marcescens NOT DETECTED NOT DETECTED Final   Carbapenem resistance NOT DETECTED NOT DETECTED Final   Haemophilus influenzae NOT DETECTED NOT DETECTED Final   Neisseria meningitidis NOT DETECTED NOT DETECTED Final   Pseudomonas aeruginosa NOT DETECTED NOT DETECTED Final   Candida albicans NOT DETECTED NOT DETECTED Final   Candida glabrata NOT DETECTED NOT DETECTED Final   Candida krusei NOT DETECTED NOT DETECTED Final   Candida parapsilosis NOT DETECTED NOT DETECTED Final   Candida tropicalis NOT DETECTED NOT DETECTED Final     Comment: Performed at [facility]  Culture, blood (Routine x 2)     Status: Abnormal   Collection Time: 09/11/2015  1:50 PM  Result Value Ref Range Status   Specimen Description BLOOD RIGHT HAND  Final   Special Requests IN PEDIATRIC BOTTLE 3 CC  Final   Culture  Setup Time   Final    PED BOTTLE GRAM POSITIVE COCCI IN CLUSTERS CRITICAL RESULT CALLED TO, READ BACK BY AND VERIFIED WITH: N GLOGOVAC,PARMD AT 0735 08/17/15 BY L BENFIELD    Culture (A)  Final    STAPHYLOCOCCUS AUREUS SUSCEPTIBILITIES PERFORMED ON PREVIOUS CULTURE WITHIN THE LAST 5 DAYS. Performed at [facility]    Report Status 08/19/2015 FINAL  Final  Urine culture     Status: Abnormal   Collection Time: 09/04/2015  3:19 PM  Result Value Ref Range Status   Specimen Description URINE, CATHETERIZED  Final   Special Requests NONE  Final   Culture (A)  Final    <10,000 COLONIES/mL INSIGNIFICANT GROWTH Performed at [facility]    Report Status 08/17/2015 FINAL  Final  MRSA PCR Screening     Status: None   Collection Time: 09/04/2015  6:19 PM  Result Value Ref Range Status   MRSA by PCR NEGATIVE NEGATIVE Final    Comment:        The GeneXpert MRSA Assay (FDA approved for NASAL specimens only), is one component of a comprehensive MRSA colonization surveillance program. It is not intended to diagnose MRSA infection nor to guide or monitor treatment for MRSA infections.   Culture, blood (Routine X 2) w Reflex to ID Panel     Status: None (Preliminary result)   Collection Time: 08/17/15  2:20 PM  Result Value Ref Range Status   Specimen Description BLOOD RIGHT ANTECUBITAL  Final   Special Requests BOTTLES DRAWN AEROBIC AND ANAEROBIC 5CC  Final   Culture   Final    NO GROWTH < 24 HOURS Performed at [facility]    Report Status PENDING  Incomplete  Culture, blood (Routine X 2) w Reflex to ID Panel     Status: None (Preliminary result)   Collection Time: 08/17/15  2:20 PM  Result Value Ref  Range Status   Specimen Description BLOOD RIGHT HAND  Final   Special Requests IN PEDIATRIC BOTTLE 4CC  Final   Culture  Setup Time   Final    GRAM POSITIVE COCCI IN CLUSTERS AEROBIC BOTTLE ONLY CRITICAL RESULT CALLED TO, READ BACK BY AND VERIFIED WITH: A. Atwood, PHARMACY AT 1054 ON [id number] BY Rhea Bleacher    Culture   Final    TOO YOUNG TO READ Performed at [facility]    Report Status PENDING  Incomplete     ASSESSMENT: She has defervesced. One of 2 repeat blood cultures from 08/17/2015 is growing gram-positive cocci in clusters. I have repeated blood cultures today. I will hold off on PICC placement until blood cultures are negative. Transthoracic echocardiogram did not reveal any clear evidence of endocarditis. I am reluctant to proceed with transesophageal echocardiogram. Given her age and underlying  Medical conditions her overall prognosis is poor. I discussed this with Dr. Grandville Silos and he indicated that Ms. Dixon daughter did not want an aggressive management plan beyond the trial of IV antibiotics. I will plan on continuing IV cefazolin for at least 3 weeks.  PLAN: 1. Continue cefazolin 2. Await results of repeat blood  3. Hold off on PICC placement  Michel Bickers, MD [facility] for [place] [phone] pager   [phone] cell 08/19/2015, 3:20 PM

## 2015-08-19 NOTE — Progress Notes (Signed)
PROGRESS NOTE    Dorothy Collins  [id number] DOB: [birth year] DOA: 08/23/2015 PCP: Wenda Low, MD    Brief Narrative:  Patient is a 82 year old female nursing home resident with history of atrial fibrillation on chronic anticoagulation, recurrent UTIs, dementia, depression, who presents to the ED in septic shock secondary to UTI and MSSA bacteremia. Patient aggressively hydrated with IV fluids and placed on stress dose steroids with improvement in hypotension and mentation. ID following. Patient also noted to go into A. fib with RVR currently on oral amiodarone and oral metoprolol. Cardiology following.    Assessment & Plan:   Principal Problem:   Staphylococcus aureus bacteremia with sepsis ([place]) Active Problems:   Peripheral neuropathy (HCC)   Chronic back pain   Dyslipidemia   GERD (gastroesophageal reflux disease)   Constipation   Depression   Vascular dementia without behavioral disturbance   Hypotension   H/O: CVA (cerebrovascular accident)   AKI (acute kidney injury) ([place])   Atrial fibrillation with rapid ventricular response (HCC)   Hypokalemia   UTI (lower urinary tract infection)   Acute encephalopathy   Dehydration   Bacteremia: Probable   Thrombocytopenia (HCC)   Oral thrush   Obtundation  #1 septic shock secondary to urinary tract infection and MSSA bacteremia Patient alert and following commands. Blood pressure improved on aggressive IV fluid resuscitation and stress dose IV steroids. Blood cultures positive for MSSA bacteremia. Urine cultures with no significant growth. 2-D echo with EF of 50-55% with no wall motion abnormalities. No vegetations noted. Repeat blood cultures 08/17/2015 positive with gram-positive cocci in clusters preliminary. Repeat chest x-ray negative for any acute infiltrate. Decrease stress dose steroids to Solu-Cortef 50 mg IV daily. Decreased IV fluids to 75 mL per hour. Continue empiric IV Ancef. Will need to repeat blood cultures in the  next 1-2 days to ensure blood cultures are negative prior to PICC line placement. ID following. Supportive care. Follow.  #2 atrial fibrillation with RVR CHADS2Vasc SCORE 5/??? Tachybradycardia syndrome Likely secondary to problem #1. Patient was also noted to be lethargic on admission and likely had missed doses of oral amiodarone which patient was recently started on per cardiology on 08/14/2015. Patient started back on amiodarone 400 mg twice a day 7 days and then 400 mg daily per cardiology recommendations from office visit of 08/14/2015. Patient noted to have an episode of obtundation the morning of 08/18/2015, with heart rate dropping as low as 35 and then coming back up. 2-D echo with EF of 50-55% with no wall motion abnormalities.  Patient's INR was 2.64 on 09/03/2015. Patient now with a thrombocytopenia with a platelet count slowly improving and now at 111 from 72 from 68 and as such patient's anticoagulation of Xarelto was held. Heart rate still elevated and patient has been started on metoprolol as well for better rate control. Will resume anticoagulation with Xarelto and monitor platelet count closely and monitor for bleeding. Per cardiology.  #3 obtundation Patient noted to have a brief episode of obtundation this morning were heart rate was noted to come down to 35. Patient also noted to have a large bowel movement per nurse and at that time. Heart rate improved back to the low 100s. Patient does have a history of A. fib and currently on amiodarone. Episode of obtundation could likely be vasovagal however due to cardiac history and heart rate of 35, cardiology was consulted. Per cardiology who reviewed telemetry strips reveal no significant bradycardia or pauses noted and telemetry strips have extremely low  voltage and suspect computer was not sent at the sensitivity which will allow it to recognize small QRS complexes.   #4 thrombocytopenia Likely secondary to acute infection of sepsis.  Patient with no overt bleeding. Platelets slowly trending back up. Discontinued subcutaneous heparin. Will resume Xarelto monitor closely. Follow.  #5 acute kidney injury Likely secondary to a prerenal azotemia. Urine sodium was less than 10. Urine protein was 30. Renal function improved with hydration. Resolved.   #6 acute metabolic encephalopathy Secondary to problem #1. Patient alert answering questions appropriately. Clinical improvement. Continue empiric IV antibiotics.  #7 hypokalemia/hypomagnesemia Replete. Keep magnesium greater than 2.  #8 dehydration Continue IV fluids.   #9 oral thrush Continue Diflucan.   #10 gastroesophageal reflux disease Continue Pepcid.  #11 depression Continue Cymbalta, Lamictal.  #12 vascular dementia Continue risperdal and namenda.   DVT prophylaxis: SCDs Code Status: DO NOT RESUSCITATE Family Communication: Updated patient. No family at bedside. Disposition Plan: Remain in step down unit. Back to nursing facility when medically stable.   Consultants:   Infectious diseases: Dr. Lucianne Lei dam 08/17/2015  Cardiology : Dr. Claiborne Billings 08/18/2015  Procedures:   Chest x-ray 08/17/2015, 08/17/2015  2-D echo 08/18/2015  Antimicrobials:   IV cefepime 08/23/2015>>>>>> 08/17/2015  IV vancomycin 08/19/2015>>>>> 08/17/2015  IV Ancef 08/17/2015   Subjective: Patient with no complaints. Patient denies any chest pain. No shortness of breath.  Objective: Filed Vitals:   08/19/15 0500 08/19/15 0602 08/19/15 0700 08/19/15 0800  BP: 132/89 157/122 145/86 168/78  Pulse: 123 132 133 120  Temp: 98.4 F (36.9 C) 98.8 F (37.1 C) 98.6 F (37 C) 98.8 F (37.1 C)  TempSrc:      Resp: 23 18 26 24   Height:      Weight: 73.1 kg (161 lb 2.5 oz)     SpO2: 98% 97% 96% 96%    Intake/Output Summary (Last 24 hours) at 08/19/15 1038 Last data filed at 08/19/15 0800  Gross per 24 hour  Intake   1945 ml  Output    700 ml  Net   1245 ml   Filed  Weights   08/17/15 0500 08/18/15 0423 08/19/15 0500  Weight: 68.7 kg (151 lb 7.3 oz) 71.5 kg (157 lb 10.1 oz) 73.1 kg (161 lb 2.5 oz)    Examination:  General exam: Appears calm and comfortable. Respiratory system: Clear to auscultation anterior lung fields. Respiratory effort normal. Cardiovascular system: Irregularly irregular. No JVD, murmurs, rubs, gallops or clicks. No pedal edema. Gastrointestinal system: Abdomen is nondistended, soft and nontender. No organomegaly or masses felt. Normal bowel sounds heard. Central nervous system: Alert and oriented. No focal neurological deficits. Extremities: Symmetric 5 x 5 power. Oropharynx: Upper gums with improved thrush noted. Psychiatry: Judgement and insight appear normal. Mood & affect appropriate.     Data Reviewed: I have personally reviewed following labs and imaging studies  CBC:  Recent Labs Lab 09/12/2015 1255 08/17/15 0334 08/18/15 0354 08/19/15 0328  WBC 7.5 3.5* 5.0 8.5  NEUTROABS 6.1 3.1 4.1  --   HGB 12.3 10.6* 10.1* 10.7*  HCT 36.7 32.5* 30.8* 32.5*  MCV 88.4 92.1 88.8 90.0  PLT 94* 68* 72* [id number]*   Basic Metabolic Panel:  Recent Labs Lab 08/17/2015 1543 09/05/2015 1835 08/17/15 0334 08/18/15 0354 08/19/15 0328  NA 132*  --  138 137 139  K 3.2*  --  4.1 3.8 3.4*  CL 102  --  112* 111 110  CO2 21*  --  20* 20* 20*  GLUCOSE 167*  --  117* 156* 123*  BUN 50*  --  34* 22* 17  CREATININE 1.30*  --  0.86 0.59 0.53  CALCIUM 7.3*  --  7.1* 7.7* 8.1*  MG  --  1.6*  --  2.3  --    GFR: Estimated Creatinine Clearance: 52.7 mL/min (by C-G formula based on Cr of 0.53). Liver Function Tests:  Recent Labs Lab 08/30/2015 1543 08/17/15 0334 08/18/15 0354  AST 60* 61* 44*  ALT 30 33 32  ALKPHOS 64 73 75  BILITOT 0.6 0.5 0.4  PROT 5.3* 5.4* 5.5*  ALBUMIN 2.2* 2.2* 2.3*   No results for input(s): LIPASE, AMYLASE in the last 168 hours. No results for input(s): AMMONIA in the last 168 hours. Coagulation  Profile:  Recent Labs Lab 08/14/2015 1818  INR 2.64*   Cardiac Enzymes: No results for input(s): CKTOTAL, CKMB, CKMBINDEX, TROPONINI in the last 168 hours. BNP (last 3 results) No results for input(s): PROBNP in the last 8760 hours. HbA1C: No results for input(s): HGBA1C in the last 72 hours. CBG:  Recent Labs Lab 08/17/15 0800 08/18/15 0649 08/18/15 0736 08/19/15 0730  GLUCAP 110* 155* 159* 119*   Lipid Profile: No results for input(s): CHOL, HDL, LDLCALC, TRIG, CHOLHDL, LDLDIRECT in the last 72 hours. Thyroid Function Tests: No results for input(s): TSH, T4TOTAL, FREET4, T3FREE, THYROIDAB in the last 72 hours. Anemia Panel: No results for input(s): VITAMINB12, FOLATE, FERRITIN, TIBC, IRON, RETICCTPCT in the last 72 hours. Sepsis Labs:  Recent Labs Lab 08/15/2015 1322 08/21/2015 1556 08/19/2015 1818  PROCALCITON  --   --  1.02  LATICACIDVEN 2.74* 1.37  --     Recent Results (from the past 240 hour(s))  Culture, blood (Routine x 2)     Status: Abnormal   Collection Time: 08/21/2015  1:10 PM  Result Value Ref Range Status   Specimen Description BLOOD LEFT ANTECUBITAL  Final   Special Requests BOTTLES DRAWN AEROBIC AND ANAEROBIC 1 CC EACH  Final   Culture  Setup Time   Final    IN BOTH AEROBIC AND ANAEROBIC BOTTLES GRAM POSITIVE COCCI IN CLUSTERS CRITICAL RESULT CALLED TO, READ BACK BY AND VERIFIED WITH: N GLOGOVAC,PHARMD AT [id number] 08/17/15 BY L BENFIELD Performed at [place] (A)  Final   Report Status 08/19/2015 FINAL  Final   Organism ID, Bacteria STAPHYLOCOCCUS AUREUS  Final      Susceptibility   Staphylococcus aureus - MIC*    CIPROFLOXACIN <=0.5 SENSITIVE Sensitive     ERYTHROMYCIN <=0.25 SENSITIVE Sensitive     GENTAMICIN <=0.5 SENSITIVE Sensitive     OXACILLIN 0.5 SENSITIVE Sensitive     TETRACYCLINE <=1 SENSITIVE Sensitive     VANCOMYCIN 1 SENSITIVE Sensitive     TRIMETH/SULFA <=10 SENSITIVE Sensitive     CLINDAMYCIN  <=0.25 SENSITIVE Sensitive     RIFAMPIN <=0.5 SENSITIVE Sensitive     Inducible Clindamycin NEGATIVE Sensitive     * STAPHYLOCOCCUS AUREUS  Blood Culture ID Panel (Reflexed)     Status: Abnormal   Collection Time: 09/09/2015  1:10 PM  Result Value Ref Range Status   Enterococcus species NOT DETECTED NOT DETECTED Final   Vancomycin resistance NOT DETECTED NOT DETECTED Final   Listeria monocytogenes NOT DETECTED NOT DETECTED Final   Staphylococcus species DETECTED (A) NOT DETECTED Final    Comment: CRITICAL RESULT CALLED TO, READ BACK BY AND VERIFIED WITH: N GLOGOVAC,PHARMD AT QF:[id number] 08/17/15 BY L BENFIELD    Staphylococcus aureus DETECTED (A)  NOT DETECTED Final    Comment: CRITICAL RESULT CALLED TO, READ BACK BY AND VERIFIED WITH: N GLOGOVAC,PHARMD AT 0735 08/17/15 BY L BENFIELD    Methicillin resistance NOT DETECTED NOT DETECTED Final   Streptococcus species NOT DETECTED NOT DETECTED Final   Streptococcus agalactiae NOT DETECTED NOT DETECTED Final   Streptococcus pneumoniae NOT DETECTED NOT DETECTED Final   Streptococcus pyogenes NOT DETECTED NOT DETECTED Final   Acinetobacter baumannii NOT DETECTED NOT DETECTED Final   Enterobacteriaceae species NOT DETECTED NOT DETECTED Final   Enterobacter cloacae complex NOT DETECTED NOT DETECTED Final   Escherichia coli NOT DETECTED NOT DETECTED Final   Klebsiella oxytoca NOT DETECTED NOT DETECTED Final   Klebsiella pneumoniae NOT DETECTED NOT DETECTED Final   Proteus species NOT DETECTED NOT DETECTED Final   Serratia marcescens NOT DETECTED NOT DETECTED Final   Carbapenem resistance NOT DETECTED NOT DETECTED Final   Haemophilus influenzae NOT DETECTED NOT DETECTED Final   Neisseria meningitidis NOT DETECTED NOT DETECTED Final   Pseudomonas aeruginosa NOT DETECTED NOT DETECTED Final   Candida albicans NOT DETECTED NOT DETECTED Final   Candida glabrata NOT DETECTED NOT DETECTED Final   Candida krusei NOT DETECTED NOT DETECTED Final   Candida  parapsilosis NOT DETECTED NOT DETECTED Final   Candida tropicalis NOT DETECTED NOT DETECTED Final    Comment: Performed at [facility]  Culture, blood (Routine x 2)     Status: Abnormal   Collection Time: 09/10/2015  1:50 PM  Result Value Ref Range Status   Specimen Description BLOOD RIGHT HAND  Final   Special Requests IN PEDIATRIC BOTTLE 3 CC  Final   Culture  Setup Time   Final    PED BOTTLE GRAM POSITIVE COCCI IN CLUSTERS CRITICAL RESULT CALLED TO, READ BACK BY AND VERIFIED WITH: N GLOGOVAC,PARMD AT 0735 08/17/15 BY L BENFIELD    Culture (A)  Final    STAPHYLOCOCCUS AUREUS SUSCEPTIBILITIES PERFORMED ON PREVIOUS CULTURE WITHIN THE LAST 5 DAYS. Performed at [facility]    Report Status 08/19/2015 FINAL  Final  Urine culture     Status: Abnormal   Collection Time: 08/19/2015  3:19 PM  Result Value Ref Range Status   Specimen Description URINE, CATHETERIZED  Final   Special Requests NONE  Final   Culture (A)  Final    <10,000 COLONIES/mL INSIGNIFICANT GROWTH Performed at [facility]    Report Status 08/17/2015 FINAL  Final  MRSA PCR Screening     Status: None   Collection Time: 08/15/2015  6:19 PM  Result Value Ref Range Status   MRSA by PCR NEGATIVE NEGATIVE Final    Comment:        The GeneXpert MRSA Assay (FDA approved for NASAL specimens only), is one component of a comprehensive MRSA colonization surveillance program. It is not intended to diagnose MRSA infection nor to guide or monitor treatment for MRSA infections.   Culture, blood (Routine X 2) w Reflex to ID Panel     Status: None (Preliminary result)   Collection Time: 08/17/15  2:20 PM  Result Value Ref Range Status   Specimen Description BLOOD RIGHT ANTECUBITAL  Final   Special Requests BOTTLES DRAWN AEROBIC AND ANAEROBIC 5CC  Final   Culture   Final    NO GROWTH < 24 HOURS Performed at [facility]    Report Status PENDING  Incomplete  Culture, blood (Routine X 2) w Reflex to ID  Panel     Status: None (Preliminary  result)   Collection Time: 08/17/15  2:20 PM  Result Value Ref Range Status   Specimen Description BLOOD RIGHT HAND  Final   Special Requests IN PEDIATRIC BOTTLE 4CC  Final   Culture  Setup Time   Final    GRAM POSITIVE COCCI IN CLUSTERS AEROBIC BOTTLE ONLY    Culture   Final    NO GROWTH < 24 HOURS Performed at [facility]    Report Status PENDING  Incomplete         Radiology Studies: No results found.      Scheduled Meds: . amiodarone  400 mg Oral BID  . antiseptic oral rinse  7 mL Mouth Rinse BID  . atorvastatin  20 mg Oral Daily  . budesonide (PULMICORT) nebulizer solution  0.25 mg Nebulization BID  . calcium-vitamin D  1 tablet Oral BID  .  ceFAZolin (ANCEF) IV  2 g Intravenous Q8H  . cholecalciferol  2,000 Units Oral Daily  . cycloSPORINE  1 drop Both Eyes BID  . DULoxetine  60 mg Oral Daily  . famotidine  20 mg Oral Q2000  . feeding supplement (PRO-STAT SUGAR FREE 64)  30 mL Oral BID  . fluconazole  100 mg Oral Daily  . [START ON 2015-08-21] hydrocortisone sod succinate (SOLU-CORTEF) inj  50 mg Intravenous Daily  . lactose free nutrition  237 mL Oral TID WC  . lamoTRIgine  150 mg Oral BID  . latanoprost  1 drop Right Eye QHS  . loratadine  10 mg Oral Daily  . memantine  5 mg Oral BID  . metoprolol tartrate  12.5 mg Oral BID  . omega-3 acid ethyl esters  2 g Oral BID  . senna  1 tablet Oral BID  . sodium chloride flush  3 mL Intravenous Q12H   Continuous Infusions: . sodium chloride 75 mL/hr at 08/19/15 0600     LOS: 3 days    Time spent: 40 minutes    Perel Hauschild, MD [facility] Pager [phone]   If 7PM-7AM, please contact night-coverage [email or web address] Password [email or web address] 08/19/2015, 10:38 AM

## 2015-08-19 NOTE — Progress Notes (Addendum)
Patient Profile: 82 y/o female nursing home resident with h/o atrial fibrillation, stroke, carotid stenosis s/p L CEA, HLD and chronic anticoagulation with Xarelto, admitted by IM for septic shock secondary to UTI. Cardiology consulted for concerns for bradycardia.   Subjective: More alert today. Less drowsy. RN by beside assisting patient with breakfast. No reported events overnight.   Objective: Vital signs in last 24 hours: Temp:  [97.3 F (36.3 C)-99.7 F (37.6 C)] 98.8 F (37.1 C) (06/06 0800) Pulse Rate:  [54-146] 120 (06/06 0800) Resp:  [15-27] 24 (06/06 0800) BP: (113-208)/(71-122) 168/78 mmHg (06/06 0800) SpO2:  [96 %-100 %] 96 % (06/06 0800) FiO2 (%):  [2 %] 2 % (06/05 1938) Weight:  [161 lb 2.5 oz (73.1 kg)] 161 lb 2.5 oz (73.1 kg) (06/06 0500) Last BM Date: 08/18/15  Intake/Output from previous day: 06/05 0701 - 06/06 0700 In: 2095 [I.V.:1795; IV Piggyback:300] Out: 825 [Urine:825] Intake/Output this shift: Total I/O In: 75 [I.V.:75] Out: -   Medications Current Facility-Administered Medications  Medication Dose Route Frequency Provider Last Rate Last Dose  . 0.9 %  sodium chloride infusion   Intravenous Continuous Eugenie Filler, MD 75 mL/hr at 08/19/15 0600    . acetaminophen (TYLENOL) tablet 650 mg  650 mg Oral Q6H PRN Eugenie Filler, MD   650 mg at 08/18/15 0001   Or  . acetaminophen (TYLENOL) suppository 650 mg  650 mg Rectal Q6H PRN Eugenie Filler, MD      . amiodarone (PACERONE) tablet 400 mg  400 mg Oral BID Eugenie Filler, MD   400 mg at 08/18/15 2132  . antiseptic oral rinse (CPC / CETYLPYRIDINIUM CHLORIDE 0.05%) solution 7 mL  7 mL Mouth Rinse BID Eugenie Filler, MD   7 mL at 08/18/15 2200  . atorvastatin (LIPITOR) tablet 20 mg  20 mg Oral Daily Eugenie Filler, MD   20 mg at 08/18/15 1100  . budesonide (PULMICORT) nebulizer solution 0.25 mg  0.25 mg Nebulization BID Eugenie Filler, MD   0.25 mg at 08/18/15 1938  .  calcium-vitamin D (OSCAL WITH D) 500-200 MG-UNIT per tablet 1 tablet  1 tablet Oral BID Eugenie Filler, MD   1 tablet at 08/18/15 2131  . ceFAZolin (ANCEF) IVPB 2g/100 mL premix  2 g Intravenous Q8H Donald Prose Runyon, RPH   2 g at 08/19/15 0407  . cholecalciferol (VITAMIN D) tablet 2,000 Units  2,000 Units Oral Daily Eugenie Filler, MD   2,000 Units at 08/18/15 1100  . clonazePAM (KLONOPIN) tablet 0.5 mg  0.5 mg Oral TID PRN Eugenie Filler, MD      . cycloSPORINE (RESTASIS) 0.05 % ophthalmic emulsion 1 drop  1 drop Both Eyes BID Eugenie Filler, MD   1 drop at 08/18/15 2200  . DULoxetine (CYMBALTA) DR capsule 60 mg  60 mg Oral Daily Eugenie Filler, MD   60 mg at 08/18/15 1100  . famotidine (PEPCID) tablet 20 mg  20 mg Oral Q2000 Eugenie Filler, MD   20 mg at 08/18/15 2022  . feeding supplement (PRO-STAT SUGAR FREE 64) liquid 30 mL  30 mL Oral BID Eugenie Filler, MD   30 mL at 08/17/15 2200  . fluconazole (DIFLUCAN) tablet 100 mg  100 mg Oral Daily Irine Seal V, MD   100 mg at 08/18/15 1123  . hydrALAZINE (APRESOLINE) injection 10 mg  10 mg Intravenous Q6H PRN Dianne Dun, NP   10 mg  at 08/19/15 0043  . [START ON 2015-09-14] hydrocortisone sodium succinate (SOLU-CORTEF) 100 MG injection 50 mg  50 mg Intravenous Daily Irine Seal V, MD      . ipratropium (ATROVENT) nebulizer solution 0.5 mg  0.5 mg Nebulization Q2H PRN Eugenie Filler, MD   0.5 mg at 08/19/15 0602  . lactose free nutrition (BOOST PLUS) liquid 237 mL  237 mL Oral TID WC Eugenie Filler, MD   237 mL at 08/17/15 1511  . lamoTRIgine (LAMICTAL) tablet 150 mg  150 mg Oral BID Eugenie Filler, MD   150 mg at 08/18/15 2131  . latanoprost (XALATAN) 0.005 % ophthalmic solution 1 drop  1 drop Right Eye QHS Eugenie Filler, MD   1 drop at 08/18/15 2132  . levalbuterol (XOPENEX) nebulizer solution 0.63 mg  0.63 mg Nebulization Q2H PRN Eugenie Filler, MD   0.63 mg at 08/19/15 0602  . loratadine  (CLARITIN) tablet 10 mg  10 mg Oral Daily Eugenie Filler, MD   10 mg at 08/18/15 1100  . meclizine (ANTIVERT) tablet 25 mg  25 mg Oral Q6H PRN Eugenie Filler, MD      . memantine [facility]) tablet 5 mg  5 mg Oral BID Eugenie Filler, MD   5 mg at 08/18/15 2132  . metoprolol tartrate (LOPRESSOR) tablet 12.5 mg  12.5 mg Oral BID Consuelo Pandy, PA-C   12.5 mg at 08/18/15 2131  . omega-3 acid ethyl esters (LOVAZA) capsule 2 g  2 g Oral BID Eugenie Filler, MD   2 g at 08/18/15 2131  . ondansetron (ZOFRAN) tablet 4 mg  4 mg Oral Q6H PRN Eugenie Filler, MD       Or  . ondansetron [facility]) injection 4 mg  4 mg Intravenous Q6H PRN Eugenie Filler, MD   4 mg at 08/18/15 0751  . potassium chloride SA (K-DUR,KLOR-CON) CR tablet 40 mEq  40 mEq Oral Once Eugenie Filler, MD      . senna [facility]) tablet 8.6 mg  1 tablet Oral BID Eugenie Filler, MD   8.6 mg at 08/18/15 2132  . senna-docusate (Senokot-S) tablet 1 tablet  1 tablet Oral QHS PRN Irine Seal V, MD      . sodium chloride flush (NS) 0.9 % injection 3 mL  3 mL Intravenous Q12H Eugenie Filler, MD   3 mL at 08/18/15 1127  . sodium phosphate (FLEET) 7-19 GM/118ML enema 1 enema  1 enema Rectal Once PRN Eugenie Filler, MD      . sorbitol 70 % solution 30 mL  30 mL Oral Daily PRN Eugenie Filler, MD        PE: General: Pleasant, NAD, alert Psych: Normal affect. Neuro: Alert and oriented X 3. Moves all extremities spontaneously. HEENT: Normal Neck: Supple without bruits or JVD. Lungs: Resp regular and unlabored, CTA. Heart: irregularly irregular, tachy rate no s3, s4, or murmurs. Abdomen: Soft, non-tender, non-distended, BS + x 4.  Extremities: No clubbing, cyanosis or edema. DP/PT/Radials 2+ and equal bilaterally.  Lab Results:   Recent Labs  08/17/15 0334 08/18/15 0354 08/19/15 0328  WBC 3.5* 5.0 8.5  HGB 10.6* 10.1* 10.7*  HCT 32.5* 30.8* 32.5*  PLT 68* 72* 111*   BMET  Recent Labs   08/17/15 0334 08/18/15 0354 08/19/15 0328  NA 138 137 139  K 4.1 3.8 3.4*  CL 112* 111 110  CO2 20* 20* 20*  GLUCOSE 117* 156*  123*  BUN 34* 22* 17  CREATININE 0.86 0.59 0.53  CALCIUM 7.1* 7.7* 8.1*   PT/INR  Recent Labs  09/06/2015 1818  LABPROT 26.9*  INR 2.64*   Hepatic Function Latest Ref Rng 08/18/2015 08/17/2015 09/11/2015  Total Protein 6.5 - 8.1 g/dL 5.5(L) 5.4(L) 5.3(L)  Albumin 3.5 - 5.0 g/dL 2.3(L) 2.2(L) 2.2(L)  AST 15 - 41 U/L 44(H) 61(H) 60(H)  ALT 14 - 54 U/L 32 33 30  Alk Phosphatase 38 - 126 U/L 75 73 64  Total Bilirubin 0.3 - 1.2 mg/dL 0.4 0.5 0.6   Lab Results  Component Value Date   TSH 1.960 09/05/2013    Studies/Results: 2D Echo 08/18/15 Study Conclusions  - Left ventricle: The cavity size was normal. There was mild  concentric hypertrophy. Systolic function was normal. The  estimated ejection fraction was in the range of 50% to 55%. Wall  motion was normal; there were no regional wall motion  abnormalities. The study is not technically sufficient to allow  evaluation of LV diastolic function. - Right ventricle: The cavity size was mildly dilated. Wall  thickness was normal. Systolic function was mildly reduced. - Tricuspid valve: There was no regurgitation. - Pulmonary arteries: Systolic pressure was within the normal  range. - Inferior vena cava: The vessel was normal in size. - Pericardium, extracardiac: A trivial pericardial effusion was  identified.  Impressions:  - The study acquired during an episode of a-fib with RVR. Left and  right ventricle appear to have mildly decreased systolic function  with mild diffuse hypokinesis.  This can be caused by tachycardia during the acquisition.  Assessment/Plan  Principal Problem:   Staphylococcus aureus bacteremia with sepsis (HCC) Active Problems:   Peripheral neuropathy (HCC)   Chronic back pain   Dyslipidemia   GERD (gastroesophageal reflux disease)   Constipation    Depression   Vascular dementia without behavioral disturbance   Hypotension   H/O: CVA (cerebrovascular accident)   AKI (acute kidney injury) ([place])   Atrial fibrillation with rapid ventricular response (HCC)   Hypokalemia   UTI (lower urinary tract infection)   Acute encephalopathy   Dehydration   Bacteremia: Probable   Thrombocytopenia (HCC)   Oral thrush   Obtundation   1. Atrial Fibrillation: HR remains poorly controlled in the 120s.  She was recently placed on PO amiodarone in outpatient clinic, per Dr. Oval Linsey, on 08/14/15. 400 mg BID. BP is stable with room to increase AV nodal blocking agent. Continue amiodarone and further increase her metoprolol.  She will also need correction of her hypokalemia with supplementation as electrolyte abnormalities may exacerbate her arrhthymia. She was on Xarelto for a/c, however this is now on hold due to thrombocytopenia. 2D echo shows normal LVEF at 50-55%   2. Concerns for Bradycardia: there were concerns for bradycardia, based on telemetry readings with reported HR in the 30s. Telemetry strips were reviewed by Dr. Claiborne Billings. No significant bradycardia/ pauses noted. Review of her telemetry strips reveal that she has extremely low voltage, but her atrial fibrillation rate is fast rather than slow with ventricular rate in the 100-120 range. Suspect the computer was not set at a sensitivity which will allow it to recognize the small QRS complexes.  It is ok to continue metoprolol and amiodarone.  3. UTI: on IV antibiotics. Management per IM.   4. Thrombocytopenia: Xarelto is on hold. Platelet count is improving 68>>72>>111.     LOS: 3 days    Brittainy M. Rosita Fire, PA-C 08/19/2015 8:27 AM  Patient seen and examined. Agree with assessment and plan.  The patient's heart rate is improved and now is in the 90-110 range.  She continues to be on amiodarone at 400 mg twice a day.  This was started on 08/14/15.  I will reduce this to 400 mg daily and further  titrate her beta blocker to metoprolol 25 mg twice a day.  She can be transferred out of the ICU setting to telemetry.  Her anticoagulation is currently on hold due to recent thrombocytopenia but her platelet count today has improved at 111,000.  If the patient has a fall risk or is felt that her platelet count precludes reinitiation of anticoagulation therapy, consider aspirin therapy. F/u ECG in am.  Her LFTs are improved, but AST remains slightly elevated at 44.  I would follow-up TSH, and CBC.   Troy Sine, MD, [facility] 08/19/2015 2:30 PM

## 2015-08-19 NOTE — Progress Notes (Signed)
PT Cancellation Note  Patient Details Name: Dorothy Collins MRN: [place]:[id number] DOB: 11-Mar-1931   Cancelled Treatment:    Reason Eval/Treat Not Completed: Fatigue/lethargy limiting ability to participate (patient in recliner via lift by nursing.. she looks and sound  very different  In respect to MS,coughing and lethargic.  She  Was apble to carry on converstion last visit with some confusion then. )   Claretha Cooper 08/19/2015, 3:32 PM Tresa Endo PT [phone]

## 2015-08-19 NOTE — Progress Notes (Signed)
CSW assisting with d/c planning. Pt plans to return to [facility] when stable for d/c. SNF has been contacted and updated. CSW will continue to follow to assist with d/c planning back to SNF.  Arsema Tusing LCW [phone]

## 2015-08-19 NOTE — Progress Notes (Signed)
ANTICOAGULATION CONSULT NOTE - Initial Consult  Pharmacy Consult for Xarelto Indication: atrial fibrillation  Allergies  Allergen Reactions  . Lyrica [Pregabalin]     Unknown; listed on MAR  . Morphine And Related     Unknown; listed on MAR  . Penicillins     Unknown; listed on MAR  . Sulfa Antibiotics     Unknown; listed on mar    Patient Measurements: Height: 5\' 7"  (170.2 cm) Weight: 161 lb 2.5 oz (73.1 kg) IBW/kg (Calculated) : 61.6  Vital Signs: Temp: 99.3 F (37.4 C) (06/06 1000) BP: 166/88 mmHg (06/06 1000) Pulse Rate: 121 (06/06 1000)  Labs:  Recent Labs  09/06/2015 1818 08/17/15 0334 08/18/15 0354 08/19/15 0328  HGB  --  10.6* 10.1* 10.7*  HCT  --  32.5* 30.8* 32.5*  PLT  --  68* 72* 111*  APTT 48*  --   --   --   LABPROT 26.9*  --   --   --   INR 2.64*  --   --   --   CREATININE  --  0.86 0.59 0.53    Estimated Creatinine Clearance: 52.7 mL/min (by C-G formula based on Cr of 0.53).   Medical History: Past Medical History  Diagnosis Date  . Dementia   . CVA (cerebral vascular accident) ([place])   . COPD (chronic obstructive pulmonary disease) (HCC)     Emphysema  . GERD (gastroesophageal reflux disease)   . Glaucoma   . Hyperlipidemia   . Insomnia   . Rhinitis 12/14/1999  . Depression 10/12/2009  . Cancer ([place]) 07/18/2008    Breast cancer-Left  . Hypertension   . Arthritis 07/18/2008    osteoporosis  . Peripheral neuropathy ([place]) 07/18/2008  . Cerebrovascular disease 08/05/2003    acute  . Lumbago 08/05/2003  . Edema 08/05/2003  . Cellulitis and abscess of leg 08/05/2003  . Peripheral vascular disease ([place]) 08/05/2003  . Anxiety 08/05/2003  . Hypopotassemia 08/05/2003  . Anemia     Medications:  Scheduled:  . amiodarone  400 mg Oral BID  . antiseptic oral rinse  7 mL Mouth Rinse BID  . atorvastatin  20 mg Oral Daily  . budesonide (PULMICORT) nebulizer solution  0.25 mg Nebulization BID  . calcium-vitamin D  1 tablet Oral BID  .  ceFAZolin  (ANCEF) IV  2 g Intravenous Q8H  . cholecalciferol  2,000 Units Oral Daily  . cycloSPORINE  1 drop Both Eyes BID  . DULoxetine  60 mg Oral Daily  . famotidine  20 mg Oral Q2000  . feeding supplement (PRO-STAT SUGAR FREE 64)  30 mL Oral BID  . fluconazole  100 mg Oral Daily  . [START ON Alexus 22, 2017] hydrocortisone sod succinate (SOLU-CORTEF) inj  50 mg Intravenous Daily  . lactose free nutrition  237 mL Oral TID WC  . lamoTRIgine  150 mg Oral BID  . latanoprost  1 drop Right Eye QHS  . loratadine  10 mg Oral Daily  . memantine  5 mg Oral BID  . metoprolol tartrate  12.5 mg Oral BID  . omega-3 acid ethyl esters  2 g Oral BID  . senna  1 tablet Oral BID  . sodium chloride flush  3 mL Intravenous Q12H   Infusions:  . sodium chloride 75 mL/hr at 08/19/15 0600   PRN: acetaminophen **OR** acetaminophen, clonazePAM, hydrALAZINE, ipratropium, levalbuterol, meclizine, ondansetron **OR** ondansetron (ZOFRAN) IV, senna-docusate, sodium phosphate, sorbitol  Assessment: 82 year old female nursing home resident with history of atrial fibrillation  on chronic anticoagulation, recurrent UTIs, dementia, depression, who presents to the ED in septic shock secondary to UTI and MSSA bacteremia.  Xarelto has been on hold since admit due to thrombocytopenia which has since improved.  Pharmacy is now consulted to resume since patient is in Afib with RVR currently on amiodarone and metoprolol per Cardiology.  Goal of Therapy:  Therapeutic anticoagulation Monitor platelets by anticoagulation protocol: Yes   Plan:   Resume Xarelto at lower dosage of 15mg  daily with supper given borderline CrCl, thrombocytopenia and interactions with amiodarone and fluconazole  Monitor renal function, CBC and signs/symptoms of bleeding or thrombosis   Peggyann Juba, PharmD, BCPS Pager: [phone] 08/19/2015,11:01 AM

## 2015-08-20 ENCOUNTER — Inpatient Hospital Stay (HOSPITAL_COMMUNITY): Payer: Medicare Other

## 2015-08-20 DIAGNOSIS — Z7189 Other specified counseling: Secondary | ICD-10-CM

## 2015-08-20 DIAGNOSIS — Z515 Encounter for palliative care: Secondary | ICD-10-CM

## 2015-08-20 DIAGNOSIS — R0600 Dyspnea, unspecified: Secondary | ICD-10-CM

## 2015-08-20 LAB — BASIC METABOLIC PANEL
ANION GAP: 11 (ref 5–15)
BUN: 16 mg/dL (ref 6–20)
CO2: 23 mmol/L (ref 22–32)
Calcium: 8.5 mg/dL — ABNORMAL LOW (ref 8.9–10.3)
Chloride: 107 mmol/L (ref 101–111)
Creatinine, Ser: 0.66 mg/dL (ref 0.44–1.00)
GFR calc Af Amer: >60 mL/min (ref >60)
Glucose, Bld: 156 mg/dL — ABNORMAL HIGH (ref 65–99)
POTASSIUM: 3 mmol/L — AB (ref 3.5–5.1)
SODIUM: 141 mmol/L (ref 135–145)

## 2015-08-20 LAB — CBC WITH DIFFERENTIAL/PLATELET
BASOS ABS: 0 10*3/uL (ref 0.0–0.1)
BASOS PCT: 0 %
EOS ABS: 0 10*3/uL (ref 0.0–0.7)
Eosinophils Relative: 0 %
HCT: 31.9 % — ABNORMAL LOW (ref 36.0–46.0)
HEMOGLOBIN: 10.6 g/dL — AB (ref 12.0–15.0)
LYMPHS PCT: 9 %
Lymphs Abs: 0.9 10*3/uL (ref 0.7–4.0)
MCH: 29.4 pg (ref 26.0–34.0)
MCHC: 33.2 g/dL (ref 30.0–36.0)
MCV: 88.6 fL (ref 78.0–100.0)
MONOS PCT: 8 %
Monocytes Absolute: 0.8 10*3/uL (ref 0.1–1.0)
NEUTROS PCT: 83 %
Neutro Abs: 8.1 10*3/uL — ABNORMAL HIGH (ref 1.7–7.7)
PLATELETS: 146 10*3/uL — AB (ref 150–400)
RBC: 3.6 MIL/uL — ABNORMAL LOW (ref 3.87–5.11)
RDW: 14.6 % (ref 11.5–15.5)
WBC: 9.8 10*3/uL (ref 4.0–10.5)

## 2015-08-20 LAB — GLUCOSE, CAPILLARY: Glucose-Capillary: 150 mg/dL — ABNORMAL HIGH (ref 65–99)

## 2015-08-20 LAB — TSH: TSH: 1.216 u[IU]/mL (ref 0.350–4.500)

## 2015-08-20 MED ORDER — GLYCOPYRROLATE 0.2 MG/ML IJ SOLN
0.2000 mg | INTRAMUSCULAR | Status: DC | PRN
  Filled 2015-08-20: qty 1

## 2015-08-20 MED ORDER — FUROSEMIDE 10 MG/ML IJ SOLN
40.0000 mg | Freq: Once | INTRAMUSCULAR | Status: AC
  Administered 2015-08-20 (×2): 40 mg via INTRAVENOUS
  Filled 2015-08-20: qty 4

## 2015-08-20 MED ORDER — KETOROLAC TROMETHAMINE 30 MG/ML IJ SOLN: 15.0000 mg | Freq: Three times a day (TID) | INTRAMUSCULAR | Status: DC | PRN

## 2015-08-20 MED ORDER — HALOPERIDOL LACTATE 5 MG/ML IJ SOLN: 0.5000 mg | INTRAMUSCULAR | Status: DC | PRN

## 2015-08-20 MED ORDER — AMIODARONE HCL IN DEXTROSE 360-4.14 MG/200ML-% IV SOLN
30.0000 mg/h | INTRAVENOUS | Status: DC
  Administered 2015-08-20: 30 mg/h via INTRAVENOUS
  Filled 2015-08-20 (×2): qty 200

## 2015-08-20 MED ORDER — FUROSEMIDE 10 MG/ML IJ SOLN
40.0000 mg | Freq: Once | INTRAMUSCULAR | Status: AC
  Administered 2015-08-20: 40 mg via INTRAVENOUS
  Filled 2015-08-20: qty 4

## 2015-08-20 MED ORDER — POTASSIUM CHLORIDE CRYS ER 20 MEQ PO TBCR: 40.0000 meq | EXTENDED_RELEASE_TABLET | Freq: Once | ORAL | Status: DC

## 2015-08-20 MED ORDER — FUROSEMIDE 10 MG/ML IJ SOLN: 40.0000 mg | Freq: Two times a day (BID) | INTRAMUSCULAR | Status: DC

## 2015-08-20 MED ORDER — METOPROLOL TARTRATE 5 MG/5ML IV SOLN
5.0000 mg | Freq: Once | INTRAVENOUS | Status: AC
  Administered 2015-08-20: 5 mg via INTRAVENOUS

## 2015-08-20 MED ORDER — AMIODARONE HCL IN DEXTROSE 360-4.14 MG/200ML-% IV SOLN
60.0000 mg/h | INTRAVENOUS | Status: DC
  Administered 2015-08-20 (×2): 60 mg/h via INTRAVENOUS
  Filled 2015-08-20 (×2): qty 200

## 2015-08-20 MED ORDER — LORAZEPAM 2 MG/ML PO CONC: 1.0000 mg | ORAL | Status: DC | PRN

## 2015-08-20 MED ORDER — HALOPERIDOL 0.5 MG PO TABS
0.5000 mg | ORAL_TABLET | ORAL | Status: DC | PRN
  Filled 2015-08-20: qty 1

## 2015-08-20 MED ORDER — MORPHINE BOLUS VIA INFUSION
2.0000 mg | INTRAVENOUS | Status: DC | PRN
  Filled 2015-08-20: qty 2

## 2015-08-20 MED ORDER — FUROSEMIDE 10 MG/ML IJ SOLN
INTRAMUSCULAR | Status: AC
  Administered 2015-08-20: 40 mg via INTRAVENOUS
  Filled 2015-08-20: qty 4

## 2015-08-20 MED ORDER — GLYCOPYRROLATE 1 MG PO TABS
1.0000 mg | ORAL_TABLET | ORAL | Status: DC | PRN
  Filled 2015-08-20: qty 1

## 2015-08-20 MED ORDER — MORPHINE SULFATE (PF) 2 MG/ML IV SOLN
2.0000 mg | INTRAVENOUS | Status: DC | PRN
  Administered 2015-08-20: 2 mg via INTRAVENOUS
  Filled 2015-08-20: qty 1

## 2015-08-20 MED ORDER — LORAZEPAM 2 MG/ML IJ SOLN: 1.0000 mg | INTRAMUSCULAR | Status: DC | PRN

## 2015-08-20 MED ORDER — LEVALBUTEROL HCL 1.25 MG/0.5ML IN NEBU
1.2500 mg | INHALATION_SOLUTION | Freq: Once | RESPIRATORY_TRACT | Status: AC
  Administered 2015-08-20: 1.25 mg via RESPIRATORY_TRACT
  Filled 2015-08-20 (×2): qty 0.5

## 2015-08-20 MED ORDER — LORAZEPAM 1 MG PO TABS: 1.0000 mg | ORAL_TABLET | ORAL | Status: DC | PRN

## 2015-08-20 MED ORDER — METOPROLOL TARTRATE 5 MG/5ML IV SOLN
INTRAVENOUS | Status: AC
  Filled 2015-08-20: qty 5

## 2015-08-20 MED ORDER — MORPHINE SULFATE (PF) 2 MG/ML IV SOLN
2.0000 mg | Freq: Once | INTRAVENOUS | Status: AC
Start: 2015-08-20 — End: 2015-08-20
  Administered 2015-08-20: 2 mg via INTRAVENOUS
  Filled 2015-08-20: qty 1

## 2015-08-20 MED ORDER — POTASSIUM CHLORIDE 10 MEQ/100ML IV SOLN
10.0000 meq | INTRAVENOUS | Status: DC
  Administered 2015-08-20: 10 meq via INTRAVENOUS
  Filled 2015-08-20: qty 100

## 2015-08-20 MED ORDER — KETOROLAC TROMETHAMINE 15 MG/ML IJ SOLN
INTRAMUSCULAR | Status: AC
Start: 2015-08-20 — End: 2015-08-20
  Administered 2015-08-20: 15 mg
  Filled 2015-08-20: qty 1

## 2015-08-20 MED ORDER — HALOPERIDOL LACTATE 2 MG/ML PO CONC
0.5000 mg | ORAL | Status: DC | PRN
  Filled 2015-08-20: qty 0.3

## 2015-08-20 MED ORDER — METOPROLOL TARTRATE 5 MG/5ML IV SOLN
5.0000 mg | Freq: Four times a day (QID) | INTRAVENOUS | Status: DC | PRN
  Administered 2015-08-20: 5 mg via INTRAVENOUS
  Filled 2015-08-20: qty 5

## 2015-08-20 MED ORDER — AMIODARONE LOAD VIA INFUSION
150.0000 mg | Freq: Once | INTRAVENOUS | Status: AC
  Administered 2015-08-20: 150 mg via INTRAVENOUS
  Filled 2015-08-20: qty 83.34

## 2015-08-20 MED ORDER — MORPHINE SULFATE 25 MG/ML IV SOLN
1.0000 mg/h | INTRAVENOUS | Status: DC
  Administered 2015-08-20 (×2): 2 mg/h via INTRAVENOUS
  Administered 2015-08-20: 1 mg/h via INTRAVENOUS
  Administered 2015-08-20: 2 mg/h via INTRAVENOUS
  Filled 2015-08-20: qty 10

## 2015-08-21 LAB — CULTURE, BLOOD (ROUTINE X 2)

## 2015-08-22 LAB — CULTURE, BLOOD (ROUTINE X 2): CULTURE: NO GROWTH

## 2015-08-24 LAB — CULTURE, BLOOD (ROUTINE X 2): CULTURE: NO GROWTH

## 2015-08-27 ENCOUNTER — Ambulatory Visit: Payer: Medicare Other | Admitting: Nurse Practitioner

## 2015-09-13 NOTE — Progress Notes (Signed)
Chaplain alerted to the pending death of Dorothy Collins. Chaplain was not present when Jacques Navy came and immediately left. Staff indicates that the family minister had visited this afternoon and that the family was at peace.  Sallee Lange. Alee Gressman, DMin, MDiv Chaplain

## 2015-09-13 NOTE — Progress Notes (Signed)
Pharmacy Antibiotic Note  Dorothy Collins is a 82 y.o. female from [facility] who presented to [facility] ED on 08/21/2015 with failure to thrive, increased lethargy x 2 days. Patient tachycardic, hypotensive, and febrile in the ED. Code sepsis initiated. Patient received doses of Vancomycin, Levaquin, and Aztreonam in the ED. Upon admission, antibiotics changed to Vancomycin and Cefepime for septic shock secondary to UTI.  BCID and cultures resulted with MSSA.  ID consulted and narrowed to cefazolin.  Plan:  Continue Cefazolin 2g IV q8h.  No further dosing adjustments anticipated, Pharmacy will sign off  Height: 5\' 7"  (170.2 cm) Weight: 162 lb (73.483 kg) IBW/kg (Calculated) : 61.6  Temp (24hrs), Avg:98.5 F (36.9 C), Min:97.7 F (36.5 C), Max:99.7 F (37.6 C)   Recent Labs Lab 09/06/2015 1255 08/28/2015 1322 09/10/2015 1543 09/06/2015 1556 08/17/15 0334 08/18/15 0354 08/19/15 0328 2015/09/16 0522  WBC 7.5  --   --   --  3.5* 5.0 8.5 9.8  CREATININE  --   --  1.30*  --  0.86 0.59 0.53 0.66  LATICACIDVEN  --  2.74*  --  1.37  --   --   --   --     Estimated Creatinine Clearance: 52.7 mL/min (by C-G formula based on Cr of 0.66).    Allergies  Allergen Reactions  . Lyrica [Pregabalin]     Unknown; listed on MAR  . Penicillins     Unknown; listed on MAR  . Sulfa Antibiotics     Unknown; listed on mar    Antimicrobials this admission: 6/3 >> Levaquin x 1 6/3 >> Aztreonam x 1 6/3 >> Vancomycin >> 6/4 6/3 >> Cefepime >> 6/4 6/4 >> Cefazolin >> 6/4 >> Fluconazole (thrush) >> (6/10)  Dose adjustments this admission: --  Microbiology results: 6/3 BCx: 2/2 MSSA; BCID staph aureus (No methicillin resistance) 6/3 UCx: <10k insignificant growth 6/3 MRSA PCR: neg 6/4 BCx: 1 of 2 MSSA 6/6 BCx: sent  Thank you for allowing pharmacy to be a part of this patient's care.  Peggyann Juba, PharmD, BCPS Pager: [phone]  2015/09/16 11:31 AM

## 2015-09-13 NOTE — Progress Notes (Addendum)
Throughout the shift, patient's HR starting climbing up into the 130's-140's a. fib. BP 158/87. Patient asymptomatic. Scheduled lopressor had already been given and HR was not going down. Hospitalist was made aware and told RN to call cardiology. Cardiology called back and gave orders to start an amio drip starting with a bolus. Drip was started with a second Therapist, sports. Will continue to monitor patient.

## 2015-09-13 NOTE — Discharge Summary (Signed)
Death Summary  Dorothy Collins [id number] DOB: 10-29-1930 DOA: 08-21-15  PCP: Wenda Low, MD  Admit date: [place] 08, 2017 Date of Death: 2015/08/28 Time of Death: overnight.  Notification: Wenda Low, MD notified of death of 2015/08/28   History of present illness:  Dorothy Collins is a 82 y.o. female with a history of  with medical history significant of atrial fibrillation on chronic anticoagulation, history of CVA, history of carotid stenosis status post left CEA, hyperlipidemia, dementia, history of recurrent UTIs.   [name] presented with complaint of who presented from nursing facility with increased lethargy 2 days and patient also noted to be hypotensive. Patient admitted with sepsis, MSSA Bacteremia, A fib RVR. Patient received appropiate treatment for sepsis, with IV fluids, IV antibiotics. She was also on IV amiodarone for A fib RVR. The night of 6-06 she develops Acute respiratory failure. She was treated with IV lasix for heart failure exacerbation. She was also started on BIPAP. Palliative care was consulted. After discussion with family patient was made full comfort care and she was started on Morphine gtt.   [name] did not improve after treatment for sepsis, bacteremia, Heart failure and A fib.   Final Diagnoses:    Staphylococcus aureus bacteremia with sepsis ([place])   Acute Hypoxic Respiratory Failure.    A fib RVR   Acute diastolic HF exacerbation    Acute metabolic encephalopathy  Peripheral neuropathy (HCC)  Chronic back pain  Dyslipidemia  GERD (gastroesophageal reflux disease)  Constipation  Depression  Vascular dementia without behavioral disturbance  Hypotension  H/O: CVA (cerebrovascular accident)  AKI (acute kidney injury) ([place])  Atrial fibrillation with rapid ventricular response (HCC)  Hypokalemia  UTI (lower urinary tract infection)  Acute encephalopathy  Dehydration  Thrombocytopenia (HCC)  Oral thrush   Obtundation   The results of significant diagnostics from this hospitalization (including imaging, microbiology, ancillary and laboratory) are listed below for reference.    Significant Diagnostic Studies: Dg Chest 2 View  08-21-2015  CLINICAL DATA:  Fever, lob lethargy and hypotension for 2 days. EXAM: CHEST  2 VIEW COMPARISON:  06/02/2015 FINDINGS: Mild cardiomegaly. Heart and mediastinal contours are within normal limits. No focal opacities or effusions. No acute bony abnormality. IMPRESSION: Mild cardiomegaly.  No active cardiopulmonary disease. Electronically Signed   By: Rolm Baptise M.D.   On: 2015-08-21 13:40   Dg Chest Port 1 View  08/25/15  CLINICAL DATA:  82 year old female with shortness of breath EXAM: PORTABLE CHEST 1 VIEW COMPARISON:  Chest radiograph dated 08/17/2015 FINDINGS: Single portable view of the chest demonstrates mild cardiomegaly with increased vascular prominence compatible with mild congestive changes. Bibasilar hazy densities may represent atelectatic changes or pneumonia. Clinical correlation is recommended. Trace bilateral pleural effusions may be present. There is atherosclerotic calcification of the aortic arch. No acute osseous pathology identified. IMPRESSION: Mild cardiomegaly and congestive changes. Bibasilar atelectasis versus pneumonia. Electronically Signed   By: Anner Crete M.D.   On: 25-Aug-2015 02:32   Dg Chest Port 1 View  08/17/2015  CLINICAL DATA:  Sepsis.  Emphysema. EXAM: PORTABLE CHEST 1 VIEW COMPARISON:  [place] 08, 2017 FINDINGS: Heart is upper limits normal in size. No confluent airspace opacities. No effusions or acute bony abnormality. IMPRESSION: No active disease. Electronically Signed   By: Rolm Baptise M.D.   On: 08/17/2015 07:11    Microbiology: Recent Results (from the past 240 hour(s))  Culture, blood (Routine x 2)     Status: Abnormal   Collection Time: 08-21-15  1:10 PM  Result Value Ref Range Status   Specimen Description BLOOD LEFT  ANTECUBITAL  Final   Special Requests BOTTLES DRAWN AEROBIC AND ANAEROBIC 1 CC EACH  Final   Culture  Setup Time   Final    IN BOTH AEROBIC AND ANAEROBIC BOTTLES GRAM POSITIVE COCCI IN CLUSTERS CRITICAL RESULT CALLED TO, READ BACK BY AND VERIFIED WITH: N GLOGOVAC,PHARMD AT [id number] 08/17/15 BY L BENFIELD Performed at [place] (A)  Final   Report Status 08/19/2015 FINAL  Final   Organism ID, Bacteria STAPHYLOCOCCUS AUREUS  Final      Susceptibility   Staphylococcus aureus - MIC*    CIPROFLOXACIN <=0.5 SENSITIVE Sensitive     ERYTHROMYCIN <=0.25 SENSITIVE Sensitive     GENTAMICIN <=0.5 SENSITIVE Sensitive     OXACILLIN 0.5 SENSITIVE Sensitive     TETRACYCLINE <=1 SENSITIVE Sensitive     VANCOMYCIN 1 SENSITIVE Sensitive     TRIMETH/SULFA <=10 SENSITIVE Sensitive     CLINDAMYCIN <=0.25 SENSITIVE Sensitive     RIFAMPIN <=0.5 SENSITIVE Sensitive     Inducible Clindamycin NEGATIVE Sensitive     * STAPHYLOCOCCUS AUREUS  Blood Culture ID Panel (Reflexed)     Status: Abnormal   Collection Time: 08/22/2015  1:10 PM  Result Value Ref Range Status   Enterococcus species NOT DETECTED NOT DETECTED Final   Vancomycin resistance NOT DETECTED NOT DETECTED Final   Listeria monocytogenes NOT DETECTED NOT DETECTED Final   Staphylococcus species DETECTED (A) NOT DETECTED Final    Comment: CRITICAL RESULT CALLED TO, READ BACK BY AND VERIFIED WITH: N GLOGOVAC,PHARMD AT 0735 08/17/15 BY L BENFIELD    Staphylococcus aureus DETECTED (A) NOT DETECTED Final    Comment: CRITICAL RESULT CALLED TO, READ BACK BY AND VERIFIED WITH: N GLOGOVAC,PHARMD AT 0735 08/17/15 BY L BENFIELD    Methicillin resistance NOT DETECTED NOT DETECTED Final   Streptococcus species NOT DETECTED NOT DETECTED Final   Streptococcus agalactiae NOT DETECTED NOT DETECTED Final   Streptococcus pneumoniae NOT DETECTED NOT DETECTED Final   Streptococcus pyogenes NOT DETECTED NOT DETECTED Final    Acinetobacter baumannii NOT DETECTED NOT DETECTED Final   Enterobacteriaceae species NOT DETECTED NOT DETECTED Final   Enterobacter cloacae complex NOT DETECTED NOT DETECTED Final   Escherichia coli NOT DETECTED NOT DETECTED Final   Klebsiella oxytoca NOT DETECTED NOT DETECTED Final   Klebsiella pneumoniae NOT DETECTED NOT DETECTED Final   Proteus species NOT DETECTED NOT DETECTED Final   Serratia marcescens NOT DETECTED NOT DETECTED Final   Carbapenem resistance NOT DETECTED NOT DETECTED Final   Haemophilus influenzae NOT DETECTED NOT DETECTED Final   Neisseria meningitidis NOT DETECTED NOT DETECTED Final   Pseudomonas aeruginosa NOT DETECTED NOT DETECTED Final   Candida albicans NOT DETECTED NOT DETECTED Final   Candida glabrata NOT DETECTED NOT DETECTED Final   Candida krusei NOT DETECTED NOT DETECTED Final   Candida parapsilosis NOT DETECTED NOT DETECTED Final   Candida tropicalis NOT DETECTED NOT DETECTED Final    Comment: Performed at [facility]  Culture, blood (Routine x 2)     Status: Abnormal   Collection Time: 08/23/2015  1:50 PM  Result Value Ref Range Status   Specimen Description BLOOD RIGHT HAND  Final   Special Requests IN PEDIATRIC BOTTLE 3 CC  Final   Culture  Setup Time   Final    PED BOTTLE GRAM POSITIVE COCCI IN CLUSTERS CRITICAL RESULT CALLED TO, READ BACK BY AND VERIFIED WITH:  N GLOGOVAC,PARMD AT 0735 08/17/15 BY L BENFIELD    Culture (A)  Final    STAPHYLOCOCCUS AUREUS SUSCEPTIBILITIES PERFORMED ON PREVIOUS CULTURE WITHIN THE LAST 5 DAYS. Performed at [facility]    Report Status 08/19/2015 FINAL  Final  Urine culture     Status: Abnormal   Collection Time: 09/01/2015  3:19 PM  Result Value Ref Range Status   Specimen Description URINE, CATHETERIZED  Final   Special Requests NONE  Final   Culture (A)  Final    <10,000 COLONIES/mL INSIGNIFICANT GROWTH Performed at [facility]    Report Status 08/17/2015 FINAL  Final  MRSA PCR  Screening     Status: None   Collection Time: 09/04/2015  6:19 PM  Result Value Ref Range Status   MRSA by PCR NEGATIVE NEGATIVE Final    Comment:        The GeneXpert MRSA Assay (FDA approved for NASAL specimens only), is one component of a comprehensive MRSA colonization surveillance program. It is not intended to diagnose MRSA infection nor to guide or monitor treatment for MRSA infections.   Culture, blood (Routine X 2) w Reflex to ID Panel     Status: None   Collection Time: 08/17/15  2:20 PM  Result Value Ref Range Status   Specimen Description BLOOD RIGHT ANTECUBITAL  Final   Special Requests BOTTLES DRAWN AEROBIC AND ANAEROBIC 5CC  Final   Culture   Final    NO GROWTH 5 DAYS Performed at [facility]    Report Status 08/22/2015 FINAL  Final  Culture, blood (Routine X 2) w Reflex to ID Panel     Status: Abnormal   Collection Time: 08/17/15  2:20 PM  Result Value Ref Range Status   Specimen Description BLOOD RIGHT HAND  Final   Special Requests IN PEDIATRIC BOTTLE 4CC  Final   Culture  Setup Time   Final    GRAM POSITIVE COCCI IN CLUSTERS AEROBIC BOTTLE ONLY CRITICAL RESULT CALLED TO, READ BACK BY AND VERIFIED WITH: A. RUNYON, PHARMACY AT 1054 ON [id number] BY Rhea Bleacher    Culture (A)  Final    STAPHYLOCOCCUS AUREUS SUSCEPTIBILITIES PERFORMED ON PREVIOUS CULTURE WITHIN THE LAST 5 DAYS. Performed at [facility]    Report Status 08/21/2015 FINAL  Final  Culture, blood (routine x 2)     Status: Abnormal   Collection Time: 08/19/15  2:23 PM  Result Value Ref Range Status   Specimen Description BLOOD RIGHT HAND  Final   Special Requests IN PEDIATRIC BOTTLE 5 CC  Final   Culture  Setup Time   Final    GRAM POSITIVE COCCI IN CLUSTERS IN PEDIATRIC BOTTLE CRITICAL RESULT CALLED TO, READ BACK BY AND VERIFIED WITH: Sheffield Slider, PHARM D AT 1351 ON [id number] BY Rhea Bleacher    Culture (A)  Final    STAPHYLOCOCCUS AUREUS SUSCEPTIBILITIES PERFORMED ON PREVIOUS  CULTURE WITHIN THE LAST 5 DAYS. Performed at [facility]    Report Status 08/22/2015 FINAL  Final  Culture, blood (routine x 2)     Status: None (Preliminary result)   Collection Time: 08/19/15  2:54 PM  Result Value Ref Range Status   Specimen Description BLOOD RIGHT ARM  Final   Special Requests IN PEDIATRIC BOTTLE 2CC  Final   Culture   Final    NO GROWTH 4 DAYS Performed at [facility]    Report Status PENDING  Incomplete     Labs: Basic Metabolic  Panel:  Recent Labs Lab 08/17/2015 1543 08/18/2015 1835 08/17/15 0334 08/18/15 0354 08/19/15 0328 09/14/15 0522  NA 132*  --  138 137 139 141  K 3.2*  --  4.1 3.8 3.4* 3.0*  CL 102  --  112* 111 110 107  CO2 21*  --  20* 20* 20* 23  GLUCOSE 167*  --  117* 156* 123* 156*  BUN 50*  --  34* 22* 17 16  CREATININE 1.30*  --  0.86 0.59 0.53 0.66  CALCIUM 7.3*  --  7.1* 7.7* 8.1* 8.5*  MG  --  1.6*  --  2.3  --   --    Liver Function Tests:  Recent Labs Lab 08/25/2015 1543 08/17/15 0334 08/18/15 0354  AST 60* 61* 44*  ALT 30 33 32  ALKPHOS 64 73 75  BILITOT 0.6 0.5 0.4  PROT 5.3* 5.4* 5.5*  ALBUMIN 2.2* 2.2* 2.3*   No results for input(s): LIPASE, AMYLASE in the last 168 hours. No results for input(s): AMMONIA in the last 168 hours. CBC:  Recent Labs Lab 08/17/15 0334 08/18/15 0354 08/19/15 0328 09-14-2015 0522  WBC 3.5* 5.0 8.5 9.8  NEUTROABS 3.1 4.1  --  8.1*  HGB 10.6* 10.1* 10.7* 10.6*  HCT 32.5* 30.8* 32.5* 31.9*  MCV 92.1 88.8 90.0 88.6  PLT 68* 72* 111* 146*   Cardiac Enzymes: No results for input(s): CKTOTAL, CKMB, CKMBINDEX, TROPONINI in the last 168 hours. D-Dimer No results for input(s): DDIMER in the last 72 hours. BNP: Invalid input(s): POCBNP CBG:  Recent Labs Lab 08/17/15 0800 08/18/15 0649 08/18/15 0736 08/19/15 0730 2015-09-14 0751  GLUCAP 110* 155* 159* 119* 150*   Anemia work up No results for input(s): VITAMINB12, FOLATE, FERRITIN, TIBC, IRON, RETICCTPCT in the last  72 hours. Urinalysis    Component Value Date/Time   COLORURINE AMBER* 08/25/2015 1519   APPEARANCEUR CLOUDY* 08/26/2015 1519   LABSPEC 1.021 09/05/2015 1519   PHURINE 7.5 08/19/2015 1519   GLUCOSEU NEGATIVE 09/12/2015 1519   HGBUR MODERATE* 09/08/2015 1519   BILIRUBINUR NEGATIVE 09/11/2015 1519   KETONESUR NEGATIVE 09/01/2015 1519   PROTEINUR 30* 09/07/2015 1519   UROBILINOGEN 0.2 08/16/2013 1635   NITRITE NEGATIVE 08/31/2015 1519   LEUKOCYTESUR LARGE* 09/12/2015 1519   Sepsis Labs Invalid input(s): PROCALCITONIN,  WBC,  LACTICIDVEN     SIGNED:  Elmarie Shiley, MD  [facility] 08/23/2015, 2:51 PM    If 7PM-7AM, please contact night-coverage [email or web address] Password [email or web address]

## 2015-09-13 NOTE — Progress Notes (Signed)
Patient Profile: 82 y/o female nursing home resident with h/o atrial fibrillation, stroke, carotid stenosis s/p L CEA, HLD and chronic anticoagulation with Xarelto, admitted by IM for septic shock secondary to UTI. Cardiology has been following for atrial fibrillation with RVR.  Interval History:  Patient's condition has deteriorated>> worsening respiratory failure requiring transfer back to ICU for Bipap, now with bacteriemia>> blood cultures + for MSSA. Palliative consult placed.     Subjective: Worsening respiratory status. No on BiPAP.   Objective: Vital signs in last 24 hours: Temp:  [98.2 F (36.8 C)-99.7 F (37.6 C)] 99.7 F (37.6 C) (06/07 1030) Pulse Rate:  [94-136] 113 (06/07 1244) Resp:  [17-28] 17 (06/07 1244) BP: (138-175)/(81-103) 175/103 mmHg (06/07 1015) SpO2:  [89 %-98 %] 98 % (06/07 1244) FiO2 (%):  [40 %] 40 % (06/07 1244) Weight:  [162 lb (73.483 kg)] 162 lb (73.483 kg) (06/07 0510) Last BM Date: 08/19/15  Intake/Output from previous day: 06/06 0701 - 06/07 0700 In: 1620 [P.O.:420; I.V.:900; IV Piggyback:300] Out: 3545 [Urine:3545] Intake/Output this shift: Total I/O In: -  Out: 1500 [Urine:1500]  Medications Current Facility-Administered Medications  Medication Dose Route Frequency Provider Last Rate Last Dose  . acetaminophen (TYLENOL) tablet 650 mg  650 mg Oral Q6H PRN Eugenie Filler, MD   650 mg at 08/18/15 0001   Or  . acetaminophen (TYLENOL) suppository 650 mg  650 mg Rectal Q6H PRN Eugenie Filler, MD   650 mg at 09/13/15 0942  . amiodarone (NEXTERONE PREMIX) 360-4.14 MG/200ML-% (1.8 mg/mL) IV infusion  30 mg/hr Intravenous Continuous Jolaine Artist, MD 16.7 mL/hr at 09-13-15 0720 30 mg/hr at 13-Sep-2015 0720  . amiodarone (PACERONE) tablet 400 mg  400 mg Oral Daily Troy Sine, MD      . antiseptic oral rinse (CPC / CETYLPYRIDINIUM CHLORIDE 0.05%) solution 7 mL  7 mL Mouth Rinse BID Eugenie Filler, MD   7 mL at 08/18/15 2200  .  atorvastatin (LIPITOR) tablet 20 mg  20 mg Oral Daily Eugenie Filler, MD   20 mg at 08/19/15 1009  . budesonide (PULMICORT) nebulizer solution 0.25 mg  0.25 mg Nebulization BID Eugenie Filler, MD   0.25 mg at 13-Sep-2015 0856  . calcium-vitamin D (OSCAL WITH D) 500-200 MG-UNIT per tablet 1 tablet  1 tablet Oral BID Eugenie Filler, MD   1 tablet at 08/19/15 2206  . ceFAZolin (ANCEF) IVPB 2g/100 mL premix  2 g Intravenous Q8H Donald Prose Runyon, RPH   2 g at 09/13/15 1313  . cholecalciferol (VITAMIN D) tablet 2,000 Units  2,000 Units Oral Daily Eugenie Filler, MD   2,000 Units at 08/19/15 1009  . clonazePAM (KLONOPIN) tablet 0.5 mg  0.5 mg Oral TID PRN Eugenie Filler, MD   0.5 mg at 08/19/15 2226  . cycloSPORINE (RESTASIS) 0.05 % ophthalmic emulsion 1 drop  1 drop Both Eyes BID Eugenie Filler, MD   1 drop at 2015/09/13 1239  . DULoxetine (CYMBALTA) DR capsule 60 mg  60 mg Oral Daily Eugenie Filler, MD   60 mg at 08/19/15 1002  . famotidine (PEPCID) tablet 20 mg  20 mg Oral Q2000 Eugenie Filler, MD   20 mg at 08/19/15 2203  . feeding supplement (PRO-STAT SUGAR FREE 64) liquid 30 mL  30 mL Oral BID Eugenie Filler, MD   30 mL at 08/19/15 2151  . furosemide (LASIX) injection 40 mg  40 mg Intravenous Q12H Belkys  A Regalado, MD      . hydrALAZINE (APRESOLINE) injection 10 mg  10 mg Intravenous Q6H PRN Dianne Dun, NP   10 mg at 09-10-2015 1422  . hydrocortisone sodium succinate (SOLU-CORTEF) 100 MG injection 50 mg  50 mg Intravenous Daily Eugenie Filler, MD   50 mg at 2015-09-10 1155  . ipratropium (ATROVENT) nebulizer solution 0.5 mg  0.5 mg Nebulization Q2H PRN Eugenie Filler, MD   0.5 mg at 08/19/15 0602  . lactose free nutrition (BOOST PLUS) liquid 237 mL  237 mL Oral TID WC Eugenie Filler, MD   237 mL at 08/17/15 1511  . lamoTRIgine (LAMICTAL) tablet 150 mg  150 mg Oral BID Eugenie Filler, MD   150 mg at 08/19/15 2325  . latanoprost (XALATAN) 0.005 % ophthalmic  solution 1 drop  1 drop Right Eye QHS Eugenie Filler, MD   1 drop at 08/19/15 2150  . levalbuterol (XOPENEX) nebulizer solution 0.63 mg  0.63 mg Nebulization Q2H PRN Eugenie Filler, MD   0.63 mg at 09-10-2015 0120  . loratadine (CLARITIN) tablet 10 mg  10 mg Oral Daily Eugenie Filler, MD   10 mg at 08/19/15 1009  . meclizine (ANTIVERT) tablet 25 mg  25 mg Oral Q6H PRN Eugenie Filler, MD      . memantine [facility]) tablet 5 mg  5 mg Oral BID Eugenie Filler, MD   5 mg at 08/19/15 2203  . metoprolol tartrate (LOPRESSOR) tablet 25 mg  25 mg Oral BID Consuelo Pandy, PA-C   25 mg at 08/19/15 2203  . morphine 2 MG/ML injection 2 mg  2 mg Intravenous Q3H PRN Belkys A Regalado, MD   2 mg at 09-10-2015 1325  . omega-3 acid ethyl esters (LOVAZA) capsule 2 g  2 g Oral BID Eugenie Filler, MD   2 g at 08/19/15 2203  . ondansetron (ZOFRAN) tablet 4 mg  4 mg Oral Q6H PRN Eugenie Filler, MD       Or  . ondansetron [facility]) injection 4 mg  4 mg Intravenous Q6H PRN Eugenie Filler, MD   4 mg at 08/18/15 0751  . potassium chloride SA (K-DUR,KLOR-CON) CR tablet 40 mEq  40 mEq Oral Once Belkys A Regalado, MD      . Rivaroxaban (XARELTO) tablet 15 mg  15 mg Oral Q supper Emiliano Dyer, RPH   15 mg at 08/19/15 1654  . senna (SENOKOT) tablet 8.6 mg  1 tablet Oral BID Eugenie Filler, MD   8.6 mg at 08/19/15 2204  . senna-docusate (Senokot-S) tablet 1 tablet  1 tablet Oral QHS PRN Irine Seal V, MD      . sodium chloride flush (NS) 0.9 % injection 3 mL  3 mL Intravenous Q12H Eugenie Filler, MD   3 mL at Neftali 28, 2017 1202  . sodium phosphate (FLEET) 7-19 GM/118ML enema 1 enema  1 enema Rectal Once PRN Eugenie Filler, MD      . sorbitol 70 % solution 30 mL  30 mL Oral Daily PRN Eugenie Filler, MD        PE: General: lethargic, on bipap Psych: Normal affect. Neuro: Alert and oriented X 3. Moves all extremities spontaneously. HEENT: Normal Neck: Supple without bruits or  JVD. Lungs: Resp regular and unlabored, on bipap Heart: irregularly irregular, tachy rate no s3, s4, or murmurs. Abdomen: Soft, non-tender, non-distended, BS + x 4.  Extremities: No clubbing, cyanosis  or edema. DP/PT/Radials 2+ and equal bilaterally.  Lab Results:   Recent Labs  08/18/15 0354 08/19/15 0328 21-Aug-2015 0522  WBC 5.0 8.5 9.8  HGB 10.1* 10.7* 10.6*  HCT 30.8* 32.5* 31.9*  PLT 72* 111* 146*   BMET  Recent Labs  08/18/15 0354 08/19/15 0328 08/21/15 0522  NA 137 139 141  K 3.8 3.4* 3.0*  CL 111 110 107  CO2 20* 20* 23  GLUCOSE 156* 123* 156*  BUN 22* 17 16  CREATININE 0.59 0.53 0.66  CALCIUM 7.7* 8.1* 8.5*   PT/INR No results for input(s): LABPROT, INR in the last 72 hours. Hepatic Function Latest Ref Rng 08/18/2015 08/17/2015 09/02/2015  Total Protein 6.5 - 8.1 g/dL 2.3(F) 5.7(D) 5.3(L)  Albumin 3.5 - 5.0 g/dL 2.3(L) 2.2(L) 2.2(L)  AST 15 - 41 U/L 44(H) 61(H) 60(H)  ALT 14 - 54 U/L 32 33 30  Alk Phosphatase 38 - 126 U/L 75 73 64  Total Bilirubin 0.3 - 1.2 mg/dL 0.4 0.5 0.6   Lab Results  Component Value Date   TSH 1.216 2015/08/21    Studies/Results: 2D Echo 08/18/15 Study Conclusions  - Left ventricle: The cavity size was normal. There was mild  concentric hypertrophy. Systolic function was normal. The  estimated ejection fraction was in the range of 50% to 55%. Wall  motion was normal; there were no regional wall motion  abnormalities. The study is not technically sufficient to allow  evaluation of LV diastolic function. - Right ventricle: The cavity size was mildly dilated. Wall  thickness was normal. Systolic function was mildly reduced. - Tricuspid valve: There was no regurgitation. - Pulmonary arteries: Systolic pressure was within the normal  range. - Inferior vena cava: The vessel was normal in size. - Pericardium, extracardiac: A trivial pericardial effusion was  identified.  Impressions:  - The study acquired during an  episode of a-fib with RVR. Left and  right ventricle appear to have mildly decreased systolic function  with mild diffuse hypokinesis.  This can be caused by tachycardia during the acquisition.  Assessment/Plan  Principal Problem:   Staphylococcus aureus bacteremia with sepsis (HCC) Active Problems:   Peripheral neuropathy (HCC)   Chronic back pain   Dyslipidemia   GERD (gastroesophageal reflux disease)   Constipation   Depression   Vascular dementia without behavioral disturbance   Hypotension   H/O: CVA (cerebrovascular accident)   AKI (acute kidney injury) (HCC)   Atrial fibrillation with rapid ventricular response (HCC)   Hypokalemia   UTI (lower urinary tract infection)   Acute encephalopathy   Dehydration   Thrombocytopenia (HCC)   Oral thrush   Obtundation   1. Atrial Fibrillation: HR remains poorly controlled in the 120s, likely due to infection.  Her first set of repeat blood cultures on 08/17/2015 are growing MSSA. ID following and she is on IV antibiotics. She was recently placed on PO amiodarone in outpatient clinic, per Dr. Duke Salvia, on 08/14/15, 400 mg BID. Now with deteriorating health with bacteremia and worsening respiratory function, requiring Bipap. Not taking orals so not getting metoprolol, Xarelto nor supplemental K. She is on IV amiodarone. Can use PRN IV metoprolol if needed. Agree with palliative care consult.   2. Concerns for Bradycardia: there were concerns for bradycardia, based on telemetry readings with reported HR in the 30s. Telemetry strips were reviewed by Dr. Tresa Endo. No significant bradycardia/ pauses noted. Review of her telemetry strips reveal that she has extremely low voltage, but her atrial fibrillation rate is fast  rather than slow with ventricular rate in the 100-120 range. Suspect the computer was not set at a sensitivity which will allow it to recognize the small QRS complexes.  It is ok to continue  Amiodarone. Metoprolol is on hold  given patient is not taking PO.   3. UTI: on IV antibiotics. Management per IM.   4. Thrombocytopenia: Xarelto is on hold. Platelet count is improving 68>>72>>111.     LOS: 4 days    Brittainy M. Ladoris Gene 09/10/2015 2:22 PM   Patient seen and examined. Agree with assessment and plan. Pt was transferred to 1222 to initiate palliative care. T 105.3.  To start morphine drip per palliative care team and withdraw BiPAP. Amiodarone dc.   Troy Sine, MD, [facility] 09/10/15 4:51 PM

## 2015-09-13 NOTE — Consult Note (Signed)
Consultation Note Date: Sep 11, 2015   Patient Name: Dorothy Collins  DOB: 10/01/1930  MRN: [id number]  Age / Sex: 82 y.o., female  PCP: Wenda Low, MD Referring Physician: Elmarie Shiley, MD  Reason for Consultation: Establishing goals of care  HPI/Patient Profile: 82 y.o. female  with past medical history of atrial fibrillation on chronic anticoagulation, recurrent UTIs, dementia, depression admitted on 08/15/2015 with septic shock secondary to MSSA bacteremia as well as A. fib with RVR with continued elevated heart rate. Palliative consulted for goals of care.  Clinical Assessment and Goals of Care: I saw and examined Dorothy Collins. She had no meaningful response during my examination was unable to participate in conversation.  I called and spoke with her daughter, Jeani Hawking. She reports she is also her mother's healthcare power of attorney.  Jeani Hawking reports that her mother has been a private person who did not place a strong emphasis on family in the past. Beginning in 2004 she moved into a skilled facility where she has lived since that point in time. She does not have a close relationship with her grandchildren. Her interests included showing dogs, bingo, gambling, and country music. She has been married 4 times. She still has a friendly relationship with her third husband who came to visit her this hospitalization. He spoke with Jeani Hawking and told her that based upon his interaction with her earlier today, he feels that she is dying.  Jeani Hawking reports that the doctors have been doing a good job explaining things to her and she understands the severity of her mother's condition. She reports that her mother had been clear that she would not want aggressive measures at the end of her life. We reviewed her clinical course to this point in time as well as pathways forward including continuing with current care versus refocusing care  strictly on measures for comfort. She reports that if her mother were to understand her current situation, her wishes would be to focus strictly on comfort measures moving forward.  She is very clear that she believes her mother would not want to continue with current measures including antibiotics and BiPAP.  She asked for time to call and discuss this with her brother.  I then called Dorothy Collins back and she reports that both she and her brother in agreement that her mother's wishes would be to focus strictly on comfort care moving forward. We discussed this at length including addition of medications for comfort and de-escalating care not related to her comfort. This included discontinuing antibiotics as well as BiPAP. Jeani Hawking reports that she feels this would be her mother's wish and wants to proceed with this as soon as possible.   HCPOA: Abe People, daughter   SUMMARY OF RECOMMENDATIONS   - I called and spoke with patient's daughter/HCPOA via phone.  She discussed with her brother and reports that they are in agreement that her mother's wishes moving forward would be to focus strictly on comfort.  - Will d/c interventions not in line with focus on  comfort.  This includes Bipap and antibiotics. - I updated her orders for focus on comfort using end of life order set. - This will likely be a terminal admission.  I anticipate her prognosis to be hours to days.  If her condition were to stabilize to the point of considering transition from the hospital, she would best be served by residential hospice.     Code Status/Advance Care Planning:  DNR   Symptom Management:   Pain/SOB: She has been getting morphine as needed.  Plan for morphine continuous infusion at 1 mg per hour. We will also order bolus dosing morphine 2 mg every 15 minutes as needed for shortness of breath or pain..  Anxiety: Ativan as needed  Agitation: haldol as needed  Fever: Tylenol as needed  Excess secretions: Robinul as  needed  Palliative Prophylaxis:   Aspiration and Frequent Pain Assessment  Additional Recommendations (Limitations, Scope, Preferences):  Full Comfort Care  Psycho-social/Spiritual:   Desire for further Chaplaincy support:no  Additional Recommendations: Grief/Bereavement Support  Prognosis:   Hours - Days most likely.  Discharge Planning: Anticipated Hospital Death vs residential hospice if her condition stabilizes      Primary Diagnoses: Present on Admission:  . UTI (lower urinary tract infection) . (Resolved) Sepsis ([place]) . Dehydration . Chronic back pain . Dyslipidemia . GERD (gastroesophageal reflux disease) . Depression . Vascular dementia without behavioral disturbance . Hypotension . Atrial fibrillation with rapid ventricular response ([place]) . Acute encephalopathy . Constipation . Hypokalemia . AKI (acute kidney injury) ([place]) . Staphylococcus aureus bacteremia with sepsis ([place]) . Thrombocytopenia ([place]) . Oral thrush . Obtundation  I have reviewed the medical record, interviewed the patient and family, and examined the patient. The following aspects are pertinent.  Past Medical History  Diagnosis Date  . Dementia   . CVA (cerebral vascular accident) ([place])   . COPD (chronic obstructive pulmonary disease) ([facility])     Emphysema  . GERD (gastroesophageal reflux disease)   . Glaucoma   . Hyperlipidemia   . Insomnia   . Rhinitis 12/14/1999  . Depression 10/12/2009  . Cancer ([place]) 07/18/2008    Breast cancer-Left  . Hypertension   . Arthritis 07/18/2008    osteoporosis  . Peripheral neuropathy ([place]) 07/18/2008  . Cerebrovascular disease 08/05/2003    acute  . Lumbago 08/05/2003  . Edema 08/05/2003  . Cellulitis and abscess of leg 08/05/2003  . Peripheral vascular disease ([place]) 08/05/2003  . Anxiety 08/05/2003  . Hypopotassemia 08/05/2003  . Anemia    Social History   Social History  . Marital Status: Widowed    Spouse Name: N/A  . Number of Children: N/A    . Years of Education: N/A   Social History Main Topics  . Smoking status: Former Smoker    Types: Cigarettes    Quit date: 03/16/1999  . Smokeless tobacco: None  . Alcohol Use: No  . Drug Use: No  . Sexual Activity: Not Asked   Other Topics Concern  . None   Social History Narrative   Family History  Problem Relation Age of Onset  . Hypertension Mother    Scheduled Meds: . amiodarone  400 mg Oral Daily  . antiseptic oral rinse  7 mL Mouth Rinse BID  . atorvastatin  20 mg Oral Daily  . budesonide (PULMICORT) nebulizer solution  0.25 mg Nebulization BID  . calcium-vitamin D  1 tablet Oral BID  .  ceFAZolin (ANCEF) IV  2 g Intravenous Q8H  . cholecalciferol  2,000  Units Oral Daily  . cycloSPORINE  1 drop Both Eyes BID  . DULoxetine  60 mg Oral Daily  . famotidine  20 mg Oral Q2000  . feeding supplement (PRO-STAT SUGAR FREE 64)  30 mL Oral BID  . furosemide  40 mg Intravenous Q12H  . hydrocortisone sod succinate (SOLU-CORTEF) inj  50 mg Intravenous Daily  . lactose free nutrition  237 mL Oral TID WC  . lamoTRIgine  150 mg Oral BID  . latanoprost  1 drop Right Eye QHS  . loratadine  10 mg Oral Daily  . memantine  5 mg Oral BID  . metoprolol tartrate  25 mg Oral BID  . omega-3 acid ethyl esters  2 g Oral BID  . potassium chloride  10 mEq Intravenous Q1 Hr x 3  . potassium chloride  40 mEq Oral Once  . rivaroxaban  15 mg Oral Q supper  . senna  1 tablet Oral BID  . sodium chloride flush  3 mL Intravenous Q12H   Continuous Infusions: . amiodarone 30 mg/hr (2015-08-25 1511)   PRN Meds:.acetaminophen **OR** acetaminophen, clonazePAM, hydrALAZINE, ipratropium, levalbuterol, meclizine, metoprolol, morphine injection, ondansetron **OR** ondansetron (ZOFRAN) IV, senna-docusate, sodium phosphate, sorbitol Medications Prior to Admission:  Prior to Admission medications   Medication Sig Start Date End Date Taking? Authorizing Provider  acetaminophen (TYLENOL) 650 MG CR tablet  Take 650 mg by mouth every 4 (four) hours as needed for pain or fever.   Yes Historical Provider, MD  Amino Acids-Protein Hydrolys (FEEDING SUPPLEMENT, PRO-STAT SUGAR FREE 64,) LIQD Take 30 mLs by mouth 2 (two) times daily.   Yes Historical Provider, MD  amiodarone (PACERONE) 200 MG tablet Take 200-400 mg by mouth as directed. Take 2 TABLETS BY MOUTH TWICE A DAY FOR 7 DAYS AND THEN 1 TABLET DAILY   Yes Historical Provider, MD  aspirin EC 81 MG tablet Take 81 mg by mouth daily.   Yes Historical Provider, MD  atorvastatin (LIPITOR) 20 MG tablet Take 20 mg by mouth daily.   Yes Historical Provider, MD  bimatoprost (LUMIGAN) 0.01 % SOLN Place 1 drop into the right eye at bedtime.   Yes Historical Provider, MD  budesonide-formoterol (SYMBICORT) 80-4.5 MCG/ACT inhaler Inhale 2 puffs into the lungs 2 (two) times daily. 06/06/15  Yes Florencia Reasons, MD  calcium-vitamin D (OSCAL WITH D) 500-200 MG-UNIT per tablet Take 1 tablet by mouth 2 (two) times daily.    Yes Historical Provider, MD  Cholecalciferol 2000 UNITS CAPS Take 2,000 Units by mouth daily.   Yes Historical Provider, MD  clonazePAM (KLONOPIN) 0.5 MG tablet Take 1 tablet (0.5 mg total) by mouth 3 (three) times daily as needed for anxiety. 09/07/13  Yes Kinnie Feil, MD  cycloSPORINE (RESTASIS) 0.05 % ophthalmic emulsion Place 1 drop into both eyes 2 (two) times daily.   Yes Historical Provider, MD  DULoxetine (CYMBALTA) 30 MG capsule Take 2 capsules (60 mg total) by mouth daily. Takes with 60 mg for total of 90mg  09/07/13  Yes Kinnie Feil, MD  fexofenadine (ALLEGRA) 180 MG tablet Take 180 mg by mouth daily.   Yes Historical Provider, MD  gabapentin (NEURONTIN) 400 MG capsule Take 1 capsule (400 mg total) by mouth 3 (three) times daily. 09/07/13  Yes Kinnie Feil, MD  ketotifen (ZADITOR) 0.025 % ophthalmic solution Place 1 drop into both eyes 2 (two) times daily. Wait 3-5 minutes between eye meds   Yes Historical Provider, MD  lamoTRIgine (LAMICTAL)  150 MG tablet Take  150 mg by mouth 2 (two) times daily.   Yes Historical Provider, MD  lidocaine (LIDODERM) 5 % Place 1 patch onto the skin daily. Apply to back  -  Remove & Discard patch within 12 hours or as directed by MD. On for 12 hours off for 12 hours   Yes Historical Provider, MD  meclizine (ANTIVERT) 25 MG tablet Take 25 mg by mouth every 6 (six) hours as needed for dizziness.    Yes Historical Provider, MD  memantine (NAMENDA) 5 MG tablet Take 5 mg by mouth 2 (two) times daily.   Yes Historical Provider, MD  metoprolol tartrate (LOPRESSOR) 25 MG tablet Take 12.5 mg by mouth 2 (two) times daily.   Yes Historical Provider, MD  Multiple Vitamins-Minerals (DECUBI-VITE PO) Take 1 tablet by mouth daily.   Yes Historical Provider, MD  Nutritional Supplements (BOOST PLUS PO) Take 1 Can by mouth 3 (three) times daily with meals.   Yes Historical Provider, MD  omega-3 acid ethyl esters (LOVAZA) 1 G capsule Take 2 g by mouth 2 (two) times daily.   Yes Historical Provider, MD  omeprazole (PRILOSEC) 20 MG capsule Take 40 mg by mouth 2 (two) times daily before a meal.    Yes Historical Provider, MD  Polyethyl Glycol-Propyl Glycol (SYSTANE OP) Place 1 drop into both eyes 4 (four) times daily.    Yes Historical Provider, MD  polyethylene glycol (MIRALAX / GLYCOLAX) packet Take 17 g by mouth daily.   Yes Historical Provider, MD  risperiDONE (RISPERDAL M-TABS) 1 MG disintegrating tablet Take 1 mg by mouth 2 (two) times daily.   Yes Historical Provider, MD  rivaroxaban (XARELTO) 20 MG TABS tablet Take 1 tablet (20 mg total) by mouth daily with supper. 01/27/15  Yes Skeet Latch, MD  senna (SENOKOT) 8.6 MG tablet Take 1 tablet by mouth 2 (two) times daily.    Yes Historical Provider, MD  traZODone (DESYREL) 50 MG tablet Take 50 mg by mouth at bedtime.   Yes Historical Provider, MD  umeclidinium bromide (INCRUSE ELLIPTA) 62.5 MCG/INH AEPB Inhale 1 puff into the lungs daily.   Yes Historical Provider, MD   ZOLMitriptan (ZOMIG) 2.5 MG tablet Take 2.5 mg by mouth as needed. For migraine   Yes Historical Provider, MD  doxycycline (VIBRAMYCIN) 100 MG capsule Take 1 capsule (100 mg total) by mouth 2 (two) times daily. Patient not taking: Reported on 09/04/2015 06/06/15   Florencia Reasons, MD  oseltamivir (TAMIFLU) 30 MG capsule Take 1 capsule (30 mg total) by mouth 2 (two) times daily. Patient not taking: Reported on 08/18/2015 06/06/15   Florencia Reasons, MD  tiotropium (SPIRIVA HANDIHALER) 18 MCG inhalation capsule Place 1 capsule (18 mcg total) into inhaler and inhale every morning. Patient not taking: Reported on 08/15/2015 06/06/15   Florencia Reasons, MD   Allergies  Allergen Reactions  . Lyrica [Pregabalin]     Unknown; listed on MAR  . Penicillins     Unknown; listed on MAR  . Sulfa Antibiotics     Unknown; listed on mar   Review of Systems   Unable to perform ROS: Patient unresponsive   Physical Exam  General: Lethargic, elderly, ill appearing, on bipap, does not respond to questions or verbal stimulation. opens eyes only partially to tactile stimulation HEENT: No bruits, no goiter, no JVD Heart: Tachycardic, Irregular. No murmur appreciated. Lungs: On bipap, fair air movement, rhonchi scattered throughout Abdomen: Soft, nontender, nondistended, positive bowel sounds.  Ext: No significant edema Neuro: Unable to assess  Vital Signs: BP 176/118 mmHg  Pulse 134  Temp(Src) 99.2 F (37.3 C) (Axillary)  Resp 18  Ht 5\' 7"  (1.702 m)  Wt 73.483 kg (162 lb)  BMI 25.37 kg/m2  SpO2 98% Pain Assessment: CPOT   Pain Score: 0-No pain   SpO2: SpO2: 98 % O2 Device:SpO2: 98 % O2 Flow Rate: .O2 Flow Rate (L/min): 6 L/min  IO: Intake/output summary:  Intake/Output Summary (Last 24 hours) at Sep 07, 2015 1528 Last data filed at 2015/09/07 1100  Gross per 24 hour  Intake    560 ml  Output   4575 ml  Net  -4015 ml    LBM: Last BM Date: 08/19/15 Baseline Weight: Weight: 66.9 kg (147 lb 7.8 oz) Most recent weight:  Weight: 73.483 kg (162 lb)     Palliative Assessment/Data: PPS 10%   Flowsheet Rows        Most Recent Value   Intake Tab    Referral Department  Hospitalist   Unit at Time of Referral  ICU   Palliative Care Primary Diagnosis  Sepsis/Infectious Disease   Date Notified  08/19/15   Palliative Care Type  New Palliative care   Reason for referral  Clarify Goals of Care   Date of Admission  08/19/2015   Date first seen by Palliative Care  2015/09/07   # of days Palliative referral response time  1 Day(s)   # of days IP prior to Palliative referral  3   Clinical Assessment    Palliative Performance Scale Score  10%   Pain Max last 24 hours  Not able to report   Pain Min Last 24 hours  Not able to report   Psychosocial & Spiritual Assessment    Palliative Care Outcomes    Patient/Family meeting held?  Yes   Who was at the meeting?  Daughter/HCPOA via phone   Palliative Care Outcomes  Clarified goals of care, Counseled regarding hospice, Changed to focus on comfort      Time In: 1410 Time Out: 1530 Time Total: 80 Greater than 50%  of this time was spent counseling and coordinating care related to the above assessment and plan.  Signed by: Micheline Rough, MD   Please contact Palliative Medicine Team phone at [phone] for questions and concerns.  For individual provider: See Shea Evans

## 2015-09-13 NOTE — Progress Notes (Signed)
Patient's breathing started to become more labored around 0130, Oxygen sat 88% on 3L. Respiratory was called to come and give a breathing treatment. Treatment did not seem to change patient's condition and so rapid was called and so was the hospitalist. New orders were given for a one time dose of lasix, chest xray, and to cut fluids to NS@10 . After lasix was given, patient had no change in breathing. On call NP was notified again and NP came up to assess patient. Orders were given for a one time neb treatment and for 2mg  of morphine. Will continue to monitor patient.

## 2015-09-13 NOTE — Progress Notes (Signed)
Rapid Response Event Note  Overview: [id number] Called to bedside to assess patient in afib, on Amiodarone gtt; decreased responsiveness, labored breathing.  Patient started on Amiodarone gtt overnight, given Lasix and diuresed 3L, but continues with increased work of breathing this morning.  Initial Focused Assessment: Patient lethargic, responsive to voice, but nonverbal.  HR on monitor 120-130s afib, Amiodarone gtt infusing.  SBP 160-170s.  MD and son at bedside.  Patient with increased work of breathing, O2 sats 90-94% on 4L nasal cannula.   Patient with temp of 100.1.    Interventions: Lasix 40mg  IV ordered per MD and given by bedside RN.  Tylenol suppository given per MD request.  MD requested for cardiology to be notified of patient persistent rapid HR, bedside RN obtained new orders for Metoprolol IV x1 from cardiology PA.  [facility] MD called patient's daughter with son at bedside.  Family members are firm that patient desires to be a DNR, but are open to trialing Bipap per MD suggestion.  Son states that his mother "has had a hard life the last 10 years and is tired".  Both son and daughter are open to a palliative care consult.  Orders placed for transfer to stepdown and Bipap, as well as palliative care consult.  Event Summary: Patient transferred to stepdown bed at 1020, and placed on Bipap by respiratory therapy.   Karren Burly

## 2015-09-13 NOTE — Progress Notes (Signed)
Patient minimally responsive upon assessment this AM.  Pt HR in 120s, BP elevated, resp 24, temp 100.1, 02 sats 92% on 6L with increased work of breathing.  Dr. Tyrell Antonio paged, on floor to see patient.  , Rapid response called, 40 mg IV lasix, morphine per existing order, tylenol suppository administered per Dr. Tyrell Antonio.  Cardiology paged per Dr. Tyrell Antonio, 5 mg IV metoprolol administered per [facility].  Patient transferred to unit for closer monitoring and BiPaP. Family aware, son at bedside and informed.

## 2015-09-13 NOTE — Progress Notes (Signed)
Patient ID: Naydelin B Moscato, female   DOB: 1931-01-21, 82 y.o.   MRN: [id number]         [facility]    Date of Admission:  08/25/2015    Total days of antibiotics 5        Day 4 cefazolin  Ms. Gerke's condition has continued to deteriorate overnight. She remains in atrial fibrillation but now has rapid ventricular response and worsening hypoxic respiratory failure. Her first set of repeat blood cultures on 08/17/2015 are growing MSSA again. I will continue cefazolin. I agree with palliative care consult.         Michel Bickers, MD [facility] [place] [facility] [phone] pager   [phone] cell 03/18/2015, 1:32 PM

## 2015-09-13 NOTE — Progress Notes (Signed)
PROGRESS NOTE    Dorothy Collins  [id number] DOB: 03/14/1931 DOA: 08/23/2015 PCP: Wenda Low, MD    Brief Narrative:  Patient is a 82 year old female nursing home resident with history of atrial fibrillation on chronic anticoagulation, recurrent UTIs, dementia, depression, who presents to the ED in septic shock secondary to UTI and MSSA bacteremia. Patient aggressively hydrated with IV fluids and placed on stress dose steroids with improvement in hypotension and mentation. ID following. Patient also noted to go into A. fib with RVR currently on oral amiodarone and oral metoprolol. Cardiology following.    Assessment & Plan:   Principal Problem:   Staphylococcus aureus bacteremia with sepsis ([place]) Active Problems:   Peripheral neuropathy (HCC)   Chronic back pain   Dyslipidemia   GERD (gastroesophageal reflux disease)   Constipation   Depression   Vascular dementia without behavioral disturbance   Hypotension   H/O: CVA (cerebrovascular accident)   AKI (acute kidney injury) ([place])   Atrial fibrillation with rapid ventricular response (HCC)   Hypokalemia   UTI (lower urinary tract infection)   Acute encephalopathy   Dehydration   Thrombocytopenia (HCC)   Oral thrush   Obtundation  #1 septic shock secondary to urinary tract infection and MSSA bacteremia  Blood cultures positive for MSSA bacteremia. Urine cultures with no significant growth. 2-D echo with EF of 50-55% with no wall motion abnormalities. No vegetations noted. Repeat blood cultures 08/17/2015 positive with staph Aureus.  Decrease stress dose steroids to Solu-Cortef 50 mg IV daily.  Continue empiric IV Ancef. Will need to repeat blood cultures in the next 1-2 days to ensure blood cultures are negative prior to PICC line placement. ID following.   #2 atrial fibrillation with RVR CHADS2Vasc SCORE 5/??? Tachybradycardia syndrome Likely secondary to problem #1. Patient was also noted to be lethargic on admission and  likely had missed doses of oral amiodarone which patient was recently started on per cardiology on 08/14/2015. Patient started back on amiodarone 400 mg twice a day 7 days and then 400 mg daily per cardiology recommendations from office visit of 08/14/2015. Patient noted to have an episode of obtundation the morning of 08/18/2015, with heart rate dropping as low as 35 and then coming back up. 2-D echo with EF of 50-55% with no wall motion abnormalities.  Patient's INR was 2.64 on 08/15/2015. Patient now with a thrombocytopenia with a platelet count slowly improving and now at 111 from 72 from 68 and as such patient's anticoagulation of Xarelto was held. Heart rate still elevated and patient has been started on metoprolol as well for better rate control.  On anticoagulation with Xarelto and monitor platelet count closely and monitor for bleeding. Per cardiology.  A fib with RVR overnight, started on amiodarone Gtt. HR not controlled. Cardiology will be informed.    #3 Acute Hypoxic Respiratory Failure;  Chest x ray with Pulmonary edema, and atelectasis vs infiltrates.  Suspect HF exacerbation.  IV lasix 40 Mg IV BID>  Discussed with Daughter ok to try CPAP for increase work of breathing. Ok to use morphine also.  Palliative care consulted.   4-thrombocytopenia Likely secondary to acute infection of sepsis. Patient with no overt bleeding. Platelets slowly trending back up. Discontinued subcutaneous heparin. Will resume Xarelto monitor closely. Follow.  #5 acute kidney injury Likely secondary to a prerenal azotemia. Urine sodium was less than 10. Urine protein was 30. Renal function improved with hydration. Resolved.   #6 acute metabolic encephalopathy Secondary to problem #1. Patient alert answering  questions appropriately. Clinical improvement. Continue empiric IV antibiotics. Worsening in setting of hypoxemia , HF,   #7 hypokalemia/hypomagnesemia Replete.   #8 dehydration Stop IV  fluids.   #9 oral thrush Continue Diflucan.   #10 gastroesophageal reflux disease Continue Pepcid.  #11 depression Continue Cymbalta, Lamictal.  #12 vascular dementia Continue risperdal and namenda.   obtundation Patient noted to have a brief episode of obtundation this morning were heart rate was noted to come down to 35. Patient also noted to have a large bowel movement per nurse and at that time. Heart rate improved back to the low 100s. Patient does have a history of A. fib and currently on amiodarone. Episode of obtundation could likely be vasovagal however due to cardiac history and heart rate of 35, cardiology was consulted. Per cardiology who reviewed telemetry strips reveal no significant bradycardia or pauses noted and telemetry strips have extremely low voltage and suspect computer was not sent at the sensitivity which will allow it to recognize small QRS complexes.   DVT prophylaxis: SCDs Code Status: DO NOT RESUSCITATE Family Communication: sister over phone, and son at bedside.  Disposition Plan: transfer to step down unit,    Consultants:   Infectious diseases: Dr. Lucianne Lei dam 08/17/2015  Cardiology : Dr. Claiborne Billings 08/18/2015  Procedures:   Chest x-ray 09/10/2015, 08/17/2015  2-D echo 08/18/2015  Antimicrobials:   IV cefepime 09/12/2015>>>>>> 08/17/2015  IV vancomycin 08/17/2015>>>>> 08/17/2015  IV Ancef 08/17/2015   Subjective: Events from overnight notice.  Patient with increase work of breathing, open eyes to voice, fall back to sleep.   Objective: Filed Vitals:   2015-08-22 0242 [place] 09, 2017 0316 Aug 22, 2015 0510 Aug 22, 2015 0856  BP:   151/87   Pulse:   118 121  Temp:   98.5 F (36.9 C)   TempSrc:   Oral   Resp:   24 28  Height:      Weight:   73.483 kg (162 lb)   SpO2: 89% 98% 94% 92%    Intake/Output Summary (Last 24 hours) at [place] 09, 2017 0949 Last data filed at 08-22-15 0600  Gross per 24 hour  Intake   1350 ml  Output   3395 ml  Net  -2045 ml    Filed Weights   08/18/15 0423 08/19/15 0500 22-Aug-2015 0510  Weight: 71.5 kg (157 lb 10.1 oz) 73.1 kg (161 lb 2.5 oz) 73.483 kg (162 lb)    Examination:  General exam: mild distress Respiratory system: labor breathing, bilateral crackles.  Cardiovascular system: Irregularly irregular. No JVD, murmurs, rubs, gallops or clicks. No pedal edema. Gastrointestinal system: Abdomen is nondistended, soft and nontender. No organomegaly or masses felt. Normal bowel sounds heard. Central nervous system: open eyes to voice, fall back to sleep.  Extremities: Symmetric 5 x 5= unable to assess.  Oropharynx: Upper gums with improved thrush noted.     Data Reviewed: I have personally reviewed following labs and imaging studies  CBC:  Recent Labs Lab 08/25/2015 1255 08/17/15 0334 08/18/15 0354 08/19/15 0328 08/22/2015 0522  WBC 7.5 3.5* 5.0 8.5 9.8  NEUTROABS 6.1 3.1 4.1  --  8.1*  HGB 12.3 10.6* 10.1* 10.7* 10.6*  HCT 36.7 32.5* 30.8* 32.5* 31.9*  MCV 88.4 92.1 88.8 90.0 88.6  PLT 94* 68* 72* 111* 123456*   Basic Metabolic Panel:  Recent Labs Lab 08/19/2015 1543 09/08/2015 1835 08/17/15 0334 08/18/15 0354 08/19/15 0328 [place] 09, 2017 0522  NA 132*  --  138 137 139 141  K 3.2*  --  4.1 3.8 3.4* 3.0*  CL 102  --  112* 111 110 107  CO2 21*  --  20* 20* 20* 23  GLUCOSE 167*  --  117* 156* 123* 156*  BUN 50*  --  34* 22* 17 16  CREATININE 1.30*  --  0.86 0.59 0.53 0.66  CALCIUM 7.3*  --  7.1* 7.7* 8.1* 8.5*  MG  --  1.6*  --  2.3  --   --    GFR: Estimated Creatinine Clearance: 52.7 mL/min (by C-G formula based on Cr of 0.66). Liver Function Tests:  Recent Labs Lab 09/10/2015 1543 08/17/15 0334 08/18/15 0354  AST 60* 61* 44*  ALT 30 33 32  ALKPHOS 64 73 75  BILITOT 0.6 0.5 0.4  PROT 5.3* 5.4* 5.5*  ALBUMIN 2.2* 2.2* 2.3*   No results for input(s): LIPASE, AMYLASE in the last 168 hours. No results for input(s): AMMONIA in the last 168 hours. Coagulation Profile:  Recent Labs Lab  08/21/2015 1818  INR 2.64*   Cardiac Enzymes: No results for input(s): CKTOTAL, CKMB, CKMBINDEX, TROPONINI in the last 168 hours. BNP (last 3 results) No results for input(s): PROBNP in the last 8760 hours. HbA1C: No results for input(s): HGBA1C in the last 72 hours. CBG:  Recent Labs Lab 08/17/15 0800 08/18/15 0649 08/18/15 0736 08/19/15 0730 Sep 05, 2015 0751  GLUCAP 110* 155* 159* 119* 150*   Lipid Profile: No results for input(s): CHOL, HDL, LDLCALC, TRIG, CHOLHDL, LDLDIRECT in the last 72 hours. Thyroid Function Tests:  Recent Labs  2015-09-05 0522  TSH 1.216   Anemia Panel: No results for input(s): VITAMINB12, FOLATE, FERRITIN, TIBC, IRON, RETICCTPCT in the last 72 hours. Sepsis Labs:  Recent Labs Lab 08/23/2015 1322 08/15/2015 1556 09/06/2015 1818  PROCALCITON  --   --  1.02  LATICACIDVEN 2.74* 1.37  --     Recent Results (from the past 240 hour(s))  Culture, blood (Routine x 2)     Status: Abnormal   Collection Time: 08/14/2015  1:10 PM  Result Value Ref Range Status   Specimen Description BLOOD LEFT ANTECUBITAL  Final   Special Requests BOTTLES DRAWN AEROBIC AND ANAEROBIC 1 CC EACH  Final   Culture  Setup Time   Final    IN BOTH AEROBIC AND ANAEROBIC BOTTLES GRAM POSITIVE COCCI IN CLUSTERS CRITICAL RESULT CALLED TO, READ BACK BY AND VERIFIED WITH: N GLOGOVAC,PHARMD AT [id number] 08/17/15 BY L BENFIELD Performed at [facility] (A)  Final   Report Status 08/19/2015 FINAL  Final   Organism ID, Bacteria STAPHYLOCOCCUS AUREUS  Final      Susceptibility   Staphylococcus aureus - MIC*    CIPROFLOXACIN <=0.5 SENSITIVE Sensitive     ERYTHROMYCIN <=0.25 SENSITIVE Sensitive     GENTAMICIN <=0.5 SENSITIVE Sensitive     OXACILLIN 0.5 SENSITIVE Sensitive     TETRACYCLINE <=1 SENSITIVE Sensitive     VANCOMYCIN 1 SENSITIVE Sensitive     TRIMETH/SULFA <=10 SENSITIVE Sensitive     CLINDAMYCIN <=0.25 SENSITIVE Sensitive     RIFAMPIN <=0.5  SENSITIVE Sensitive     Inducible Clindamycin NEGATIVE Sensitive     * STAPHYLOCOCCUS AUREUS  Blood Culture ID Panel (Reflexed)     Status: Abnormal   Collection Time: 09/06/2015  1:10 PM  Result Value Ref Range Status   Enterococcus species NOT DETECTED NOT DETECTED Final   Vancomycin resistance NOT DETECTED NOT DETECTED Final   Listeria monocytogenes NOT DETECTED NOT DETECTED Final   Staphylococcus species DETECTED (A)  NOT DETECTED Final    Comment: CRITICAL RESULT CALLED TO, READ BACK BY AND VERIFIED WITH: N GLOGOVAC,PHARMD AT 0735 08/17/15 BY L BENFIELD    Staphylococcus aureus DETECTED (A) NOT DETECTED Final    Comment: CRITICAL RESULT CALLED TO, READ BACK BY AND VERIFIED WITH: N GLOGOVAC,PHARMD AT 0735 08/17/15 BY L BENFIELD    Methicillin resistance NOT DETECTED NOT DETECTED Final   Streptococcus species NOT DETECTED NOT DETECTED Final   Streptococcus agalactiae NOT DETECTED NOT DETECTED Final   Streptococcus pneumoniae NOT DETECTED NOT DETECTED Final   Streptococcus pyogenes NOT DETECTED NOT DETECTED Final   Acinetobacter baumannii NOT DETECTED NOT DETECTED Final   Enterobacteriaceae species NOT DETECTED NOT DETECTED Final   Enterobacter cloacae complex NOT DETECTED NOT DETECTED Final   Escherichia coli NOT DETECTED NOT DETECTED Final   Klebsiella oxytoca NOT DETECTED NOT DETECTED Final   Klebsiella pneumoniae NOT DETECTED NOT DETECTED Final   Proteus species NOT DETECTED NOT DETECTED Final   Serratia marcescens NOT DETECTED NOT DETECTED Final   Carbapenem resistance NOT DETECTED NOT DETECTED Final   Haemophilus influenzae NOT DETECTED NOT DETECTED Final   Neisseria meningitidis NOT DETECTED NOT DETECTED Final   Pseudomonas aeruginosa NOT DETECTED NOT DETECTED Final   Candida albicans NOT DETECTED NOT DETECTED Final   Candida glabrata NOT DETECTED NOT DETECTED Final   Candida krusei NOT DETECTED NOT DETECTED Final   Candida parapsilosis NOT DETECTED NOT DETECTED Final    Candida tropicalis NOT DETECTED NOT DETECTED Final    Comment: Performed at [facility]  Culture, blood (Routine x 2)     Status: Abnormal   Collection Time: 08/23/2015  1:50 PM  Result Value Ref Range Status   Specimen Description BLOOD RIGHT HAND  Final   Special Requests IN PEDIATRIC BOTTLE 3 CC  Final   Culture  Setup Time   Final    PED BOTTLE GRAM POSITIVE COCCI IN CLUSTERS CRITICAL RESULT CALLED TO, READ BACK BY AND VERIFIED WITH: N GLOGOVAC,PARMD AT 0735 08/17/15 BY L BENFIELD    Culture (A)  Final    STAPHYLOCOCCUS AUREUS SUSCEPTIBILITIES PERFORMED ON PREVIOUS CULTURE WITHIN THE LAST 5 DAYS. Performed at [facility]    Report Status 08/19/2015 FINAL  Final  Urine culture     Status: Abnormal   Collection Time: 08/15/2015  3:19 PM  Result Value Ref Range Status   Specimen Description URINE, CATHETERIZED  Final   Special Requests NONE  Final   Culture (A)  Final    <10,000 COLONIES/mL INSIGNIFICANT GROWTH Performed at [facility]    Report Status 08/17/2015 FINAL  Final  MRSA PCR Screening     Status: None   Collection Time: 08/19/2015  6:19 PM  Result Value Ref Range Status   MRSA by PCR NEGATIVE NEGATIVE Final    Comment:        The GeneXpert MRSA Assay (FDA approved for NASAL specimens only), is one component of a comprehensive MRSA colonization surveillance program. It is not intended to diagnose MRSA infection nor to guide or monitor treatment for MRSA infections.   Culture, blood (Routine X 2) w Reflex to ID Panel     Status: None (Preliminary result)   Collection Time: 08/17/15  2:20 PM  Result Value Ref Range Status   Specimen Description BLOOD RIGHT ANTECUBITAL  Final   Special Requests BOTTLES DRAWN AEROBIC AND ANAEROBIC 5CC  Final   Culture   Final    NO GROWTH 2 DAYS Performed  at [facility]    Report Status PENDING  Incomplete  Culture, blood (Routine X 2) w Reflex to ID Panel     Status: Abnormal (Preliminary result)    Collection Time: 08/17/15  2:20 PM  Result Value Ref Range Status   Specimen Description BLOOD RIGHT HAND  Final   Special Requests IN PEDIATRIC BOTTLE 4CC  Final   Culture  Setup Time   Final    GRAM POSITIVE COCCI IN CLUSTERS AEROBIC BOTTLE ONLY CRITICAL RESULT CALLED TO, READ BACK BY AND VERIFIED WITH: A. RUNYON, PHARMACY AT 1054 ON [id number] BY Rhea Bleacher    Culture (A)  Final    STAPHYLOCOCCUS AUREUS SUSCEPTIBILITIES PERFORMED ON PREVIOUS CULTURE WITHIN THE LAST 5 DAYS. Performed at [facility]    Report Status PENDING  Incomplete         Radiology Studies: Dg Chest Port 1 View  09/11/2015  CLINICAL DATA:  82 year old female with shortness of breath EXAM: PORTABLE CHEST 1 VIEW COMPARISON:  Chest radiograph dated 08/17/2015 FINDINGS: Single portable view of the chest demonstrates mild cardiomegaly with increased vascular prominence compatible with mild congestive changes. Bibasilar hazy densities may represent atelectatic changes or pneumonia. Clinical correlation is recommended. Trace bilateral pleural effusions may be present. There is atherosclerotic calcification of the aortic arch. No acute osseous pathology identified. IMPRESSION: Mild cardiomegaly and congestive changes. Bibasilar atelectasis versus pneumonia. Electronically Signed   By: Anner Crete M.D.   On: 09/11/2015 02:32        Scheduled Meds: . amiodarone  400 mg Oral Daily  . antiseptic oral rinse  7 mL Mouth Rinse BID  . atorvastatin  20 mg Oral Daily  . budesonide (PULMICORT) nebulizer solution  0.25 mg Nebulization BID  . calcium-vitamin D  1 tablet Oral BID  .  ceFAZolin (ANCEF) IV  2 g Intravenous Q8H  . cholecalciferol  2,000 Units Oral Daily  . cycloSPORINE  1 drop Both Eyes BID  . DULoxetine  60 mg Oral Daily  . famotidine  20 mg Oral Q2000  . feeding supplement (PRO-STAT SUGAR FREE 64)  30 mL Oral BID  . fluconazole  100 mg Oral Daily  . furosemide  40 mg Intravenous Q12H  .  hydrocortisone sod succinate (SOLU-CORTEF) inj  50 mg Intravenous Daily  . lactose free nutrition  237 mL Oral TID WC  . lamoTRIgine  150 mg Oral BID  . latanoprost  1 drop Right Eye QHS  . loratadine  10 mg Oral Daily  . memantine  5 mg Oral BID  . metoprolol tartrate  25 mg Oral BID  . omega-3 acid ethyl esters  2 g Oral BID  . rivaroxaban  15 mg Oral Q supper  . senna  1 tablet Oral BID  . sodium chloride flush  3 mL Intravenous Q12H   Continuous Infusions: . amiodarone 30 mg/hr (09/11/2015 0720)     LOS: 4 days    Time spent: 40 minutes    Elmarie Shiley, MD [facility] Pager [phone]  If 7PM-7AM, please contact night-coverage [email or web address] Password [facility] 09-11-15, 9:49 AM

## 2015-09-13 NOTE — Care Management Important Message (Signed)
Important Message  Patient Details  Name: Dorothy Collins MRN: [id number] Date of Birth: 1930-05-03   Medicare Important Message Given:  Yes    Camillo Flaming 09-02-2015, 10:22 AMImportant Message  Patient Details  Name: Dorothy Collins MRN: [id number] Date of Birth: 1930/10/03   Medicare Important Message Given:  Yes    Camillo Flaming Divya 20, 2017, 10:22 AM

## 2015-09-13 NOTE — Progress Notes (Signed)
  Amiodarone Drug - Drug Interaction Consult Note  Recommendations: D/C fluconazole.  If treatment of thrush is necessary, recommend nystatin 500,000 units swish and swallow QID when mental status allows.  Amiodarone is metabolized by the cytochrome P450 system and therefore has the potential to cause many drug interactions. Amiodarone has an average plasma half-life of 50 days (range 20 to 100 days).   There is potential for drug interactions to occur several weeks or months after stopping treatment and the onset of drug interactions may be slow after initiating amiodarone.   []  Statins: Increased risk of myopathy. Simvastatin- restrict dose to 20mg  daily. Other statins: counsel patients to report any muscle pain or weakness immediately.  []  Anticoagulants: Amiodarone can increase anticoagulant effect. Consider warfarin dose reduction. Patients should be monitored closely and the dose of anticoagulant altered accordingly, remembering that amiodarone levels take several weeks to stabilize.  []  Antiepileptics: Amiodarone can increase plasma concentration of phenytoin, the dose should be reduced. Note that small changes in phenytoin dose can result in large changes in levels. Monitor patient and counsel on signs of toxicity.  []  Beta blockers: increased risk of bradycardia, AV block and myocardial depression. Sotalol - avoid concomitant use.  []   Calcium channel blockers (diltiazem and verapamil): increased risk of bradycardia, AV block and myocardial depression.  []   Cyclosporine: Amiodarone increases levels of cyclosporine. Reduced dose of cyclosporine is recommended.  []  Digoxin dose should be halved when amiodarone is started.  []  Diuretics: increased risk of cardiotoxicity if hypokalemia occurs.  []  Oral hypoglycemic agents (glyburide, glipizide, glimepiride): increased risk of hypoglycemia. Patient's glucose levels should be monitored closely when initiating amiodarone therapy.    [x]  Drugs that prolong the QT interval:  Torsades de pointes risk may be increased with concurrent use - avoid if possible.  Monitor QTc, also keep magnesium/potassium WNL if concurrent therapy can't be avoided. Marland Kitchen Antibiotics: e.g. fluoroquinolones, erythromycin, fluconazole . Antiarrhythmics: e.g. quinidine, procainamide, disopyramide, sotalol. . Antipsychotics: e.g. phenothiazines, haloperidol.  . Lithium, tricyclic antidepressants, and methadone.  Thank You,  Peggyann Juba, PharmD, BCPS Pager: [phone] 09-14-2015 11:37 AM

## 2015-09-13 NOTE — Progress Notes (Addendum)
Rapid Response Event Note  Overview:  Rapid response nurse called for increased work of breathing.     Initial Focused Assessment:  RR 24, oxygen saturation 88% on 2L [place], wheezy. Chronic afib HR 120-140s on amiodarone gtt.  Patient 5 liters positive since admission.  Interventions: [facility] NP called and order placed for chest xray and lasix 40 mg IV. Patient still has increased work of breathing post lasix and patient on 6L [place] to maintain O2 sats of 92% , NP notified and in route to examine patient.  Event Summary: Patient daughter and son notified by Lamar Blinks, NP on change in patient condition. Patient to remain on current unit.      Silvana Newness

## 2015-09-13 NOTE — Progress Notes (Signed)
Rt placed pt on BIPAP/NIV due to increase WOB.

## 2015-09-13 NOTE — Progress Notes (Signed)
Rt took pt off BIPAP/NIV for comfort care only. RN at bedside. Pt placed on 2 LPM [place].

## 2015-09-13 DEATH — deceased

## 2017-05-23 IMAGING — CR DG CHEST 2V
2 series · 2 of 2 positions shown · non-contrast
Comparison: 06/02/2015

CLINICAL DATA: Fever, [place] lethargy and hypotension for 2 days.

EXAM:
CHEST  2 VIEW

[w chest lat]
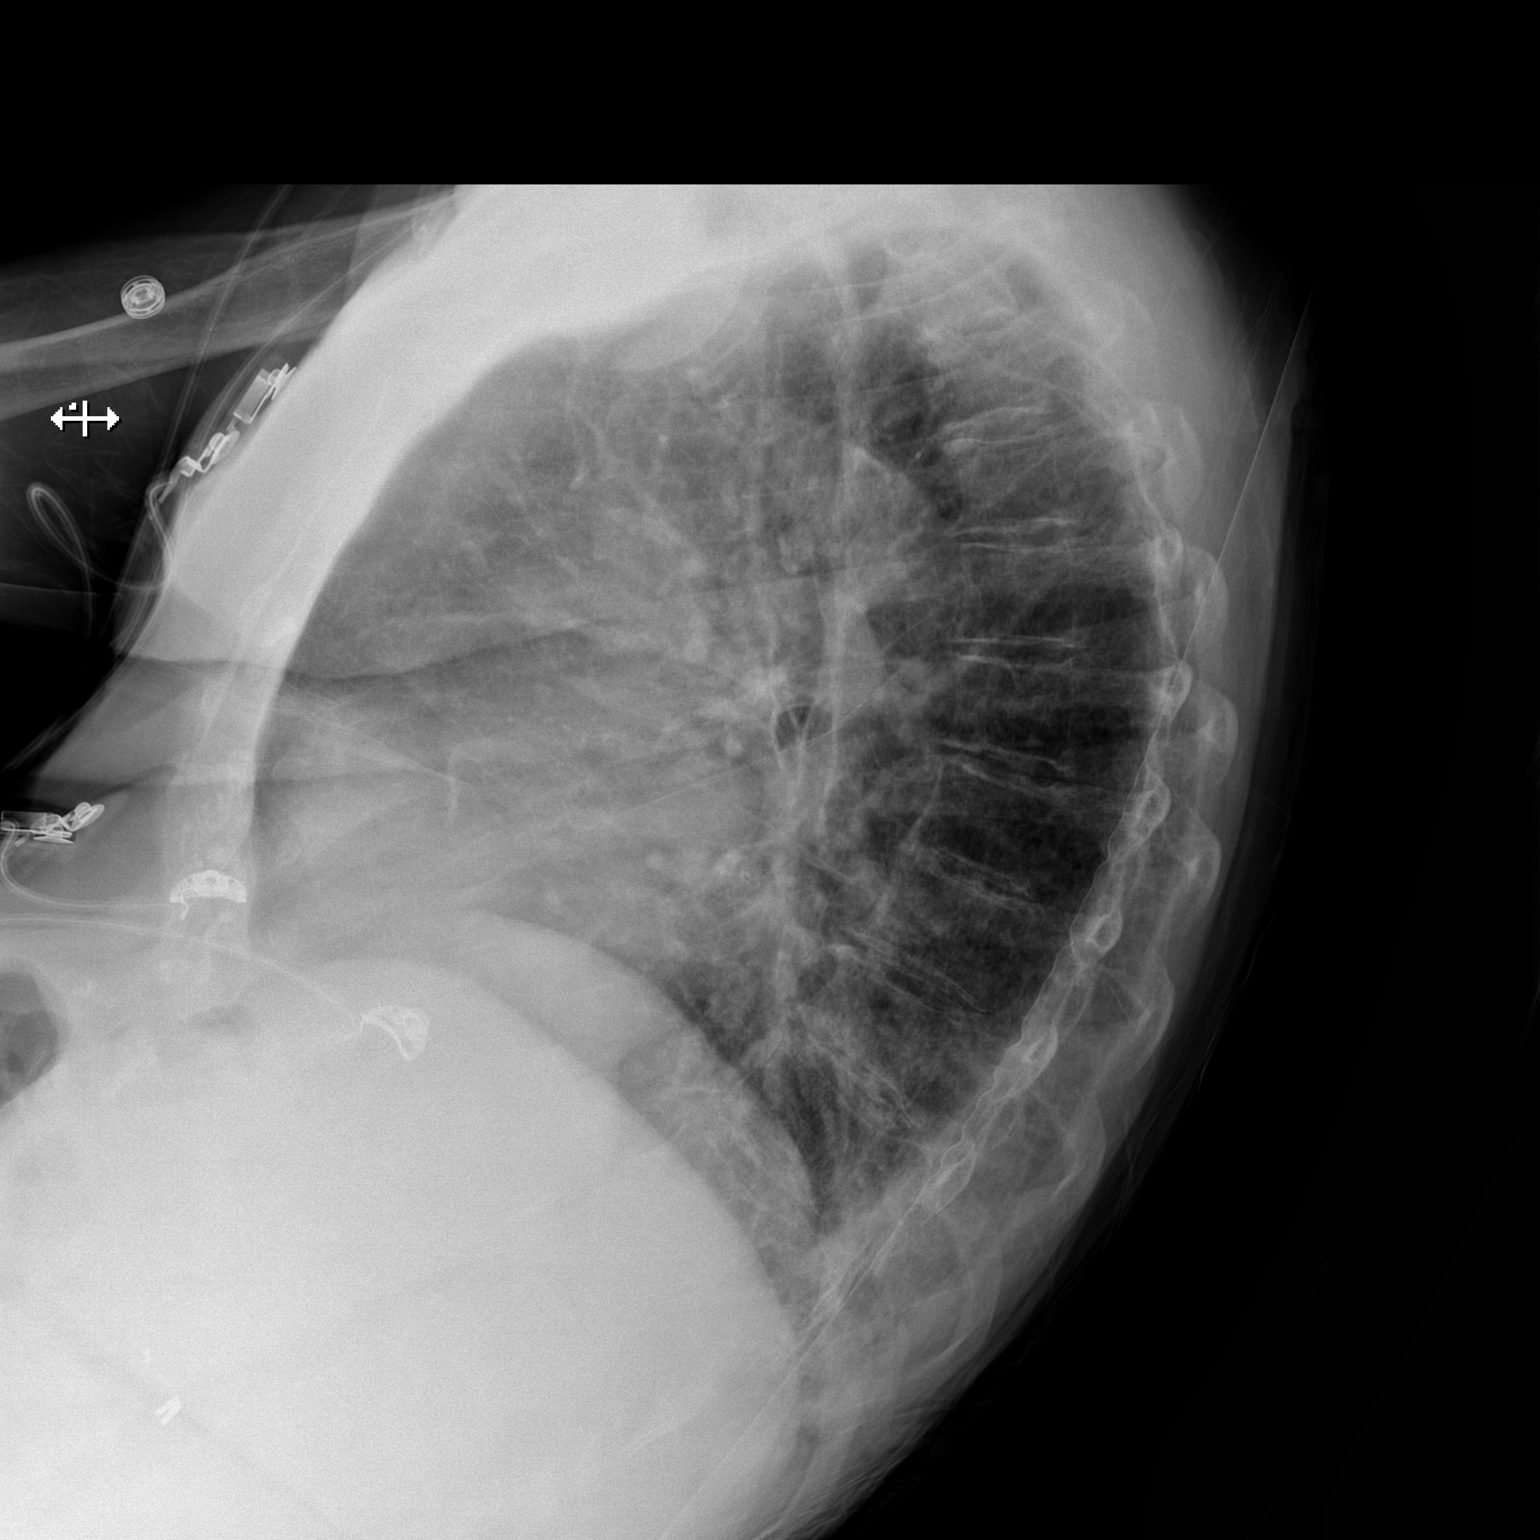

[x chest ap]
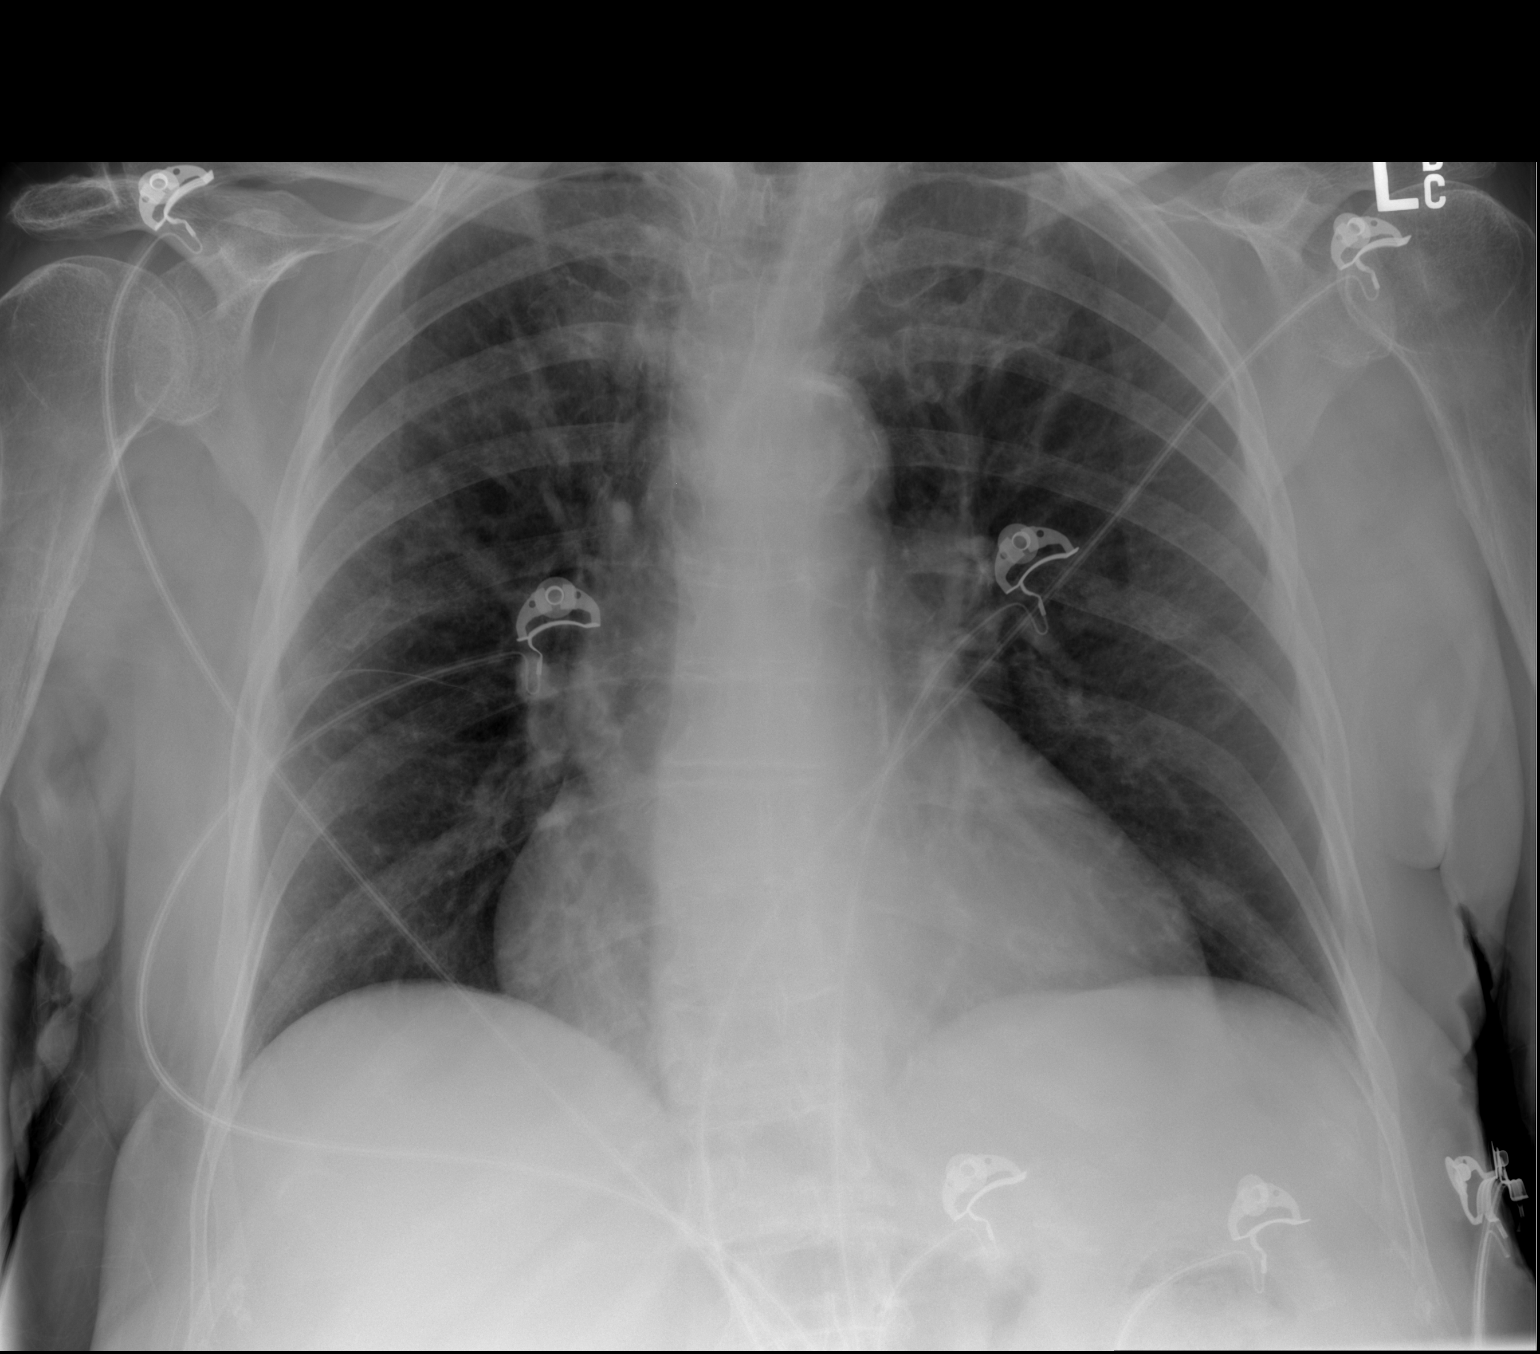

[2 of 2 positions shown; findings below may reference images not displayed]

FINDINGS: Mild cardiomegaly. Heart and mediastinal contours are within normal
limits. No focal opacities or effusions. No acute bony abnormality.
IMPRESSION: Mild cardiomegaly.  No active cardiopulmonary disease.
